# Patient Record
Sex: Male | Born: 1945 | Race: White | Hispanic: No | Marital: Married | Smoking: Never smoker
Health system: Southern US, Community
[De-identification: ages and names within clinical notes are randomized; demographics above are authoritative.]

## PROBLEM LIST (undated history)

## (undated) DIAGNOSIS — K219 Gastro-esophageal reflux disease without esophagitis: Secondary | ICD-10-CM

## (undated) DIAGNOSIS — G8929 Other chronic pain: Secondary | ICD-10-CM

## (undated) DIAGNOSIS — C4491 Basal cell carcinoma of skin, unspecified: Secondary | ICD-10-CM

## (undated) DIAGNOSIS — K5790 Diverticulosis of intestine, part unspecified, without perforation or abscess without bleeding: Secondary | ICD-10-CM

## (undated) DIAGNOSIS — M25562 Pain in left knee: Secondary | ICD-10-CM

## (undated) DIAGNOSIS — Z96659 Presence of unspecified artificial knee joint: Secondary | ICD-10-CM

## (undated) DIAGNOSIS — R0789 Other chest pain: Secondary | ICD-10-CM

## (undated) DIAGNOSIS — I10 Essential (primary) hypertension: Secondary | ICD-10-CM

## (undated) DIAGNOSIS — E785 Hyperlipidemia, unspecified: Secondary | ICD-10-CM

## (undated) DIAGNOSIS — N401 Enlarged prostate with lower urinary tract symptoms: Secondary | ICD-10-CM

## (undated) DIAGNOSIS — M1990 Unspecified osteoarthritis, unspecified site: Secondary | ICD-10-CM

## (undated) DIAGNOSIS — K648 Other hemorrhoids: Secondary | ICD-10-CM

## (undated) DIAGNOSIS — G4733 Obstructive sleep apnea (adult) (pediatric): Secondary | ICD-10-CM

## (undated) DIAGNOSIS — M25561 Pain in right knee: Secondary | ICD-10-CM

## (undated) DIAGNOSIS — E291 Testicular hypofunction: Secondary | ICD-10-CM

## (undated) DIAGNOSIS — G894 Chronic pain syndrome: Secondary | ICD-10-CM

## (undated) DIAGNOSIS — I480 Paroxysmal atrial fibrillation: Secondary | ICD-10-CM

## (undated) DIAGNOSIS — M1712 Unilateral primary osteoarthritis, left knee: Secondary | ICD-10-CM

## (undated) DIAGNOSIS — M545 Low back pain: Secondary | ICD-10-CM

## (undated) DIAGNOSIS — Z96652 Presence of left artificial knee joint: Secondary | ICD-10-CM

## (undated) DIAGNOSIS — M47816 Spondylosis without myelopathy or radiculopathy, lumbar region: Secondary | ICD-10-CM

## (undated) DIAGNOSIS — R519 Headache, unspecified: Secondary | ICD-10-CM

## (undated) DIAGNOSIS — R51 Headache: Secondary | ICD-10-CM

## (undated) DIAGNOSIS — N138 Other obstructive and reflux uropathy: Secondary | ICD-10-CM

## (undated) DIAGNOSIS — G43909 Migraine, unspecified, not intractable, without status migrainosus: Secondary | ICD-10-CM

## (undated) DIAGNOSIS — R05 Cough: Secondary | ICD-10-CM

## (undated) HISTORY — DX: Testicular hypofunction: E29.1

## (undated) HISTORY — DX: Pain in right knee: M25.561

## (undated) HISTORY — DX: Presence of unspecified artificial knee joint: Z96.659

## (undated) HISTORY — DX: Benign prostatic hyperplasia with lower urinary tract symptoms: N13.8

## (undated) HISTORY — PX: OTHER SURGICAL HISTORY: SHX169

## (undated) HISTORY — DX: Other chronic pain: G89.29

## (undated) HISTORY — DX: Other obstructive and reflux uropathy: N40.1

## (undated) HISTORY — DX: Other chest pain: R07.89

## (undated) HISTORY — DX: Unilateral primary osteoarthritis, left knee: M17.12

## (undated) HISTORY — PX: JOINT REPLACEMENT: SHX530

## (undated) HISTORY — DX: Hyperlipidemia, unspecified: E78.5

## (undated) HISTORY — PX: CARPAL TUNNEL RELEASE: SHX101

## (undated) HISTORY — DX: Spondylosis without myelopathy or radiculopathy, lumbar region: M47.816

## (undated) HISTORY — DX: Paroxysmal atrial fibrillation: I48.0

## (undated) HISTORY — PX: SHOULDER SURGERY: SHX246

## (undated) HISTORY — DX: Gastro-esophageal reflux disease without esophagitis: K21.9

## (undated) HISTORY — PX: KNEE SURGERY: SHX244

## (undated) HISTORY — DX: Pain in left knee: M25.562

## (undated) HISTORY — DX: Other hemorrhoids: K64.8

## (undated) HISTORY — PX: KNEE ARTHROPLASTY: SHX992

## (undated) HISTORY — DX: Cough: R05

## (undated) HISTORY — DX: Low back pain: M54.5

## (undated) HISTORY — DX: Diverticulosis of intestine, part unspecified, without perforation or abscess without bleeding: K57.90

## (undated) HISTORY — DX: Basal cell carcinoma of skin, unspecified: C44.91

## (undated) HISTORY — DX: Chronic pain syndrome: G89.4

## (undated) HISTORY — DX: Unspecified osteoarthritis, unspecified site: M19.90

## (undated) HISTORY — PX: HIP ARTHROPLASTY: SHX981

## (undated) HISTORY — PX: TOTAL KNEE ARTHROPLASTY: SHX125

## (undated) HISTORY — DX: Presence of left artificial knee joint: Z96.652

## (undated) HISTORY — DX: Obstructive sleep apnea (adult) (pediatric): G47.33

## (undated) HISTORY — DX: Migraine, unspecified, not intractable, without status migrainosus: G43.909

---

## 1998-06-17 ENCOUNTER — Other Ambulatory Visit: Admission: RE | Admit: 1998-06-17 | Discharge: 1998-06-17 | Payer: Self-pay | Admitting: Dermatology

## 2002-07-22 ENCOUNTER — Ambulatory Visit (HOSPITAL_BASED_OUTPATIENT_CLINIC_OR_DEPARTMENT_OTHER): Admission: RE | Admit: 2002-07-22 | Discharge: 2002-07-22 | Payer: Self-pay | Admitting: Orthopaedic Surgery

## 2003-10-05 ENCOUNTER — Ambulatory Visit (HOSPITAL_COMMUNITY): Admission: RE | Admit: 2003-10-05 | Discharge: 2003-10-05 | Payer: Self-pay | Admitting: Orthopaedic Surgery

## 2003-10-05 ENCOUNTER — Encounter: Payer: Self-pay | Admitting: Orthopaedic Surgery

## 2004-06-01 ENCOUNTER — Encounter: Admission: RE | Admit: 2004-06-01 | Discharge: 2004-06-01 | Payer: Self-pay | Admitting: Orthopaedic Surgery

## 2004-10-18 ENCOUNTER — Inpatient Hospital Stay (HOSPITAL_COMMUNITY): Admission: RE | Admit: 2004-10-18 | Discharge: 2004-10-21 | Payer: Self-pay | Admitting: Orthopaedic Surgery

## 2005-04-20 ENCOUNTER — Ambulatory Visit: Payer: Self-pay | Admitting: Internal Medicine

## 2005-05-04 ENCOUNTER — Ambulatory Visit: Payer: Self-pay | Admitting: Internal Medicine

## 2006-10-18 ENCOUNTER — Ambulatory Visit: Admission: RE | Admit: 2006-10-18 | Discharge: 2006-10-18 | Payer: Self-pay | Admitting: Orthopaedic Surgery

## 2006-11-02 ENCOUNTER — Inpatient Hospital Stay (HOSPITAL_COMMUNITY): Admission: RE | Admit: 2006-11-02 | Discharge: 2006-11-05 | Payer: Self-pay | Admitting: Orthopaedic Surgery

## 2007-04-01 ENCOUNTER — Encounter: Admission: RE | Admit: 2007-04-01 | Discharge: 2007-04-01 | Payer: Self-pay | Admitting: Orthopaedic Surgery

## 2010-04-25 ENCOUNTER — Encounter (INDEPENDENT_AMBULATORY_CARE_PROVIDER_SITE_OTHER): Payer: Self-pay | Admitting: *Deleted

## 2010-06-14 ENCOUNTER — Encounter (INDEPENDENT_AMBULATORY_CARE_PROVIDER_SITE_OTHER): Payer: Self-pay | Admitting: *Deleted

## 2010-06-17 ENCOUNTER — Ambulatory Visit: Payer: Self-pay | Admitting: Internal Medicine

## 2010-06-21 ENCOUNTER — Ambulatory Visit: Payer: Self-pay | Admitting: Internal Medicine

## 2011-01-24 NOTE — Letter (Signed)
Summary: Colonoscopy Letter  [facility]  [address] [place], [place] [address]   Phone: [phone]  Fax: [phone]      Apr 25, 2010 MRN: [id number]   Alan Knight [address] [place], [place]  [address]   Dear Mr. Vining,   According to your medical record, it is time for you to schedule a Colonoscopy. The American Cancer Society recommends this procedure as a method to detect early colon cancer. Patients with a family history of colon cancer, or a personal history of colon polyps or inflammatory bowel disease are at increased risk.  This letter has beeen generated based on the recommendations made at the time of your procedure. If you feel that in your particular situation this may no longer apply, please contact our office.  Please call our office at [phone] to schedule this appointment or to update your records at your earliest convenience.  Thank you for cooperating with [place] to provide you with the very best care possible.   Sincerely,    Yancey Flemings, M.D.  [facility] [phone]

## 2011-01-24 NOTE — Letter (Signed)
Summary: [facility] Instructions  [facility]  [address] [place], [place] [address]   Phone: [phone]  Fax: [phone]       Alan Knight    Jul 16, 1946    MRN: [id number]        Procedure Day Dorna Bloom:  Alan Knight  06/21/10     Arrival Time: 8:30am     Procedure Time: 9:30am     Location of Procedure:                    _X _  [facility] (4th Floor)                        PREPARATION FOR COLONOSCOPY WITH MOVIPREP   Starting 5 days prior to your procedure  THURSDAY 06/16/10  do not eat nuts, seeds, popcorn, corn, beans, peas,  salads, or any raw vegetables.  Do not take any fiber supplements (e.g. Metamucil, Citrucel, and Benefiber).  THE DAY BEFORE YOUR PROCEDURE         DATE:  MONDAY  06/20/10  1.  Drink clear liquids the entire day-NO SOLID FOOD  2.  Do not drink anything colored red or purple.  Avoid juices with pulp.  No orange juice.  3.  Drink at least 64 oz. (8 glasses) of fluid/clear liquids during the day to prevent dehydration and help the prep work efficiently.  CLEAR LIQUIDS INCLUDE: Water Jello Ice Popsicles Tea (sugar ok, no milk/cream) Powdered fruit flavored drinks Coffee (sugar ok, no milk/cream) Gatorade Juice: apple, white grape, white cranberry  Lemonade Clear bullion, consomm, broth Carbonated beverages (any kind) Strained chicken noodle soup Hard Candy                             4.  In the morning, mix first dose of MoviPrep solution:    Empty 1 Pouch A and 1 Pouch B into the disposable container    Add lukewarm drinking water to the top line of the container. Mix to dissolve    Refrigerate (mixed solution should be used within 24 hrs)  5.  Begin drinking the prep at 5:00 p.m. The MoviPrep container is divided by 4 marks.   Every 15 minutes drink the solution down to the next mark (approximately 8 oz) until the full liter is complete.   6.  Follow completed prep with 16 oz of clear liquid of your choice  (Nothing red or purple).  Continue to drink clear liquids until bedtime.  7.  Before going to bed, mix second dose of MoviPrep solution:    Empty 1 Pouch A and 1 Pouch B into the disposable container    Add lukewarm drinking water to the top line of the container. Mix to dissolve    Refrigerate  THE DAY OF YOUR PROCEDURE      DATE: TUESDAY  06/21/10  Beginning at  4:30 a.m. (5 hours before procedure):         1. Every 15 minutes, drink the solution down to the next mark (approx 8 oz) until the full liter is complete.  2. Follow completed prep with 16 oz. of clear liquid of your choice.    3. You may drink clear liquids until  7:30am  (2 HOURS BEFORE PROCEDURE).   MEDICATION INSTRUCTIONS  Unless otherwise instructed, you should take regular prescription medications with a small sip of water   as early  as possible the morning of your procedure.           OTHER INSTRUCTIONS  You will need a responsible adult at least 65 years of age to accompany you and drive you home.   This person must remain in the waiting room during your procedure.  Wear loose fitting clothing that is easily removed.  Leave jewelry and other valuables at home.  However, you may wish to bring a book to read or  an iPod/MP3 player to listen to music as you wait for your procedure to start.  Remove all body piercing jewelry and leave at home.  Total time from sign-in until discharge is approximately 2-3 hours.  You should go home directly after your procedure and rest.  You can resume normal activities the  day after your procedure.  The day of your procedure you should not:   Drive   Make legal decisions   Operate machinery   Drink alcohol   Return to work  You will receive specific instructions about eating, activities and medications before you leave.    The above instructions have been reviewed and explained to me by   Wyona Almas RN  June 17, 2010 11:44 AM    I fully  understand and can verbalize these instructions _____________________________ Date _________

## 2011-01-24 NOTE — Miscellaneous (Signed)
Summary: [facility] Previsit/prep  Clinical Lists Changes  Medications: Added new medication of MOVIPREP 100 GM  SOLR (PEG-KCL-NACL-NASULF-NA ASC-C) As per prep instructions. - Signed Rx of MOVIPREP 100 GM  SOLR (PEG-KCL-NACL-NASULF-NA ASC-C) As per prep instructions.;  #1 x 0;  Signed;  Entered by: Wyona Almas RN;  Authorized by: Hilarie Fredrickson MD;  Method used: Electronically to [facility] [phone] [place], [place], [place]  [address], Ph: [phone], Fax: [phone] Observations: Added new observation of NKA: T (06/17/2010 10:53)    Prescriptions: MOVIPREP 100 GM  SOLR (PEG-KCL-NACL-NASULF-NA ASC-C) As per prep instructions.  #1 x 0   Entered by:   Wyona Almas RN   Authorized by:   Hilarie Fredrickson MD   Signed by:   Wyona Almas RN on 06/17/2010   Method used:   Electronically to        [facility] [address] (retail)       [phone]       [place], [place]  [address]       Ph: [phone]       Fax: [phone]   RxID:   [id number]

## 2011-01-24 NOTE — Procedures (Signed)
Summary: Colonoscopy  Patient: Alan Knight Note: All result statuses are Final unless otherwise noted.  Tests: (1) Colonoscopy (COL)   COL Colonoscopy           DONE     [facility]     [address]     [place], [place]  [address]           COLONOSCOPY PROCEDURE REPORT           PATIENT:  Alan Knight, Alan Knight  MR#:  [id number]     BIRTHDATE:  1946/08/01, 63 yrs. old  GENDER:  male     ENDOSCOPIST:  Wilhemina Bonito. Eda Keys, MD     REF. BY:  Screening Recall     PROCEDURE DATE:  06/21/2010     PROCEDURE:  Higher-risk screening colonoscopy G0105           ASA CLASS:  Class II     INDICATIONS:  Elevated Risk Screening, family history of colon     cancer (uncle)     MEDICATIONS:   Fentanyl 75 mcg IV, Versed 9 mg IV           DESCRIPTION OF PROCEDURE:   After the risks benefits and     alternatives of the procedure were thoroughly explained, informed     consent was obtained.  Digital rectal exam was performed and     revealed no abnormalities.   The LB CF-[id number] endoscope     was introduced through the anus and advanced to the cecum, which     was identified by both the appendix and ileocecal valve, without     limitations.Time to cecum = 2:35 min.  The quality of the prep was     excellent, using MoviPrep.  The instrument was then slowly     withdrawn (time = 17:36 min) as the colon was fully examined.     <<PROCEDUREIMAGES>>           FINDINGS:  Moderate diverticulosis was found in the sigmoid colon     with a few ascending diverticula present.  This was otherwise a     normal examination of the colon.  No polyps or cancers were seen.     Retroflexed views in the rectum revealed small internal     hemorrhoids.    The scope was then withdrawn from the patient and     the procedure completed.           COMPLICATIONS:  None     ENDOSCOPIC IMPRESSION:     1) Moderate diverticulosis in the sigmoid colon (and a few     ascending)     2) Otherwise normal examination     3) No  polyps or cancers     4) Small Internal hemorrhoids           RECOMMENDATIONS:     1) Continue current colorectal screening recommendations  with a     repeat colonoscopy in 10 years.           ______________________________     Wilhemina Bonito. Eda Keys, MD           CC:  Adrian Prince, MD; The Patient           n.     eSIGNED:   Wilhemina Bonito. Eda Keys at 06/21/2010 10:22 AM           Ermelinda Das, [id number]  Note: An exclamation mark (!) indicates a result that was  not dispersed into the flowsheet. Document Creation Date: 06/21/2010 10:22 AM _______________________________________________________________________  (1) Order result status: Final Collection or observation date-time: 06/21/2010 10:15 Requested date-time:  Receipt date-time:  Reported date-time:  Referring Physician:   Ordering Physician: Fransico Setters [phone]) Specimen Source:  Source: Launa Grill Order Number: [id number] Lab site:   Appended Document: Colonoscopy     Procedures Next Due Date:    Colonoscopy: 05/2020

## 2011-05-12 NOTE — Discharge Summary (Signed)
NAME:  Alan, Knight NO.:  [id number]   MEDICAL RECORD NO.:  [id number]          PATIENT TYPE:  INP   LOCATION:  5022                         FACILITY:  [facility]   PHYSICIAN:  Claude Manges. Whitfield, M.D.DATE OF BIRTH:  1946-04-02   DATE OF ADMISSION:  10/18/2004  DATE OF DISCHARGE:  10/21/2004                                 DISCHARGE SUMMARY   ADMISSION DIAGNOSES:  1.  End-stage osteoarthritis, right hip.  2.  Dyslipidemia.  3.  Osteoarthritis, bilateral knees, right worse than left.  4.  History of bilateral rotator cuff repairs.   DISCHARGE DIAGNOSES:  1.  End-stage osteoarthritis, right hip, status post right total hip      arthroplasty.  2.  Acute blood loss anemia secondary to surgery.  3.  Dyslipidemia.  4.  Osteoarthritis of bilateral knees.  5.  History of bilateral rotator cuff repairs.   SURGICAL PROCEDURE:  On October 18, 2004, Alan Knight underwent a right total  hip arthroplasty by Dr. Claude Manges. Whitfield and Dr. Lenard Galloway. Mortenson,  assisted by Jamelle Rushing, P.A.  He had  Pinnacle 100-series acetabular  cup, size 54 mm, with a Pinnacle Marathon acetabular liner, lipped, 54-mm  outer diameter, 32-mm inner diameter, an apex hole eliminator, then an AML  large-stature 160-mm length, 45-mm off-set, 15 size femoral stem with an  Articules femoral head, 32-mm +5 neck, 12/14 cone.   COMPLICATIONS:  None.   CONSULTS:  1.  Pharmacy consult for Coumadin therapy, October 18, 2004.  2.  Physical Therapy and Case Management consult, October 19, 2004.   HISTORY OF PRESENT ILLNESS:  This 65 year old white male patient presented  to Dr. Cleophas Dunker with right hip pain for the 8-10 months.  It is a constant  dull-to-sharp pain that increases with walking or going up and down stairs.  It does keep him up at night.  He has had no known injury.  He has failed  conservative treatment for the hip and x-rays show end-stage changes in that  joint; because of this, he  is presenting for a right hip replacement.   HOSPITAL COURSE:  Alan Knight tolerated his surgical procedure well without  immediate postoperative complications.  He was transferred to 5000.  Postop  day 1, he was afebrile and vitals stable.  Pain was well-controlled with  minimal medications.  He was started on therapy per protocol.  He did well  over the next several days and made good progress with therapy.  The  hemoglobin stabilized at 10.9 with hematocrit of 31.6.  He remained  afebrile, pain controlled with minimal medications.  His INR is not yet  therapeutic on Coumadin with an INR only of 1.2 on October 21, 2004, but he  is doing well enough that it is felt he is ready for discharge home and he  will be discharged home later today.   DISCHARGE INSTRUCTIONS:   DIET:  He can resume his pre-hospitalization diet.   MEDICATIONS:  He may resume his home medications, except no aspirin or  Naprosyn while on the Coumadin.  Home medications  included:  1.  Vytorin 10/10 mg p.o. q.a.m.  2.  Vitamin E 1000 mg p.o. q.a.m.  3.  Vitamin C 1000 mg p.o. q.a.m.  4.  Centrum Silver 1 tablet p.o. q.a.m.  5.  Fish oil 1000 mg p.o. q.a.m.  6.  Calcium plus D 600 mg p.o. q.a.m.  7.  Vitamin B 600 mg p.o. q.a.m.  8.  Chondroitin and glucosamine 1500/1200 mg p.o. q.a.m.   Additional medications at this time include:  1.  Coumadin taken as directed by pharmacy at 6 p.m. each day x1 month.      Pharmacy will dictate the dose.  2.  Percocet 5/325 mg 1-2 tablets p.o. q.4 h. p.r.n. for pain, 50 with no      refill.   ACTIVITY:  He can be out of bed partial weightbearing 50% or less on the  right leg with use of the walker.  He is to have PT and OT per [facility].  Please see the blue total hip discharge sheet for further  activity instructions.   WOUND CARE:  He needs to keep his right hip incision clean and dry and may  shower after no drainage from the wound for 2 days.  Please see  the blue  total hip discharge sheet for further wound care instructions.   FOLLOWUP:  He needs to follow up with Dr. Cleophas Dunker in our office in about  10-12 days and needs to call [phone] for that appointment.   LABORATORY DATA:  Chest x-ray done on October 12, 2004 showed mild pulmonary  hyperinflation, no acute disease.   On October 19, 2004, hemoglobin was 12.1, hematocrit 34.4, on October 20, 2004, hemoglobin 11.4, hematocrit 32.8 and on October 21, 2004, white count  6.4, hemoglobin 10.9, hematocrit 31.6 and platelets 169,000.  On October 19, 2004, sodium 134, potassium 4 and glucose 113.  On October 19, 2004, PT  14.1, INR 1.1 and on October 21, 2004, PT 14.8, INR 1.2.  Urinalysis done on  October 12, 2004 showed 50 mg/dl of ketones; all other indices within normal  limits.  All other laboratory studies were within normal limits.      KED/MEDQ  D:  10/21/2004  T:  10/21/2004  Job:  [id number]   cc:   Jeannett Senior A. Evlyn Kanner, M.D.  [address]  [place]  [place] [address]  Fax: [phone]

## 2011-05-12 NOTE — H&P (Signed)
NAME:  Alan Knight, Alan Knight NO.:  [id number]   MEDICAL RECORD NO.:  [id number]          PATIENT TYPE:  INP   LOCATION:  NA                           FACILITY:  [facility]   PHYSICIAN:  Alan Knight, M.D.DATE OF BIRTH:  [birth year]   DATE OF ADMISSION:  10/18/2004  DATE OF DISCHARGE:                                HISTORY & PHYSICAL   CHIEF COMPLAINT:  Right hip pain.   HISTORY OF PRESENT ILLNESS:  Alan Knight is a 65 year old white male with  right hip pain for at least the past eight to 10 months.  The pain in the  right hip is described as a constant dull-sharp pain.  The pain is made  worse by walking and going up or down stairs.  Initially the patient notes  that he is having trouble donning his socks.  He is kept awake by the pain  in his right hip at night.  Mechanical symptoms are negative for giving  away.  He feels as if the leg is weak.  He uses no assistive devices to  ambulate.  He had one cortisone in the hip that helped for approximately a  month.  X-rays of the hip show near-bone on bone of the superior femoral  head in the lateral acetabulum.  The femoral head is deformed with large  spurs.  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  The patient  denies any injury to the hip.   PLAN:  The patient to be admitted to [facility] on October 18, 2004, and undergo a right total hip arthroplasty with Dr. Cleophas Knight.   ALLERGIES:  No known drug allergies.   MEDICATIONS:  1.  Vytorin 10/10 mg one daily.  2.  Naproxen 500 mg one daily.  3.  Vitamin E 1000 mg daily.  4.  Vitamin C 1000 mg daily.  5.  Centrum Silver one daily.  6.  Fish oil 1000 mg daily.  7.  Calcium plus D 600 mg daily.  8.  Vitamin B 600 mg one daily.  9.  Aspirin 325 mg daily (stopped on October 12, 2004).  10. Glucosamine/chondroitin 1500/1200 mg daily.   PAST MEDICAL HISTORY:  1.  Dyslipidemia.  2.  History of multiple bilateral knee surgeries.  3.  History of  bilateral shoulder surgeries with rotator cuff repair.   SOCIAL HISTORY:  The patient denies any tobacco use.  He occasionally uses  alcohol.  He is married.  His primary care physician is Alan Knight,  M.D., of [place].  He lives in a two-story home with some steps to the  usual entrance.  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   FAMILY HISTORY:  The patient's mother is alive at age 33 and is healthy.  The patient's father deceased at age 34, involved in a motor vehicle  accident.  He had a history of hypertension and stroke.  He has two  brothers, age 37 and 25, and two sisters age 65 and 19.  All are reported to  be healthy.   REVIEW OF SYSTEMS:  The patient denies any chest pain, shortness of breath,  PND.  He wears glasses for reading.  Otherwise review of systems is  negative.  He does have a living will and has a power of attorney.   PHYSICAL EXAMINATION:  GENERAL:  The patient is a well-developed, well-  nourished male who walks with a limp on the right.  The patient's mood and  affect are appropriate.  He talks easily with the examiner.  VITAL SIGNS:  Height 5 feet 10 inches, weight 195 pounds.  Temperature 97.4  degrees Fahrenheit, pulse is 58, blood pressure is 118/82, respiratory rate  16.  CARDIAC:  Regular rate and rhythm, no murmurs, rubs, or gallops noted.  CHEST:  Lungs clear to auscultation bilaterally without wheezing, rhonchi,  or rales.  ABDOMEN:  Soft, nontender, bowel sounds x4 quadrants, no hepatomegaly, no  splenomegaly.  NECK:  Trachea is midline, no lymphadenopathy.  Carotids are 2+ without  bruits, and no tenderness along the cervical spine with palpation.  He has  full range of motion of the cervical spine without pain.  BACK:  Nontender to palpation over the thoracic and lumbar column.  BREASTS, GENITALIA, URINARY, RECTAL:  Exams all deferred at this time.  HEENT:  Head is normocephalic, atraumatic without trauma or maxillary sinus   tenderness to palpation.  Sclerae are nonicteric bilaterally.  Conjunctivae  are pink.  PERRLA.  EOMs are intact.  No visible external ear deformities  noted.  TMs are pearly gray bilaterally.  Nose:  Nasal septum midline, nasal  mucosa is pink, moist.  Buccal mucosa is pink and moist.  Pharynx is without  erythema or exudate.  Tongue and uvula midline.  NEUROLOGIC:  Cranial nerves II-XII grossly intact.  The patient is alert and  oriented x3.  Deep tendon reflexes unable to elicit in both the upper and  lower extremities bilaterally.  Extremity strength in the upper extremities  reveals 5/5 strength against resistance.  Lower extremities are 5/5  throughout.  MUSCULOSKELETAL:  Upper extremities are equal and symmetric bilaterally.  He  has a good range of motion of the shoulders, wrists, elbows, hands.  Radial  pulses are 2+ bilaterally.  Lower extremities:  Left hip internal rotation  25 degrees, external 35 degrees.  Range of motion is nonpainful.  The right  hip with very limited range of motion, 10 degrees externally and internal  rotation at best.  Anything beyond 10 degrees with internal or external  rotation is painful.  He is able to bring both hips up to around 90 degrees  without any pain.  Right knee:  0-100 degrees of flexion.  He has a moderate  amount of crepitus.  He has a 7 degree valgus deformity of the knee.  Palpation of the joint line reveals lateral compartment tenderness.  Valgus-  varus stressing negative.  Left knee:  0-111 degrees with very mild  crepitus.  He has a 6 degree valgus deformity of the left knee.  Valgus-  varus stressing is negative.  Anterior drawer bilaterally is negative.  Bilaterally there is no effusion or edema noted in either knee.  Lower  extremities are not edematous.  Posterior tibial pulses are 2+ bilaterally.   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   IMPRESSION:  1.  Osteoarthritis of the  right hip.  2.  Dyslipidemia.  3.  Osteoarthritis of both knees, right greater than left.  4.  History of bilateral shoulder rotator cuff repairs.   PLAN:  The patient to be admitted to [facility] on October 25 by  Dr. Cleophas Knight to undergo a right total hip arthroplasty.  The patient will  undergo all preoperative labs and testing prior to surgery.       GC/MEDQ  D:  10/13/2004  T:  10/13/2004  Job:  [id number]

## 2011-05-12 NOTE — Op Note (Signed)
NAME:  Alan Knight, Alan Knight NO.:  [id number]   MEDICAL RECORD NO.:  [id number]          PATIENT TYPE:  INP   LOCATION:  2899                         FACILITY:  [facility]   PHYSICIAN:  Claude Manges. Whitfield, M.D.DATE OF BIRTH:  09-01-1946   DATE OF PROCEDURE:  10/18/2004  DATE OF DISCHARGE:                                 OPERATIVE REPORT   PREOPERATIVE DIAGNOSIS:  End stage osteoarthritis, right hip.   POSTOPERATIVE DIAGNOSIS:  End stage osteoarthritis, right hip.   OPERATION PERFORMED:  Right total hip replacement.   SURGEON:  Claude Manges. Cleophas Dunker, M.D.   ASSISTANT:  1.  Lenard Galloway. Chaney Malling, M.D.  2.  Jamelle Rushing, P.A.   ANESTHESIA:  General orotracheal anesthesia.   COMPLICATIONS:  None.   COMPONENTS:  Depuy AML large stature 15 mm femoral component with a 32 mm  hip ball and a +5 mm neck length.  A 54 mm outer diameter 100 series  acetabular component with the Marathon polyethylene liner 10 degree  posterior lip and Apex hole eliminator.   INDICATIONS FOR PROCEDURE:  With the patient comfortable on the operating  table and under general orotracheal anesthesia, nursing staff inserted a  Foley catheter.  The patient was then placed in the lateral decubitus  position with the right side up and secured to the operating table with the  Innomed hip system.  The right hip was prepped with Betadine scrub and then  DuraPrep from the iliac crest to below the knee.  Sterile draping was  performed.   A routine Southern incision was utilized and via sharp dissection, carried  down through the subcutaneous tissue.  Self-retaining retractors were  inserted.  The iliotibial band was identified and incised along the length  of the incision.  Retractors were placed more deeply.  With the hip  internally rotated, the short external rotators were identified.  The  tendinous structures were tagged with 0 Tycron suture and then incised from  the posterior aspect of the greater  trochanter.  Hip capsule was then  applied and incised on the femoral neck and head.  There was minimal  synovitis and minimal joint fluid.  The hip was then dislocated posteriorly.  The AML guide was used to obtain the appropriate calcar osteotomy.  The head  was then delivered from the wound after incising the ligamentum.   The femur was then prepared.  Retractors then placed about the femur.  A  starter hole was then made in the piriformis fossa.  Reaming was performed  to 14.5 to accept a 15 prosthesis.  The broaches were then inserted  sequentially from a 10.5 to a 12, a 13.5, and then a 15 mm standard  component.  The calcar reamer was obtained to fine tune the calcar  osteotomy.   Retractor was placed about the acetabulum.  The labrum was sharply excised.  Reaming was performed to 53 mm to accept a 54 mm outer diameter prosthesis.  We had an excellent rim fit with the trial.  We had nice bleeding bone.  The  100 series 54 mm  outer diameter acetabular component was then impacted  nicely into the acetabulum in what appeared to be neutral position.  The  trial polyethylene liner was then inserted.  We then trialed the components  reinserting the femoral broach and several head and neck sizes and felt that  the +5 neck length with a 32 mm hip ball was perfectly stable and with  adduction, internal and external rotation, and toggling there was no motion.   Trial components were removed.  We copiously irrigated the joint with  saline.  The acetabulum appeared to be nicely seated.  An Apex hole  eliminator was then inserted followed by the Marathon polyethylene liner  with a 10 degree posterior lip.  The AML 15 mm large stature femoral  component was then impacted flush in the calcar.  We again trialed the +5 mm  neck length and felt that the leg lengths were perfectly symmetrical.  Accordingly, the final hip ball was then inserted 32 mm outer diameter +5 mm  neck length after cleaning  the Morse taper neck.  The entire construct was  then reduced to a full range of motion.  The hip was perfectly stable.  There was no subluxation.  Again, leg lengths appeared to be symmetrical.   The wound was again copiously with saline solution.  The hip capsule was  closed anatomically with 0 Ethibond.  Short external rotators were closed  with similar material.  The iliotibial band was closed with a running 0  Vicryl, the subcu closed in two layers with 0 and 2-0 Vicryl, skin closed  with skin clips.  Sterile bulky dressing was applied.  The patient was then  awakened and returned to the postanesthesia recovery room in satisfactory  condition.       PWW/MEDQ  D:  10/18/2004  T:  10/18/2004  Job:  [id number]

## 2011-05-12 NOTE — Op Note (Signed)
NAME:  Alan Knight, Alan Knight NO.:  [id number]   MEDICAL RECORD NO.:  [id number]                    PATIENT TYPE:   LOCATION:                                       FACILITY:   PHYSICIAN:  Claude Manges. Cleophas Dunker, M.D.            DATE OF BIRTH:   DATE OF PROCEDURE:  07/22/2002  DATE OF DISCHARGE:                                 OPERATIVE REPORT   PREOPERATIVE DIAGNOSES:  1. Tear of the anteroinferior labrum left shoulder.  2. Impingement.   POSTOPERATIVE DIAGNOSES:  1. Tear of biceps tendon.  2. Partial rotator cuff tear.  3. Impingement.   PROCEDURE:  1. Diagnostic arthroscopy, left shoulder.  2. Arthroscopic debridement, biceps tendon and partial rotator cuff tear.  3. Arthroscopic subacromial decompression.   SURGEON:  Claude Manges. Cleophas Dunker, M.D.   ASSISTANT:  Jamelle Rushing, P.A.   ANESTHESIA:  General orotracheal.   COMPLICATIONS:  None.   HISTORY:  This is a 65 year old gentleman who has been experiencing pain on  a recurrent basis of his left shoulder for many months.  He has had a series  of several cortisone injections which tend to last for a month or so and  then he has recurrent pain, specifically when he plays golf, works around  the house or when he lifts weights.  He has evidence of an impingement  syndrome.  There is prominence of the [facility] joint but without pain and he has  had some pain along the anterior aspect of his shoulder referred to the  biceps.  An MRI scan was performed revealing an anteroinferior labral tear.  The biceps tendon was located within the intertubercular groove and it was  somewhat attenuated but otherwise appeared to be intact.  He is now having  arthroscopic evaluation.   DESCRIPTION OF PROCEDURE:  The patient was comfortable on the operating room  table and under general orotracheal anesthesia.  The patient was placed in a  semi sitting position.  The left shoulder was then prepped with DuraPrep  from the base  of the neck to below the elbow.  Sterile draping was  performed.  A marking pencil was used to outline the acromion and AC joint  and the coracoid at a point a fingerbreadth inferior and medial to the  posterior angle of the acromion, a small stab wound was made.  At this  point, 0.25% Marcaine with epinephrine was injected.  The arthroscope was  easily placed into the shoulder.   Diagnostic arthroscopy revealed considerable fraying of soft tissue within  the joint and it was difficult to evaluate so a second portal was  established anteriorly with a smooth plastic obturator, first directing it  with a spinal needle.  The shaver was introduced and as I shaved the frayed  material, it was obvious that it was arising from the biceps tendon.  As I  debrided it, it  was obvious that there were very few fibers of the biceps  tendon remaining. The biceps tendon was then released.  There was probably 1  cm of stump and this was debrided back to its origin.   The glenoid ligament appeared to be intact, it was slightly frayed and this  was shaved but, it was intact up to the biceps stump.  There was a partial  tear of the rotator cuff involving predominantly supraspinatus at its  attachment of the humeral head and this was debrided as well.  A full  thickness tear was not identified.  I thought the joint looked perfectly  okay.  I did not perform a Hitchcock procedure to the end of the biceps as  he has had a rupture on the opposite side without any functional loss.   The arthroscope was then placed in the subacromial space posteriorly.  The  cannula was placed through the subacromial space anteriorly and a third  portal was established laterally.  An arthroscopic subacromial decompression  was performed removing considerable bursal tissue with the ArthroCare wand  and with the shaver.  There was obvious impingement with the spur beneath  the anterior acromion.  A 6 mm bur was introduced and the  anterior acromion  was then resected so that it was perfectly parallel with the bursal surface  and there was no further impingement.  There was some bleeding anteriorly  which I cauterized.   The three stab wounds were then infiltrated with 0.25% Marcaine with  epinephrine.  Each of them were closed with 4-0 Ethilon and a sterile bulky  dressing was applied, followed by a sling.  The patient tolerated the  procedure without complications.   PLAN:  1. Percocet for pain.  2. Office in one week.                                                  Claude Manges. Cleophas Dunker, M.D.    PWW/MEDQ  D:  07/22/2002  T:  07/24/2002  Job:  [id number]

## 2011-05-12 NOTE — Op Note (Signed)
NAME:  VALENTE, FOSBERG NO.:  [id number]   MEDICAL RECORD NO.:  [id number]          PATIENT TYPE:  INP   LOCATION:  5021                         FACILITY:  [facility]   PHYSICIAN:  Claude Manges. Whitfield, M.D.DATE OF BIRTH:  14-Oct-1946   DATE OF PROCEDURE:  11/02/2006  DATE OF DISCHARGE:                                 OPERATIVE REPORT   PREOPERATIVE DIAGNOSIS:  Osteoarthritis, right knee.   POSTOPERATIVE DIAGNOSIS:  Osteoarthritis, right knee.   PROCEDURE:  Right total knee with computer navigation.   SURGEON:  Claude Manges. Cleophas Dunker, M.D.   ASSISTANT:  Richardean Canal, P.A.   ANESTHESIA:  General with supplemental femoral nerve block.   COMPLICATIONS:  None.   COMPONENTS:  DePuy LCS large femoral component.  A #4 rotating keeled tibial  component with a 10 mm bridging bearing and a metal-backed rotating three-  pegged patella.  All components were secured with polymethyl methacrylate.   PROCEDURE:  With the patient comfortable on the operating table and under  general orotracheal anesthesia, the nursing staff inserted a Foley catheter.  The right lower extremity was then prepped with Betadine scrub and then  DuraPrep from the thigh tourniquet to the midfoot.  Sterile draping was  performed.  With the extremity still elevated, it was Esmarch-exsanguinated  with a proximal tourniquet at 350 mmHg.   A midline longitudinal incision was made centered about the patella  extending from the superior pouch to the tibial tubercle.  Via sharp  dissection the incision was taken down through the subcutaneous tissue.  The  first layer of capsule was incised in the midline.  A medial parapatellar  incision was then the  Bovie.  There was a small clear, yellow joint  effusion.   After completion of the deep incision, the patella was everted 180 degrees  and the knee flexed to 90 degrees.  There very large osteophytes off the  medial and lateral femoral condyle and particularly off  the patella.  These  were removed.  We did template a large femoral component preoperatively.  This was confirmed with the template and via the computer.   At this point the computer arrays were applied, two Shantz pins were placed  in the proximal tibia and two in the distal femur, and the computer arrays  were then attached to them.  Computer points were then morphed to obtain the  anatomic alignment of the right lower extremity and then to formally size  the tibial and femoral components.  Using the computer-based points, the  initial cut was then made on the tibia at about a 7 degree posterior angle.  Subsequent cuts were then made on the femur based on the computer points.  ACL and PCL were sacrificed, as were medial and lateral menisci.  We did  after making the formal femoral tibial cuts check to be sure that we had  symmetrical flexion-extension gaps, and we did with a 10 mm bridging  bearing.  There was no opening with a varus or valgus stress.  Each of our  cuts and subsequent alignment was within 3  degrees of normal anatomical  alignment.  Final femoral cut was then made with the oblique osteotomies in  the femur.  Laminar spreaders were inserted into the joint to remove any  osteophytes in the medial or lateral femoral condyle and any remnants of ACL  and PCL.  There were few spurs identified along the posterior aspect of the  tibial tray, which were also resected.  A retractor was then placed about  the tibia.  We measured a #4 rotating platform.  The center hole was then  made with a guide, followed by the keeled cut guide.  The tibial tray was  left in place with a 10 mm bridging bearing and then the trial femoral  component was applied.  The computer relates that we had full extension, no  opening with a varus or valgus stress, in about 2 degrees of valgus.   The patella was then prepared by removing 12 mm of thickness, leaving 13 mm  of patella.  The three holes were then  made with the jig and the trial  patella was applied through a full range of motion.  There was no  subluxation.   The trial components were removed.  We copiously irrigated the joint with  saline.  The final components were then secured with polymethyl  methacrylate.  The knee placed in full extension.  The patella was applied  with a patellar clamp.  Any extraneous methacrylate was removed with the  Cisco.  After complete maturation at about 14 minutes. the joint was  explored.  We did not see any remaining extraneous methacrylate.  The joint  was then again irrigated with saline solution.  The tourniquet was deflated  with immediate capillary refill to the bleeding surfaces.  Bone wax was  applied to bleeding bone.  A Hemovac was inserted.  The deep capsule was  closed with interrupted #1 Ethibond, superficial capsule closed with a  running 0 Vicryl, skin closed with skin clips.  Sterile bulky dressing was  applied, followed by an Ace bandage.   The patient tolerated the procedure without complications.      Claude Manges. Cleophas Dunker, M.D.  Electronically Signed     PWW/MEDQ  D:  11/02/2006  T:  11/03/2006  Job:  [id number]

## 2011-05-12 NOTE — Discharge Summary (Signed)
NAME:  Alan Knight, Alan Knight NO.:  [id number]   MEDICAL RECORD NO.:  [id number]          PATIENT TYPE:  INP   LOCATION:  5021                         FACILITY:  [facility]   PHYSICIAN:  Claude Manges. Whitfield, M.D.DATE OF BIRTH:  [birth year]   DATE OF ADMISSION:  11/02/2006  DATE OF DISCHARGE:  11/05/2006                                 DISCHARGE SUMMARY   ADMISSION DIAGNOSIS:  Osteoarthritis of the right knee.   DISCHARGE DIAGNOSES:  1. Osteoarthritis of the right knee.  2. Hypercholesterolemia.   PROCEDURE:  Right total knee arthroplasty.   HISTORY:  This is a 65 year old white male with a longstanding history of  right knee pain.  He has had 8 operative interventions on the right knee.  He now has a constant, extremely severe right knee pain, which is worsening.  This also wakes him at night.  He has radiographic end-stage osteoarthritis  of the right knee.  Indicated now for a right total knee arthroplasty.   HOSPITAL COURSE:  [age]-year-old white male admitted October 02, 2006  after appropriate laboratory studies were obtained, as well as 1 gram of  Ancef IV on-call in the operating room, was taken to the operating room  where he underwent a right total knee arthroplasty by Dr. Cleophas Dunker,  assistant Rexene Edison.  A number 4 rotating kiln tibial component with a 10 mm  bridging bearing, as well as a Depew LCS large femoral component was used.  He tolerated the procedure well.  A Foley was placed intraoperatively.  He  was given heparin 3000 units preoperatively.  He was then started on heparin  5000 units subcu q.12 hours until his Coumadin became therapeutic.  Placed  on a Dilaudid PCA pump for pain management.  Consultation with PT, OT and  care management were made, 50% weight bearing.  CPM was started from 0 to 45  degrees.  He was out of bed and to a chair the following day.  He was weaned  off his PCA to oral pain medicines.  He apparently had some problems  with  his Percocet, this was discontinued and he was placed on Vicodin and then  this was discontinued.  He was placed on Dilaudid and then this was  discontinued, he was placed back on his Percocet.  This all occurred on the  11th of November.  The remainder of his hospital course was uneventful.  Once he was ambulatory and stable, it was felt that he was ready for  discharge.  He was then discharged in stable condition on the 12th of  November.   LABORATORY DATA:  Admitted with a hemoglobin of 15.8, hematocrit of 46.7%,  white count 7100, platelets 222,000.  Discharge hemoglobin 11.5, hematocrit  33.7%, white count 8100, platelets 185,000.  Preop chemistries:  Sodium 137,  potassium 4.8, chloride 103, CO2 29, glucose 97, BUN 25, creatinine 0.8,  calcium 9.3, total protein 6.3, albumin 3.6, AST 24, ALT 20, ALP 79, total  bilirubin 1.0.  Discharge sodium 137, potassium 3.6, chloride 102, CO2 29,  glucose 112, BUN 9, creatinine 0.6.  Blood type was A/B positive.  Antibody  screen positive for anti-E.  EKG from October 18, 2006, revealed marked  sinus bradycardia; otherwise, no other interpretation.  No radiographic  studies were on the chart at the time of this dictation.   DISCHARGE INSTRUCTIONS:  He is to resume a house diet with no restrictions.  No driving or lifting for 6 weeks.  Crutches and weight bearing as taught at  50% body weight.  Keep his wound clean and dry and cover daily with  dressing.  Prescriptions that were given was Percocet 5/325 one to two tabs  every 4 hours as needed for pain.  Coumadin 5 mg one tab Monday and Tuesday  night at 6 p.m., then wait for advance direct Wednesday dose and  further doses.  He will continue with the discharge medications, as per  reconciliation.  Advance per home health care.  CPM 0 to 50 degrees,  increasing daily by 5 to 10 degrees.  He will follow back up with Dr.  Cleophas Dunker 2 weeks postoperatively.  He was discharged in improved  condition.      Oris Drone Petrarca, P.A.-C.      Claude Manges. Cleophas Dunker, M.D.  Electronically Signed    BDP/MEDQ  D:  11/05/2006  T:  11/06/2006  Job:  [id number]

## 2013-01-16 ENCOUNTER — Ambulatory Visit (INDEPENDENT_AMBULATORY_CARE_PROVIDER_SITE_OTHER): Payer: Medicare Other | Admitting: Internal Medicine

## 2013-01-16 ENCOUNTER — Encounter: Payer: Self-pay | Admitting: Internal Medicine

## 2013-01-16 VITALS — BP 124/80 | HR 60 | Ht 70.0 in | Wt 174.0 lb

## 2013-01-16 DIAGNOSIS — R141 Gas pain: Secondary | ICD-10-CM

## 2013-01-16 DIAGNOSIS — R143 Flatulence: Secondary | ICD-10-CM

## 2013-01-16 DIAGNOSIS — R198 Other specified symptoms and signs involving the digestive system and abdomen: Secondary | ICD-10-CM

## 2013-01-16 NOTE — Progress Notes (Signed)
HISTORY OF PRESENT ILLNESS:  Alan Knight is a 67 y.o. male with a past medical history as outlined below. He presents today regarding remote transient change in bowel habits and chronic problems with increased intestinal gas. Patient underwent complete colonoscopy in May of 2006 and again in June of 2011. The examinations were normal except for the presence of diverticulosis. No neoplasia. He has a family history of colon cancer in an uncle only. Followup in 10 years from his last exam recommended. Patient tells me that he underwent some dental work about 9 or 10 months ago. He did receive some prophylactic antibiotics. Thereafter some transient diarrhea and increased gas which resolved. Seemingly helped with probiotic align. Since that time his bowel habits have been regular without diarrhea. He has continued to have increased intestinal gas as manifested by flatus. GI review of systems is otherwise negative. Specifically, abdominal pain, nausea, change in weight, or bleeding. He is continuing to use probiotics regulate. He has had change in diet over the past year he "more healthy". Seems to have increased fiber and beans. Review of outside blood work over the past year is unremarkable including normal hemoglobin. He wants to be reassured that his problem with gas is nothing to cause concern.  REVIEW OF SYSTEMS:  All non-GI ROS negative except for back pain  Past Medical History  Diagnosis Date  . Hypogonadism male   . OA (osteoarthritis)   . GERD (gastroesophageal reflux disease)   . Basal cell carcinoma   . Hyperlipidemia   . BPH (benign prostatic hypertrophy) with urinary obstruction   . Diverticulosis   . Internal hemorrhoids   . Arthritis     Past Surgical History  Procedure Date  . Shoulder surgery     bilateral  . Knee surgery   . Carpal tunnel release     left  . Total knee arthroplasty     left    Social History Remer Couse  reports that he has never smoked. He does not  have any smokeless tobacco history on file. He reports that he drinks alcohol. He reports that he does not use illicit drugs.  family history includes CVA in his brother and Heart attack in his father.  No Known Allergies     PHYSICAL EXAMINATION: Vital signs: BP 124/80  Pulse 60  Ht 5\' 10"  (1.778 m)  Wt 174 lb (78.926 kg)  BMI 24.97 kg/m2 General: Well-developed, well-nourished, no acute distress HEENT: Sclerae are anicteric, conjunctiva pink. Oral mucosa intact Lungs: Clear Heart: Regular Abdomen: soft, nontender, nondistended, no obvious ascites, no peritoneal signs, normal bowel sounds. No organomegaly. Extremities: No edema Psychiatric: alert and oriented x3. Cooperative    ASSESSMENT:  #1. Increase flatus. No alarm features. Prior negative colonoscopies. Likely related to dietary change #2. Remote transient diarrhea. Antibiotic-related. Resolved. #3. Prior colonoscopies in 2000 06/03/2006 with diverticulosis only  PLAN:  #1. Discussion on gas and flatulence. #2. Literature provided on intestinal gas for his review #3. Anti-gas and flatulence dietary sheet also provided #4. Routine followup screening colonoscopy 2021 #5. Okay to use probiotics on demand if helpful #6. Reassured #7. Interval followup as needed. Return to Dr. Evlyn Kanner

## 2013-01-16 NOTE — Patient Instructions (Addendum)
We have given you some information on gas to review  Please follow up as needed

## 2014-01-14 ENCOUNTER — Other Ambulatory Visit: Payer: Self-pay | Admitting: Orthopaedic Surgery

## 2014-01-14 DIAGNOSIS — M545 Low back pain, unspecified: Secondary | ICD-10-CM

## 2014-01-22 ENCOUNTER — Ambulatory Visit
Admission: RE | Admit: 2014-01-22 | Discharge: 2014-01-22 | Disposition: A | Discharge status: Home / Self Care | Payer: No Typology Code available for payment source | Source: Ambulatory Visit | Attending: Orthopaedic Surgery | Admitting: Orthopaedic Surgery

## 2014-01-22 DIAGNOSIS — M545 Low back pain, unspecified: Secondary | ICD-10-CM

## 2015-03-18 ENCOUNTER — Other Ambulatory Visit: Payer: Self-pay | Admitting: Neurology

## 2015-03-18 DIAGNOSIS — R51 Headache: Secondary | ICD-10-CM

## 2015-03-18 DIAGNOSIS — R519 Headache, unspecified: Secondary | ICD-10-CM

## 2015-03-18 DIAGNOSIS — R42 Dizziness and giddiness: Secondary | ICD-10-CM

## 2015-03-30 ENCOUNTER — Ambulatory Visit
Admission: RE | Admit: 2015-03-30 | Discharge: 2015-03-30 | Disposition: A | Discharge status: Home / Self Care | Payer: PPO | Source: Ambulatory Visit | Attending: Neurology | Admitting: Neurology

## 2015-03-30 DIAGNOSIS — R51 Headache: Secondary | ICD-10-CM

## 2015-03-30 DIAGNOSIS — R42 Dizziness and giddiness: Secondary | ICD-10-CM

## 2015-03-30 DIAGNOSIS — R519 Headache, unspecified: Secondary | ICD-10-CM

## 2015-10-27 NOTE — Pre-Procedure Instructions (Signed)
Alan Knight  10/27/2015      [facility] [id number] [place] [facility], [place] - [address] AT [place] [address] Phone: [phone] Fax: [phone]    Your procedure is scheduled on 11/09/15  Report to [facility] short stay admitting at 815 A.M.  Call this number if you have problems the morning of surgery:  [phone]   Remember:  Do not eat food or drink liquids after midnight.  Take these medicines the morning of surgery with A SIP OF WATER none  STOP all herbel meds, nsaids (aleve,naproxen,advil,ibuprofen) 5 days prior to surgery starting 11/04/15 including aspirin,probiotic,multi vit, vit C, vit D, condroitin, glucosamine-condroitin,coenzyme q10, vit B-12, super vit B-50, fish oil   Do not wear jewelry, make-up or nail polish.  Do not wear lotions, powders, or perfumes.  You may wear deodorant.  Do not shave 48 hours prior to surgery.  Men may shave face and neck.  Do not bring valuables to the hospital.  [facility] is not responsible for any belongings or valuables.  Contacts, dentures or bridgework may not be worn into surgery.  Leave your suitcase in the car.  After surgery it may be brought to your room.  For patients admitted to the hospital, discharge time will be determined by your treatment team.  Patients discharged the day of surgery will not be allowed to drive home.   Name and phone number of your driver:    Special instructions:   Special Instructions: [facility] - Preparing for Surgery  Before surgery, you can play an important role.  Because skin is not sterile, your skin needs to be as free of germs as possible.  You can reduce the number of germs on you skin by washing with CHG (chlorahexidine gluconate) soap before surgery.  CHG is an antiseptic cleaner which kills germs and bonds with the skin to continue killing germs even after washing.  Please DO NOT use if you have an allergy to CHG or  antibacterial soaps.  If your skin becomes reddened/irritated stop using the CHG and inform your nurse when you arrive at Short Stay.  Do not shave (including legs and underarms) for at least 48 hours prior to the first CHG shower.  You may shave your face.  Please follow these instructions carefully:   1.  Shower with CHG Soap the night before surgery and the morning of Surgery.  2.  If you choose to wash your hair, wash your hair first as usual with your normal shampoo.  3.  After you shampoo, rinse your hair and body thoroughly to remove the Shampoo.  4.  Use CHG as you would any other liquid soap.  You can apply chg directly  to the skin and wash gently with scrungie or a clean washcloth.  5.  Apply the CHG Soap to your body ONLY FROM THE NECK DOWN.  Do not use on open wounds or open sores.  Avoid contact with your eyes ears, mouth and genitals (private parts).  Wash genitals (private parts)       with your normal soap.  6.  Wash thoroughly, paying special attention to the area where your surgery will be performed.  7.  Thoroughly rinse your body with warm water from the neck down.  8.  DO NOT shower/wash with your normal soap after using and rinsing off the CHG Soap.  9.  Pat yourself dry with a clean towel.  10.  Wear clean pajamas.            11.  Place clean sheets on your bed the night of your first shower and do not sleep with pets.  Day of Surgery  Do not apply any lotions/deodorants the morning of surgery.  Please wear clean clothes to the hospital/surgery center.  Please read over the following fact sheets that you were given. Pain Booklet, Coughing and Deep Breathing, Blood Transfusion Information, Total Joint Packet, MRSA Information and Surgical Site Infection Prevention

## 2015-10-28 ENCOUNTER — Other Ambulatory Visit: Payer: Self-pay

## 2015-10-28 ENCOUNTER — Encounter (HOSPITAL_COMMUNITY): Payer: Self-pay

## 2015-10-28 ENCOUNTER — Ambulatory Visit (HOSPITAL_COMMUNITY)
Admission: RE | Admit: 2015-10-28 | Discharge: 2015-10-28 | Disposition: A | Discharge status: Home / Self Care | Payer: PPO | Source: Ambulatory Visit | Attending: Orthopedic Surgery | Admitting: Orthopedic Surgery

## 2015-10-28 ENCOUNTER — Encounter (HOSPITAL_COMMUNITY)
Admission: RE | Admit: 2015-10-28 | Discharge: 2015-10-28 | Disposition: A | Discharge status: Home / Self Care | Payer: PPO | Source: Ambulatory Visit | Attending: Orthopaedic Surgery | Admitting: Orthopaedic Surgery

## 2015-10-28 DIAGNOSIS — Z01810 Encounter for preprocedural cardiovascular examination: Secondary | ICD-10-CM | POA: Diagnosis not present

## 2015-10-28 DIAGNOSIS — Z01812 Encounter for preprocedural laboratory examination: Secondary | ICD-10-CM | POA: Diagnosis not present

## 2015-10-28 DIAGNOSIS — Z01818 Encounter for other preprocedural examination: Secondary | ICD-10-CM

## 2015-10-28 HISTORY — DX: Essential (primary) hypertension: I10

## 2015-10-28 LAB — CBC WITH DIFFERENTIAL/PLATELET
Basophils Absolute: 0 10*3/uL (ref 0.0–0.1)
Basophils Relative: 1 %
EOS PCT: 6 %
Eosinophils Absolute: 0.3 10*3/uL (ref 0.0–0.7)
HEMATOCRIT: 44.7 % (ref 39.0–52.0)
Hemoglobin: 15 g/dL (ref 13.0–17.0)
LYMPHS ABS: 1.5 10*3/uL (ref 0.7–4.0)
LYMPHS PCT: 27 %
MCH: 32.1 pg (ref 26.0–34.0)
MCHC: 33.6 g/dL (ref 30.0–36.0)
MCV: 95.5 fL (ref 78.0–100.0)
MONO ABS: 0.4 10*3/uL (ref 0.1–1.0)
MONOS PCT: 8 %
NEUTROS ABS: 3.3 10*3/uL (ref 1.7–7.7)
Neutrophils Relative %: 59 %
PLATELETS: 180 10*3/uL (ref 150–400)
RBC: 4.68 MIL/uL (ref 4.22–5.81)
RDW: 13.9 % (ref 11.5–15.5)
WBC: 5.5 10*3/uL (ref 4.0–10.5)

## 2015-10-28 LAB — COMPREHENSIVE METABOLIC PANEL
ALK PHOS: 60 U/L (ref 38–126)
ALT: 16 U/L — AB (ref 17–63)
AST: 24 U/L (ref 15–41)
Albumin: 3.8 g/dL (ref 3.5–5.0)
Anion gap: 8 (ref 5–15)
BILIRUBIN TOTAL: 1.6 mg/dL — AB (ref 0.3–1.2)
BUN: 36 mg/dL — ABNORMAL HIGH (ref 6–20)
CALCIUM: 9.6 mg/dL (ref 8.9–10.3)
CO2: 29 mmol/L (ref 22–32)
CREATININE: 0.94 mg/dL (ref 0.61–1.24)
Chloride: 106 mmol/L (ref 101–111)
Glucose, Bld: 98 mg/dL (ref 65–99)
Potassium: 4.3 mmol/L (ref 3.5–5.1)
Sodium: 143 mmol/L (ref 135–145)
TOTAL PROTEIN: 6.5 g/dL (ref 6.5–8.1)

## 2015-10-28 LAB — URINALYSIS, ROUTINE W REFLEX MICROSCOPIC
Bilirubin Urine: NEGATIVE
GLUCOSE, UA: NEGATIVE mg/dL
HGB URINE DIPSTICK: NEGATIVE
KETONES UR: NEGATIVE mg/dL
LEUKOCYTES UA: NEGATIVE
Nitrite: NEGATIVE
PROTEIN: NEGATIVE mg/dL
Specific Gravity, Urine: 1.025 (ref 1.005–1.030)
UROBILINOGEN UA: 0.2 mg/dL (ref 0.0–1.0)
pH: 5.5 (ref 5.0–8.0)

## 2015-10-28 LAB — SURGICAL PCR SCREEN
MRSA, PCR: NEGATIVE
Staphylococcus aureus: NEGATIVE

## 2015-10-28 LAB — APTT: aPTT: 25 seconds (ref 24–37)

## 2015-10-28 LAB — PROTIME-INR
INR: 1.01 (ref 0.00–1.49)
PROTHROMBIN TIME: 13.5 s (ref 11.6–15.2)

## 2015-10-29 LAB — URINE CULTURE: CULTURE: NO GROWTH

## 2015-11-04 NOTE — H&P (Signed)
CHIEF COMPLAINT:  Painful left knee.   HISTORY OF PRESENT ILLNESS:  Alan Knight is a very pleasant, 69 year old, white, married male who is retired and is seen today for evaluation of his left knee.  In the past, he has had a right total knee replacement as well as a right total hip replacement and has tolerated that very well.  He has had noting of osteoarthritis of his left knee for sometime now.  He has had corticosteroid injections for the left knee in the past, which have been somewhat beneficial.  He does try to continue to work out basically with the stair stepper and the exercise bike.  He is retired, and certainly he does play a little bit of golf at times.  His left knee, however, has started to become symptomatic to the point where he is having difficulty with his activities of daily living.  He has had multiple cortisone injections over the past several years.  He did, however, have some type of Synvisc reaction on March 30, 1997.  He had 2 injections without difficulty but with the third had marked swelling. They had to aspirate fluid, and he has not considered having viscosupplementation anymore.  He continues now to have pain and discomfort.  He did have a cortisone injection back in September 2016 which had only been temporarily beneficial.  It is now causing problems with his activities of daily living.  He has night time pain as well as difficulty with sleeping and ambulating.  His pain is worsened with activity.  At rest, it certainly has been improved.  He is using naproxen 100 mg in the morning and using Advil throughout the day.  He comes in today for a re-evaluation.   PAST MEDICAL HISTORY:  In general, his health is good.   PAST SURGICAL HISTORY:  1.  Between the right and left knees, he has had 16 surgical procedures.  The last was a right total knee arthroplasty.  2.  Four shoulder surgeries. 3.  Right total hip replacement. 4.  Herniorrhaphy.   CURRENT MEDICATIONS:  1.  Simvastatin  40 mg daily. 2.  Naproxen 500 mg 2 a day. 3.  Lisinopril 5 mg daily. 4.  Bayer aspirin, low dose, 81 mg daily. 5.  Vitamin with calcium citrate, vitamin D, magnesium, and zinc 1 daily. 6.  Centrum Silver 1 daily. 7.  CoQ10 300 mg daily. 8.  Omega-3 1000 mg daily. 9.  Triple flex, which is glucosamine, chondroitin, and MSN daily. 10.  Super vitamin B-complex 50 mcg daily. 11.  Vitamin C 1000 mg daily. 12.  Vitamin D3 5000 international units daily.   ALLERGIES:  NO KNOWN DRUG ALLERGIES.   SOCIAL HISTORY:  He is a 69 year old, white, married male who is retired.  He does not smoke and has not smoked.  He does drink gin and tonic nightly.   FAMILY HISTORY:  Positive for the mother who died at age 66 from "old age".  The father died at age 37 from a motor vehicle accident.  He was an alcoholic and had a seizure.  He has 1 brother that is living at 31.  He had 1 brother who was deceased at 86 from a myocardial infarction.  He apparently was a smoker.  He has 2 sisters age 57 and 50.   REVIEW OF SYSTEMS:  Fourteen point review of systems is unremarkable except for a bout, 1-1/2 years ago, of vertigo, none since.   PHYSICAL EXAMINATION:  Reveals a 69 year old,  white male who is well developed, well nourished, alert, pleasant, cooperative, and in moderate distress secondary to left knee pain.  He is 5 feet 8-1/2 inches.  His weight is 169 pounds.  BMI is 29.3. Vital signs:  Temperature 96.1.  Pulse 68.  Respirations 12.  Blood pressure 120/80. Head:  Normocephalic. Eyes:  Pupils are equal, round, and reactive to light and accommodation with extraocular movements intact. Ears, nose, and throat:  Benign. Neck:  Supple with no bruits. Chest:  Good expansion. Lungs:  Essentially clear. Cardiac:  He had a regular rhythm and rate.  Normal S1 and S2.  No discreet murmurs, rubs, or gallops appreciated. Lower extremities:  Pulses were 1+ bilateral and symmetric. Genital:  Not indicated for the  procedure. Rectal:  Not indicated for the procedure. Breasts:  Not indicated for the procedure. Abdomen:  Scaphoid is soft, nontender.  No mass palpable.  Normal bowel sounds present. CNS:  Oriented x3.  Cranial nerves II-XII are grossly intact. Musculoskeletal:  He has range of motion from about 5 degrees to about 95 degrees.  He has mild effusion and crepitance with range of motion.  He has pseudolaxity with varus stressing, but he has a good end point.  Calf is supple and nontender.  He is neurovascularly intact distally.   RADIOGRAPHS:  Reveals left lateral compartment near bone-on-bone osteoarthritis.  He does have periarticular spurring both medially and laterally.  He does have significant patellofemoral osteoarthritis also.   CLINICAL IMPRESSION:  End stage osteoarthritis of the left knee.   RECOMMENDATIONS:   1.  At this time, I have reviewed documentation from Dr. Forde Dandy at [facility], and he feels that he is clinically stable and is felt to be capable of undergoing this surgery from both a medical and a cardiac standpoint. 2.  Therefore, our plan is to proceed with a left total knee arthroplasty in the very near future.  The procedure, risks, and benefits were re-explained to him in detail, and a model was used to show the process.  All questions were answered in detail.   Alan Knight [place], [place] [phone]  11/04/2015 1:59 PM

## 2015-11-08 MED ORDER — CEFAZOLIN SODIUM-DEXTROSE 2-3 GM-% IV SOLR
2.0000 g | INTRAVENOUS | Status: AC
  Administered 2015-11-09: 2 g via INTRAVENOUS
  Filled 2015-11-08: qty 50

## 2015-11-09 ENCOUNTER — Encounter (HOSPITAL_COMMUNITY)
Admission: RE | Disposition: A | Discharge status: Home Health | Payer: Self-pay | Source: Ambulatory Visit | Attending: Orthopaedic Surgery

## 2015-11-09 ENCOUNTER — Inpatient Hospital Stay (HOSPITAL_COMMUNITY): Payer: PPO | Admitting: Certified Registered Nurse Anesthetist

## 2015-11-09 ENCOUNTER — Inpatient Hospital Stay (HOSPITAL_COMMUNITY)
Admission: RE | Admit: 2015-11-09 | Discharge: 2015-11-11 | DRG: 470 | Disposition: A | Discharge status: Home Health | Payer: PPO | Source: Ambulatory Visit | Attending: Orthopaedic Surgery | Admitting: Orthopaedic Surgery

## 2015-11-09 ENCOUNTER — Encounter (HOSPITAL_COMMUNITY): Payer: Self-pay | Admitting: Certified Registered Nurse Anesthetist

## 2015-11-09 DIAGNOSIS — Z96641 Presence of right artificial hip joint: Secondary | ICD-10-CM | POA: Diagnosis present

## 2015-11-09 DIAGNOSIS — Z96659 Presence of unspecified artificial knee joint: Secondary | ICD-10-CM

## 2015-11-09 DIAGNOSIS — M1712 Unilateral primary osteoarthritis, left knee: Secondary | ICD-10-CM

## 2015-11-09 DIAGNOSIS — M25562 Pain in left knee: Secondary | ICD-10-CM | POA: Diagnosis present

## 2015-11-09 DIAGNOSIS — I10 Essential (primary) hypertension: Secondary | ICD-10-CM | POA: Diagnosis present

## 2015-11-09 DIAGNOSIS — Z85828 Personal history of other malignant neoplasm of skin: Secondary | ICD-10-CM

## 2015-11-09 DIAGNOSIS — Z96651 Presence of right artificial knee joint: Secondary | ICD-10-CM | POA: Diagnosis present

## 2015-11-09 DIAGNOSIS — N401 Enlarged prostate with lower urinary tract symptoms: Secondary | ICD-10-CM | POA: Diagnosis present

## 2015-11-09 DIAGNOSIS — E785 Hyperlipidemia, unspecified: Secondary | ICD-10-CM | POA: Diagnosis present

## 2015-11-09 HISTORY — PX: TOTAL KNEE ARTHROPLASTY: SHX125

## 2015-11-09 HISTORY — DX: Presence of unspecified artificial knee joint: Z96.659

## 2015-11-09 HISTORY — DX: Unilateral primary osteoarthritis, left knee: M17.12

## 2015-11-09 LAB — TYPE AND SCREEN
ABO/RH(D): AB POS
Antibody Screen: NEGATIVE

## 2015-11-09 SURGERY — ARTHROPLASTY, KNEE, TOTAL
Anesthesia: Monitor Anesthesia Care | Site: Knee | Laterality: Left

## 2015-11-09 MED ORDER — MIDAZOLAM HCL 5 MG/5ML IJ SOLN
INTRAMUSCULAR | Status: DC | PRN
  Administered 2015-11-09 (×2): 1 mg via INTRAVENOUS

## 2015-11-09 MED ORDER — SODIUM CHLORIDE 0.9 % IV SOLN: INTRAVENOUS | Status: DC

## 2015-11-09 MED ORDER — ACETAMINOPHEN 10 MG/ML IV SOLN
1000.0000 mg | Freq: Four times a day (QID) | INTRAVENOUS | Status: AC
  Administered 2015-11-09 – 2015-11-10 (×4): 1000 mg via INTRAVENOUS
  Filled 2015-11-09 (×4): qty 100

## 2015-11-09 MED ORDER — METHOCARBAMOL 1000 MG/10ML IJ SOLN
500.0000 mg | Freq: Four times a day (QID) | INTRAVENOUS | Status: DC | PRN
  Filled 2015-11-09 (×2): qty 5

## 2015-11-09 MED ORDER — HYDROMORPHONE HCL 1 MG/ML IJ SOLN
0.5000 mg | INTRAMUSCULAR | Status: DC | PRN
  Administered 2015-11-09 – 2015-11-10 (×3): 1 mg via INTRAVENOUS
  Filled 2015-11-09 (×3): qty 1

## 2015-11-09 MED ORDER — RIVAROXABAN 10 MG PO TABS
10.0000 mg | ORAL_TABLET | Freq: Every day | ORAL | Status: DC
  Administered 2015-11-10 – 2015-11-11 (×2): 10 mg via ORAL
  Filled 2015-11-09 (×2): qty 1

## 2015-11-09 MED ORDER — ROPIVACAINE HCL 5 MG/ML IJ SOLN
INTRAMUSCULAR | Status: DC | PRN
  Administered 2015-11-09: 25 mL via PERINEURAL

## 2015-11-09 MED ORDER — FLUTICASONE PROPIONATE 50 MCG/ACT NA SUSP
2.0000 | Freq: Every day | NASAL | Status: DC | PRN
  Filled 2015-11-09: qty 16

## 2015-11-09 MED ORDER — PHENYLEPHRINE 40 MCG/ML (10ML) SYRINGE FOR IV PUSH (FOR BLOOD PRESSURE SUPPORT)
PREFILLED_SYRINGE | INTRAVENOUS | Status: AC
  Filled 2015-11-09: qty 10

## 2015-11-09 MED ORDER — BISACODYL 10 MG RE SUPP: 10.0000 mg | Freq: Every day | RECTAL | Status: DC | PRN

## 2015-11-09 MED ORDER — HYDROMORPHONE HCL 1 MG/ML IJ SOLN: 0.2500 mg | INTRAMUSCULAR | Status: DC | PRN

## 2015-11-09 MED ORDER — BUPIVACAINE IN DEXTROSE 0.75-8.25 % IT SOLN
INTRATHECAL | Status: DC | PRN
  Administered 2015-11-09: 2 mL via INTRATHECAL

## 2015-11-09 MED ORDER — BUPIVACAINE-EPINEPHRINE 0.25% -1:200000 IJ SOLN
INTRAMUSCULAR | Status: DC | PRN
  Administered 2015-11-09: 30 mL

## 2015-11-09 MED ORDER — PHENOL 1.4 % MT LIQD: 1.0000 | OROMUCOSAL | Status: DC | PRN

## 2015-11-09 MED ORDER — METHOCARBAMOL 500 MG PO TABS
500.0000 mg | ORAL_TABLET | Freq: Four times a day (QID) | ORAL | Status: DC | PRN
  Administered 2015-11-09 – 2015-11-11 (×4): 500 mg via ORAL
  Filled 2015-11-09 (×4): qty 1

## 2015-11-09 MED ORDER — DIPHENHYDRAMINE HCL 12.5 MG/5ML PO ELIX: 12.5000 mg | ORAL_SOLUTION | ORAL | Status: DC | PRN

## 2015-11-09 MED ORDER — SIMVASTATIN 40 MG PO TABS
40.0000 mg | ORAL_TABLET | Freq: Every day | ORAL | Status: DC
  Administered 2015-11-09 – 2015-11-10 (×2): 40 mg via ORAL
  Filled 2015-11-09 (×2): qty 1

## 2015-11-09 MED ORDER — EPHEDRINE SULFATE 50 MG/ML IJ SOLN
INTRAMUSCULAR | Status: DC | PRN
  Administered 2015-11-09 (×4): 10 mg via INTRAVENOUS

## 2015-11-09 MED ORDER — SODIUM CHLORIDE 0.9 % IV SOLN
2000.0000 mg | INTRAVENOUS | Status: DC
  Filled 2015-11-09: qty 20

## 2015-11-09 MED ORDER — LACTATED RINGERS IV SOLN
INTRAVENOUS | Status: DC
  Administered 2015-11-09 (×2): via INTRAVENOUS

## 2015-11-09 MED ORDER — SODIUM CHLORIDE 0.9 % IV SOLN
INTRAVENOUS | Status: DC
  Administered 2015-11-10: 05:00:00 via INTRAVENOUS

## 2015-11-09 MED ORDER — MIDAZOLAM HCL 2 MG/2ML IJ SOLN
INTRAMUSCULAR | Status: AC
  Filled 2015-11-09: qty 2

## 2015-11-09 MED ORDER — KETOROLAC TROMETHAMINE 15 MG/ML IJ SOLN
7.5000 mg | Freq: Four times a day (QID) | INTRAMUSCULAR | Status: AC
  Administered 2015-11-09 – 2015-11-10 (×4): 7.5 mg via INTRAVENOUS
  Filled 2015-11-09 (×4): qty 1

## 2015-11-09 MED ORDER — OXYCODONE HCL 5 MG PO TABS
ORAL_TABLET | ORAL | Status: AC
  Filled 2015-11-09: qty 1

## 2015-11-09 MED ORDER — 0.9 % SODIUM CHLORIDE (POUR BTL) OPTIME
TOPICAL | Status: DC | PRN
  Administered 2015-11-09: 1000 mL

## 2015-11-09 MED ORDER — FENTANYL CITRATE (PF) 100 MCG/2ML IJ SOLN
INTRAMUSCULAR | Status: DC | PRN
  Administered 2015-11-09: 50 ug via INTRAVENOUS

## 2015-11-09 MED ORDER — CEFAZOLIN SODIUM-DEXTROSE 2-3 GM-% IV SOLR
2.0000 g | Freq: Four times a day (QID) | INTRAVENOUS | Status: AC
  Administered 2015-11-09 – 2015-11-10 (×2): 2 g via INTRAVENOUS
  Filled 2015-11-09 (×4): qty 50

## 2015-11-09 MED ORDER — MAGNESIUM CITRATE PO SOLN: 1.0000 | Freq: Once | ORAL | Status: DC | PRN

## 2015-11-09 MED ORDER — ACETAMINOPHEN 10 MG/ML IV SOLN
1000.0000 mg | INTRAVENOUS | Status: DC
  Filled 2015-11-09: qty 100

## 2015-11-09 MED ORDER — CHLORHEXIDINE GLUCONATE 4 % EX LIQD: 60.0000 mL | Freq: Once | CUTANEOUS | Status: DC

## 2015-11-09 MED ORDER — ONDANSETRON HCL 4 MG/2ML IJ SOLN: 4.0000 mg | Freq: Four times a day (QID) | INTRAMUSCULAR | Status: DC | PRN

## 2015-11-09 MED ORDER — HYDROCODONE-ACETAMINOPHEN 7.5-325 MG PO TABS: 1.0000 | ORAL_TABLET | Freq: Once | ORAL | Status: DC | PRN

## 2015-11-09 MED ORDER — DOCUSATE SODIUM 100 MG PO CAPS
100.0000 mg | ORAL_CAPSULE | Freq: Two times a day (BID) | ORAL | Status: DC
  Administered 2015-11-09 – 2015-11-11 (×4): 100 mg via ORAL
  Filled 2015-11-09 (×4): qty 1

## 2015-11-09 MED ORDER — ALUM & MAG HYDROXIDE-SIMETH 200-200-20 MG/5ML PO SUSP: 30.0000 mL | ORAL | Status: DC | PRN

## 2015-11-09 MED ORDER — METOCLOPRAMIDE HCL 5 MG/ML IJ SOLN: 5.0000 mg | Freq: Three times a day (TID) | INTRAMUSCULAR | Status: DC | PRN

## 2015-11-09 MED ORDER — BUPIVACAINE-EPINEPHRINE (PF) 0.25% -1:200000 IJ SOLN
INTRAMUSCULAR | Status: AC
  Filled 2015-11-09: qty 30

## 2015-11-09 MED ORDER — OXYCODONE HCL 5 MG PO TABS
5.0000 mg | ORAL_TABLET | ORAL | Status: DC | PRN
  Administered 2015-11-09: 5 mg via ORAL
  Administered 2015-11-09 – 2015-11-10 (×3): 10 mg via ORAL
  Filled 2015-11-09 (×3): qty 2

## 2015-11-09 MED ORDER — LISINOPRIL 5 MG PO TABS
5.0000 mg | ORAL_TABLET | Freq: Every day | ORAL | Status: DC
  Administered 2015-11-09 – 2015-11-11 (×3): 5 mg via ORAL
  Filled 2015-11-09 (×3): qty 1

## 2015-11-09 MED ORDER — POLYETHYLENE GLYCOL 3350 17 G PO PACK: 17.0000 g | PACK | Freq: Every day | ORAL | Status: DC | PRN

## 2015-11-09 MED ORDER — PROPOFOL 500 MG/50ML IV EMUL
INTRAVENOUS | Status: DC | PRN
  Administered 2015-11-09: 75 ug/kg/min via INTRAVENOUS
  Administered 2015-11-09: 60 ug/kg/min via INTRAVENOUS

## 2015-11-09 MED ORDER — FENTANYL CITRATE (PF) 250 MCG/5ML IJ SOLN
INTRAMUSCULAR | Status: AC
  Filled 2015-11-09: qty 5

## 2015-11-09 MED ORDER — METOCLOPRAMIDE HCL 5 MG PO TABS: 5.0000 mg | ORAL_TABLET | Freq: Three times a day (TID) | ORAL | Status: DC | PRN

## 2015-11-09 MED ORDER — FENTANYL CITRATE (PF) 100 MCG/2ML IJ SOLN
INTRAMUSCULAR | Status: AC
  Administered 2015-11-09: 50 ug via INTRAVENOUS
  Filled 2015-11-09: qty 2

## 2015-11-09 MED ORDER — PROMETHAZINE HCL 25 MG/ML IJ SOLN: 6.2500 mg | INTRAMUSCULAR | Status: DC | PRN

## 2015-11-09 MED ORDER — ACETAMINOPHEN 10 MG/ML IV SOLN
1000.0000 mg | Freq: Once | INTRAVENOUS | Status: AC
  Administered 2015-11-09: 1000 mg via INTRAVENOUS

## 2015-11-09 MED ORDER — TRANEXAMIC ACID 1000 MG/10ML IV SOLN
2000.0000 mg | INTRAVENOUS | Status: DC | PRN
  Administered 2015-11-09: 2000 mg via TOPICAL

## 2015-11-09 MED ORDER — MENTHOL 3 MG MT LOZG: 1.0000 | LOZENGE | OROMUCOSAL | Status: DC | PRN

## 2015-11-09 MED ORDER — ONDANSETRON HCL 4 MG PO TABS: 4.0000 mg | ORAL_TABLET | Freq: Four times a day (QID) | ORAL | Status: DC | PRN

## 2015-11-09 SURGICAL SUPPLY — 63 items
BANDAGE ESMARK 6X9 LF (GAUZE/BANDAGES/DRESSINGS) ×1 IMPLANT
BLADE SAGITTAL 25.0X1.19X90 (BLADE) ×2 IMPLANT
BNDG CMPR 9X6 STRL LF SNTH (GAUZE/BANDAGES/DRESSINGS) ×1
BNDG ESMARK 6X9 LF (GAUZE/BANDAGES/DRESSINGS) ×2
BOWL SMART MIX CTS (DISPOSABLE) ×2 IMPLANT
CAP KNEE TOTAL 3 SIGMA ×1 IMPLANT
CEMENT HV SMART SET (Cement) ×3 IMPLANT
COVER SURGICAL LIGHT HANDLE (MISCELLANEOUS) ×2 IMPLANT
CUFF TOURNIQUET SINGLE 34IN LL (TOURNIQUET CUFF) IMPLANT
CUFF TOURNIQUET SINGLE 44IN (TOURNIQUET CUFF) IMPLANT
DRAPE EXTREMITY T 121X128X90 (DRAPE) ×2 IMPLANT
DRAPE PROXIMA HALF (DRAPES) ×2 IMPLANT
DRSG ADAPTIC 3X8 NADH LF (GAUZE/BANDAGES/DRESSINGS) ×2 IMPLANT
DRSG PAD ABDOMINAL 8X10 ST (GAUZE/BANDAGES/DRESSINGS) ×4 IMPLANT
DURAPREP 26ML APPLICATOR (WOUND CARE) ×4 IMPLANT
ELECT CAUTERY BLADE 6.4 (BLADE) ×2 IMPLANT
ELECT REM PT RETURN 9FT ADLT (ELECTROSURGICAL) ×2
ELECTRODE REM PT RTRN 9FT ADLT (ELECTROSURGICAL) ×1 IMPLANT
EVACUATOR 1/8 PVC DRAIN (DRAIN) ×1 IMPLANT
FACESHIELD WRAPAROUND (MASK) ×4 IMPLANT
FACESHIELD WRAPAROUND OR TEAM (MASK) ×2 IMPLANT
GAUZE SPONGE 4X4 12PLY STRL (GAUZE/BANDAGES/DRESSINGS) ×2 IMPLANT
GLOVE BIO SURGEON STRL SZ 6.5 (GLOVE) ×1 IMPLANT
GLOVE BIOGEL PI IND STRL 7.0 (GLOVE) IMPLANT
GLOVE BIOGEL PI IND STRL 8 (GLOVE) ×1 IMPLANT
GLOVE BIOGEL PI IND STRL 8.5 (GLOVE) ×1 IMPLANT
GLOVE BIOGEL PI INDICATOR 7.0 (GLOVE) ×1
GLOVE BIOGEL PI INDICATOR 8 (GLOVE) ×1
GLOVE BIOGEL PI INDICATOR 8.5 (GLOVE) ×1
GLOVE ECLIPSE 8.0 STRL XLNG CF (GLOVE) ×5 IMPLANT
GLOVE SURG ORTHO 8.5 STRL (GLOVE) ×4 IMPLANT
GOWN STRL REUS W/ TWL LRG LVL3 (GOWN DISPOSABLE) ×2 IMPLANT
GOWN STRL REUS W/TWL 2XL LVL3 (GOWN DISPOSABLE) ×2 IMPLANT
GOWN STRL REUS W/TWL LRG LVL3 (GOWN DISPOSABLE) ×4
HANDPIECE INTERPULSE COAX TIP (DISPOSABLE) ×2
KIT BASIN OR (CUSTOM PROCEDURE TRAY) ×2 IMPLANT
KIT ROOM TURNOVER OR (KITS) ×2 IMPLANT
MANIFOLD NEPTUNE II (INSTRUMENTS) ×2 IMPLANT
NEEDLE 22X1 1/2 (OR ONLY) (NEEDLE) ×2 IMPLANT
NS IRRIG 1000ML POUR BTL (IV SOLUTION) ×2 IMPLANT
PACK TOTAL JOINT (CUSTOM PROCEDURE TRAY) ×2 IMPLANT
PACK UNIVERSAL I (CUSTOM PROCEDURE TRAY) ×2 IMPLANT
PAD ABD 8X10 STRL (GAUZE/BANDAGES/DRESSINGS) ×2 IMPLANT
PAD ARMBOARD 7.5X6 YLW CONV (MISCELLANEOUS) ×4 IMPLANT
PAD CAST 4YDX4 CTTN HI CHSV (CAST SUPPLIES) ×1 IMPLANT
PADDING CAST COTTON 4X4 STRL (CAST SUPPLIES) ×2
PADDING CAST COTTON 6X4 STRL (CAST SUPPLIES) ×2 IMPLANT
SET HNDPC FAN SPRY TIP SCT (DISPOSABLE) ×1 IMPLANT
SLEEVE SURGEON STRL (DRAPES) ×1 IMPLANT
STAPLER VISISTAT 35W (STAPLE) ×2 IMPLANT
SUCTION FRAZIER TIP 10 FR DISP (SUCTIONS) ×2 IMPLANT
SURGIFLO W/THROMBIN 8M KIT (HEMOSTASIS) IMPLANT
SUT BONE WAX W31G (SUTURE) ×2 IMPLANT
SUT ETHIBOND NAB CT1 #1 30IN (SUTURE) ×4 IMPLANT
SUT MNCRL AB 3-0 PS2 18 (SUTURE) ×2 IMPLANT
SUT VIC AB 0 CT1 27 (SUTURE) ×2
SUT VIC AB 0 CT1 27XBRD ANBCTR (SUTURE) ×1 IMPLANT
SYR CONTROL 10ML LL (SYRINGE) IMPLANT
TOWEL OR 17X24 6PK STRL BLUE (TOWEL DISPOSABLE) ×2 IMPLANT
TOWEL OR 17X26 10 PK STRL BLUE (TOWEL DISPOSABLE) ×2 IMPLANT
TRAY FOLEY CATH 16FRSI W/METER (SET/KITS/TRAYS/PACK) IMPLANT
WATER STERILE IRR 1000ML POUR (IV SOLUTION) ×2 IMPLANT
WRAP KNEE MAXI GEL POST OP (GAUZE/BANDAGES/DRESSINGS) ×2 IMPLANT

## 2015-11-09 NOTE — Op Note (Signed)
Knight, Alan NO.:  [id number]  MEDICAL RECORD NO.:  [id number]  LOCATION:  5N24C                        FACILITY:  [facility]  PHYSICIAN:  Vonna Kotyk. Nekita Pita, M.D.DATE OF BIRTH:  Mar 30, 1946  DATE OF PROCEDURE:  11/09/2015 DATE OF DISCHARGE:                              OPERATIVE REPORT   PREOPERATIVE DIAGNOSIS:  Primary, end-stage osteoarthritis, left knee.  POSTOPERATIVE DIAGNOSIS:  Primary, end-stage osteoarthritis, left knee.  PROCEDURE:  Left total knee replacement.  SURGEON:  Vonna Kotyk. Durward Fortes, M.D.  ASSISTANT:  Aaron Edelman D. Petrarca, P.A.-C., who was present throughout the operative procedure to ensure its timely completion.  ANESTHESIA:  Spinal with IV sedation.  COMPLICATIONS:  None.  COMPONENTS:  DePuy LCS standard plus femoral component, a #5 rotating keeled tibial tray, 12.5 mm polyethylene bridging bearing, a metal back rotating 3-peg patella.  All secured with polymethyl methacrylate.  DESCRIPTION OF PROCEDURE:  [name] was met with his wife in the holding area.  I Identified the left knee as appropriate operative site and marked it accordingly.  He was then transported to room #7.  Spinal anesthesia was performed by Anesthesia.  He was then placed comfortably supine on the operating table and under IV sedation, the left lower extremity was then placed in a thigh tourniquet.  The leg elevated.  It was prepped with chlorhexidine scrub and DuraPrep x2 from the tourniquet to the tips of the toes.  Sterile draping was performed.  Time-out was called.  The extremity was then elevated and then [facility] with the tourniquet at 350 mmHg.  A midline longitudinal incision was made, centered about the patella, extending from superior pouch to the tibial tubercle.  Via sharp dissection, incision was carried down to subcutaneous tissue.  First layer of capsule was incised in the midline and medial parapatellar incision was made with the Bovie.   The joint was entered.  There was a small clear yellow joint effusion.  The patella was everted to 180 degrees laterally, the knee flexed to 90 degrees.  There was complete loss of articular cartilage on the lateral femoral condyle with large osteophytes both medially and laterally.  There was abundant synovitis consistent with old knee arthroscopy, it was relatively smooth. Synovectomy was performed.  The osteophytes were removed from the femur. I measured a standard plus femoral component.  With the knee flexed 90 degrees, first bony cut was made transversely in the proximal tibia with the external tibial guide using an 8-degree angle of declination.  With each bony cut on the tibia and the femur, I used the external guide to assure normal alignment.  He did have a fixed valgus position preoperatively with a small fixed flexure contracture. I did do a lateral release along the tibia, which then allowed me to align the leg in normal position.  Subsequent cuts were then made on the femur using the standard plus femoral guide.  I used a 3-degree distal femoral valgus cut.  Lamina spreaders were then inserted in the medial and lateral compartments.  I removed medial and lateral menisci as well as ACL and PCL.  Osteophytes were removed from the posterior femoral condyle with a 3/4-inch curved  osteotome.  ACL and PCL were removed as well.  Finishing jig was then applied to the femur for the tapering purposes obtained in the center hole.  Retractor was then carefully placed about the tibia, was advanced anteriorly, measured a #5 tibial tray.  This was pinned in place.  I checked the alignment.  Center hole was made followed by the keeled cut. With the metallic tibial jig in place, a 12.5 mm bridging bearing was inserted as our flexion and extension gaps were perfectly symmetrical at 12.5 mm.  The trial femoral component was then applied and then reduced through a full range of motion.   There was no opening with varus or valgus stress, full extension, and excellent alignment.  Patella was prepared by removing 10 mm of bone, leaving 14 mm of patella thickness.  Trial patella was then applied after drilling the 3 holes with the patella jig.  With the entire trial components in place, the knee was reduced and again through a full range of motion, we felt we had excellent stability.  No opening with varus or valgus stress and excellent alignment.  Trial components were removed.  The joint was copiously irrigated with saline solution.  The final components were then impacted with polymethyl methacrylate.  Initially, I applied the #5 tibial tray followed by the 12.5 mm polyethylene bridging bearing, a standard plus femoral component, and then the patella.  These were maintained to place the knee in extension under compression.  Extraneous methacrylate was removed from the periphery of the components.  Approximately 16 minutes of methacrylate had matured, during which time, we infiltrated the deep capsule with 0.25% Marcaine with epinephrine.  Tourniquet was deflated to 84 minutes.  Because of the fixed valgus position, it took a little bit longer to realign the knee.  There were some small bleeders, used Bovie coagulator.  We did apply topical tranexamic acid in place for approximately 5 minutes until we had a very nice dry field.  Medium-size Hemovac was placed through the lateral compartment.  Deep capsule was then closed with a running 0 Ethibond, superficial capsule with a running 0 Vicryl, subcu with 3-0 Monocryl, skin closed with skin clips.  Sterile bulky dressing was applied followed by the patient's support stocking.  The patient was then placed on the operating room stretcher, returned to the postanesthesia recovery room without problems.     Vonna Kotyk. Durward Fortes, M.D.     PWW/MEDQ  D:  11/09/2015  T:  11/09/2015  Job:  [id number]

## 2015-11-09 NOTE — Anesthesia Procedure Notes (Addendum)
Anesthesia Regional Block:  Femoral nerve block  Pre-Anesthetic Checklist: ,, timeout performed, Correct Patient, Correct Site, Correct Laterality, Correct Procedure, Correct Position, site marked, Risks and benefits discussed,  Surgical consent,  Pre-op evaluation,  At surgeon's request and post-op pain management  Laterality: Left  Prep: chloraprep       Needles:  Injection technique: Single-shot  Needle Type: Echogenic Stimulator Needle     Needle Length: 9cm 9 cm Needle Gauge: 21 and 21 G    Additional Needles:  Procedures: ultrasound guided (picture in chart) and nerve stimulator Femoral nerve block  Nerve Stimulator or Paresthesia:  Response: quadraceps contraction, 0.45 mA,   Additional Responses:   Narrative:  Start time: 11/09/2015 9:30 AM End time: 11/09/2015 9:40 AM Injection made incrementally with aspirations every 5 mL.  Performed by: Personally  Anesthesiologist: Suzette Battiest  Additional Notes: Risks and benefits discussed. Pt tolerated well with no immediate complications   Procedure Name: MAC Date/Time: 11/09/2015 10:20 AM Performed by: Salli Quarry WEAVER Pre-anesthesia Checklist: Patient identified, Emergency Drugs available, Suction available and Patient being monitored Patient Re-evaluated:Patient Re-evaluated prior to inductionOxygen Delivery Method: Nasal cannula    Spinal Patient location during procedure: OR Staffing Anesthesiologist: Suzette Battiest Performed by: anesthesiologist  Preanesthetic Checklist Completed: patient identified, site marked, surgical consent, pre-op evaluation, timeout performed, IV checked, risks and benefits discussed and monitors and equipment checked Spinal Block Patient position: sitting Prep: site prepped and draped and DuraPrep Patient monitoring: heart rate, continuous pulse ox and blood pressure Approach: right paramedian Location: L4-5 Injection technique: single-shot Needle Needle  type: Quincke  Needle gauge: 22 G Needle length: 9 cm Additional Notes Expiration date of kit checked and confirmed. Patient tolerated procedure well, without complications.

## 2015-11-09 NOTE — Op Note (Signed)
PATIENT ID:      Alan Knight  MRN:     [id number] DOB/AGE:    69-Mar-1947 / 69 y.o.       OPERATIVE REPORT    DATE OF PROCEDURE:  11/09/2015       PREOPERATIVE DIAGNOSIS:PRIMARY, END STAGE   LEFT KNEE OSTEOARTHRITIS                                                       Estimated body mass index is 23.7 kg/(m^2) as calculated from the following:   Height as of this encounter: 5\' 10"  (1.778 m).   Weight as of this encounter: 74.929 kg (165 lb 3 oz).     POSTOPERATIVE DIAGNOSIS:   LEFT KNEE OSTEOARTHRITIS -SAME                                                                    Estimated body mass index is 23.7 kg/(m^2) as calculated from the following:   Height as of this encounter: 5\' 10"  (1.778 m).   Weight as of this encounter: 74.929 kg (165 lb 3 oz).     PROCEDURE:  Procedure(s):LEFT TOTAL KNEE ARTHROPLASTY      SURGEON:  [name], MD    ASSISTANT:   [name], PA-C   (Present and scrubbed throughout the case, critical for assistance with exposure, retraction, instrumentation, and closure.)          ANESTHESIA: spinal and IV sedation     DRAINS: (LEFT KNEE) Hemovact drain(s) in the CLAMPED with  Suction Clamped :      TOURNIQUET TIME:  Total Tourniquet Time Documented: Thigh (Left) - 84 minutes Total: Thigh (Left) - 84 minutes     COMPLICATIONS:  None   CONDITION:  stable  PROCEDURE IN OR:[id number]   [name] 11/09/2015, 12:27 PM

## 2015-11-09 NOTE — Anesthesia Preprocedure Evaluation (Addendum)
Anesthesia Evaluation  Patient identified by MRN, date of birth, ID band Patient awake    Reviewed: Allergy & Precautions, NPO status , Patient's Chart, lab work & pertinent test results  Airway Mallampati: II  TM Distance: >3 FB Neck ROM: Full    Dental  (+) Dental Advisory Given   Pulmonary neg pulmonary ROS,    breath sounds clear to auscultation       Cardiovascular hypertension, Pt. on medications  Rhythm:Regular Rate:Normal     Neuro/Psych negative neurological ROS     GI/Hepatic negative GI ROS, Neg liver ROS,   Endo/Other  negative endocrine ROS  Renal/GU negative Renal ROS     Musculoskeletal  (+) Arthritis , Osteoarthritis,    Abdominal   Peds  Hematology negative hematology ROS (+)   Anesthesia Other Findings   Reproductive/Obstetrics                            Lab Results  Component Value Date   WBC 5.5 10/28/2015   HGB 15.0 10/28/2015   HCT 44.7 10/28/2015   MCV 95.5 10/28/2015   PLT 180 10/28/2015   Lab Results  Component Value Date   CREATININE 0.94 10/28/2015   BUN 36* 10/28/2015   NA 143 10/28/2015   K 4.3 10/28/2015   CL 106 10/28/2015   CO2 29 10/28/2015   Lab Results  Component Value Date   INR 1.01 10/28/2015    Anesthesia Physical Anesthesia Plan  ASA: II  Anesthesia Plan: Regional, Spinal and MAC   Post-op Pain Management:    Induction: Intravenous  Airway Management Planned: Simple Face Mask and Natural Airway  Additional Equipment:   Intra-op Plan:   Post-operative Plan:   Informed Consent: I have reviewed the patients History and Physical, chart, labs and discussed the procedure including the risks, benefits and alternatives for the proposed anesthesia with the patient or authorized representative who has indicated his/her understanding and acceptance.     Plan Discussed with: CRNA  Anesthesia Plan Comments:          Anesthesia Quick Evaluation

## 2015-11-09 NOTE — Transfer of Care (Signed)
Immediate Anesthesia Transfer of Care Note  Patient: Alan Knight  Procedure(s) Performed: Procedure(s): TOTAL KNEE ARTHROPLASTY (Left)  Patient Location: PACU  Anesthesia Type:MAC, Regional and Spinal  Level of Consciousness: awake, alert , oriented and patient cooperative  Airway & Oxygen Therapy: Patient Spontanous Breathing  Post-op Assessment: Report given to RN and Post -op Vital signs reviewed and stable  Post vital signs: Reviewed and stable  Last Vitals:  Filed Vitals:   11/09/15 0955  BP: 161/76  Pulse: 58  Temp:   Resp: 19    Complications: No apparent anesthesia complications

## 2015-11-09 NOTE — H&P (Signed)
  The recent History & Physical has been reviewed. I have personally examined the patient today. There is no interval change to the documented History & Physical. The patient would like to proceed with the procedure.  [name] 11/09/2015,  9:57 AM

## 2015-11-09 NOTE — Progress Notes (Signed)
Orthopedic Tech Progress Note Patient Details:  Alan Knight May 14, 1946 [id number]  CPM Left Knee CPM Left Knee: On Left Knee Flexion (Degrees): 90 Left Knee Extension (Degrees): 0 Additional Comments: Foot roll   Alan Knight 11/09/2015, 1:45 PM

## 2015-11-10 ENCOUNTER — Encounter (HOSPITAL_COMMUNITY): Payer: Self-pay | Admitting: Orthopaedic Surgery

## 2015-11-10 LAB — BASIC METABOLIC PANEL
ANION GAP: 6 (ref 5–15)
BUN: 17 mg/dL (ref 6–20)
CALCIUM: 8.6 mg/dL — AB (ref 8.9–10.3)
CHLORIDE: 104 mmol/L (ref 101–111)
CO2: 28 mmol/L (ref 22–32)
Creatinine, Ser: 0.78 mg/dL (ref 0.61–1.24)
GFR calc non Af Amer: >60 mL/min (ref >60)
GLUCOSE: 108 mg/dL — AB (ref 65–99)
Potassium: 4 mmol/L (ref 3.5–5.1)
Sodium: 138 mmol/L (ref 135–145)

## 2015-11-10 LAB — CBC
HEMATOCRIT: 37.2 % — AB (ref 39.0–52.0)
HEMOGLOBIN: 12.6 g/dL — AB (ref 13.0–17.0)
MCH: 32.1 pg (ref 26.0–34.0)
MCHC: 33.9 g/dL (ref 30.0–36.0)
MCV: 94.7 fL (ref 78.0–100.0)
Platelets: 158 10*3/uL (ref 150–400)
RBC: 3.93 MIL/uL — ABNORMAL LOW (ref 4.22–5.81)
RDW: 13.5 % (ref 11.5–15.5)
WBC: 6 10*3/uL (ref 4.0–10.5)

## 2015-11-10 MED ORDER — HYDROMORPHONE HCL 2 MG PO TABS
2.0000 mg | ORAL_TABLET | ORAL | Status: DC | PRN
  Administered 2015-11-10 – 2015-11-11 (×6): 4 mg via ORAL
  Filled 2015-11-10 (×6): qty 2

## 2015-11-10 NOTE — Evaluation (Signed)
Physical Therapy Evaluation Patient Details Name: Alan Knight MRN: [id number] DOB: March 04, 1946 Today's Date: 11/10/2015   History of Present Illness  69 y.o. male admitted to [facility] on 11/09/15 for elective L TKA.  Pt with significant PMHx of HTN, Bil shoulder surgery, R hip arhtroplasty, left carpal tunnel, multiple knee surgeries.   Clinical Impression  Pt is POD #1 and is moving very well.  He ambulated with min guard to supervision with RW into the hallway.  Education and exercises initiated.   PT to follow acutely for deficits listed below.       Follow Up Recommendations Home health PT;Supervision for mobility/OOB    Equipment Recommendations  None recommended by PT    Recommendations for Other Services   NA    Precautions / Restrictions Precautions Precautions: Knee Precaution Booklet Issued: Yes (comment) Precaution Comments: exercise handout given knee precaution reviewed, PWB status reviewed Restrictions Weight Bearing Restrictions: Yes LLE Weight Bearing: Partial weight bearing LLE Partial Weight Bearing Percentage or Pounds: 50%      Mobility  Bed Mobility Overal bed mobility: Needs Assistance Bed Mobility: Supine to Sit     Supine to sit: Min assist     General bed mobility comments: min assist to help progress left leg over EOB.   Transfers Overall transfer level: Needs assistance Equipment used: Rolling walker (2 wheeled) Transfers: Sit to/from Stand Sit to Stand: Min guard         General transfer comment: Min guard assist for safety, stood quickly before cues to push up from bed, will need to reinfoce safe hand placement.   Ambulation/Gait Ambulation/Gait assistance: Min guard Ambulation Distance (Feet): 150 Feet Assistive device: Rolling walker (2 wheeled) Gait Pattern/deviations: Step-through pattern;Antalgic;Trunk flexed     General Gait Details: verbal cues for upright posture, heel to toe gait pattern.  Min guard assist for safety at  first, progressed to supervision by end of gait.  Mild lightheadedness reported.          Balance Overall balance assessment: Needs assistance Sitting-balance support: Feet supported;No upper extremity supported Sitting balance-Leahy Scale: Good     Standing balance support: Bilateral upper extremity supported;No upper extremity supported Standing balance-Leahy Scale: Fair                               Pertinent Vitals/Pain Pain Assessment: 0-10 Pain Score: 6  Pain Location: left knee with motion Pain Descriptors / Indicators: Aching;Burning Pain Intervention(s): Limited activity within patient's tolerance;Monitored during session;Repositioned    Home Living Family/patient expects to be discharged to:: Private residence Living Arrangements: Spouse/significant other Available Help at Discharge: Family;Available 24 hours/day Type of Home: House Home Access: Stairs to enter Entrance Stairs-Rails: None Entrance Stairs-Number of Steps: 2 Home Layout: Two level;Able to live on main level with bedroom/bathroom Home Equipment: Gilford Rile - 2 wheels;Bedside commode;Crutches;Cane - single point      Prior Function Level of Independence: Independent         Comments: drives     Hand Dominance   Dominant Hand: Right    Extremity/Trunk Assessment   Upper Extremity Assessment: Defer to OT evaluation           Lower Extremity Assessment: LLE deficits/detail   LLE Deficits / Details: left leg with normal post op pain and weakness.  Ankle at least 3/5, knee 2-/5, hip 2+/5  Cervical / Trunk Assessment: Normal  Communication   Communication: No difficulties  Cognition Arousal/Alertness: Awake/alert  Behavior During Therapy: WFL for tasks assessed/performed Overall Cognitive Status: Within Functional Limits for tasks assessed                      General Comments      Exercises        Assessment/Plan    PT Assessment Patient needs continued  PT services  PT Diagnosis Difficulty walking;Abnormality of gait;Acute pain;Generalized weakness   PT Problem List Decreased strength;Decreased range of motion;Decreased activity tolerance;Decreased balance;Decreased mobility;Decreased knowledge of use of DME;Decreased knowledge of precautions;Pain  PT Treatment Interventions DME instruction;Gait training;Stair training;Functional mobility training;Therapeutic activities;Therapeutic exercise;Balance training;Neuromuscular re-education;Patient/family education;Manual techniques;Modalities   PT Goals (Current goals can be found in the Care Plan section) Acute Rehab PT Goals Patient Stated Goal: to do well and go home with his wife PT Goal Formulation: With patient Time For Goal Achievement: 11/17/15 Potential to Achieve Goals: Good    Frequency 7X/week           End of Session Equipment Utilized During Treatment: Gait belt Activity Tolerance: Other (comment) (mild nausea at end of session) Patient left: in chair;with call bell/phone within reach Nurse Communication: Mobility status         Time:  -  10:48-11:28     Charges:  1 EV 1 gait, 1 TE            [name], [place], DPT [phone]   11/10/2015, 11:15 AM

## 2015-11-10 NOTE — Progress Notes (Signed)
Patient ID: Alan Knight, male   DOB: Sep 04, 1946, 69 y.o.   MRN: [id number] PATIENT ID: Alan Knight        MRN:  [id number]          DOB/AGE: 10-25-1946 / 69 y.o.    Alan Fears, MD   Alan Borg, PA-C [address] [place], [place]  [address]                             [phone]   PROGRESS NOTE  Subjective:  negative for Chest Pain  negative for Shortness of Breath  negative for Nausea/Vomiting   negative for Calf Pain    Tolerating Diet: yes         Patient reports pain as moderate.     Hopefully better control with dilaudid today  Objective: Vital signs in last 24 hours:   Patient Vitals for the past 24 hrs:  BP Temp Temp src Pulse Resp SpO2 Height Weight  11/10/15 0520 136/73 mmHg 98.3 F (36.8 C) Oral (!) 58 18 99 % - -  11/10/15 0217 130/61 mmHg 97.7 F (36.5 C) Oral (!) 57 18 99 % - -  11/09/15 2111 (!) 158/80 mmHg 97.8 F (36.6 C) Oral 64 18 100 % - -  11/09/15 1624 (!) 154/89 mmHg 97.7 F (36.5 C) Oral 62 16 100 % - -  11/09/15 1600 - 97.2 F (36.2 C) - - - - - -  11/09/15 1555 (!) 154/80 mmHg - - (!) 57 13 98 % - -  11/09/15 1525 (!) 149/81 mmHg - - 63 14 99 % - -  11/09/15 1510 (!) 158/77 mmHg - - 61 15 99 % - -  11/09/15 1455 131/88 mmHg - - 67 18 99 % - -  11/09/15 1440 137/85 mmHg - - 61 12 99 % - -  11/09/15 1425 (!) 149/78 mmHg 97.4 F (36.3 C) - (!) 57 17 98 % - -  11/09/15 1410 (!) 143/78 mmHg - - 60 14 97 % - -  11/09/15 1355 137/77 mmHg - - 63 14 98 % - -  11/09/15 1340 (!) 149/72 mmHg - - 62 16 97 % - -  11/09/15 1325 129/63 mmHg - - 62 16 97 % - -  11/09/15 1310 139/68 mmHg - - 64 15 97 % - -  11/09/15 1256 136/61 mmHg - - 65 15 98 % - -  11/09/15 1255 - 97.2 F (36.2 C) - - - - - -  11/09/15 0955 (!) 161/76 mmHg - - (!) 58 19 100 % - -  11/09/15 0950 132/70 mmHg - - (!) 55 12 100 % - -  11/09/15 0945 132/70 mmHg - - (!) 52 15 100 % - -  11/09/15 0940 - - - (!) 51 (!) 7 100 % - -  11/09/15 0935 (!) 143/72 mmHg - - (!) 52 (!) 8  100 % - -  11/09/15 0934 - - - (!) 58 (!) 9 100 % - -  11/09/15 0856 132/70 mmHg 97.1 F (36.2 C) Oral (!) 51 20 98 % 5\' 10"  (1.778 m) 74.929 kg (165 lb 3 oz)      Intake/Output from previous day:   11/15 0701 - 11/16 0700 In: 3020 [P.O.:620; I.V.:2000] Out: 1635 [Urine:860; Drains:725]   Intake/Output this shift:       Intake/Output      11/15 0701 - 11/16 0700 11/16 0701 - 11/17  0700   P.O. 620    I.V. (mL/kg) 2000 (26.7)    IV Piggyback 400    Total Intake(mL/kg) 3020 (40.3)    Urine (mL/kg/hr) 860    Drains 725    Blood 50    Total Output 1635     Net +1385             LABORATORY DATA:  Recent Labs  11/10/15 0529  WBC 6.0  HGB 12.6*  HCT 37.2*  PLT 158    Recent Labs  11/10/15 0529  NA 138  K 4.0  CL 104  CO2 28  BUN 17  CREATININE 0.78  GLUCOSE 108*  CALCIUM 8.6*   Lab Results  Component Value Date   INR 1.01 10/28/2015    Recent Radiographic Studies :  Dg Chest 2 View  10/28/2015  CLINICAL DATA:  Pre admission testing.  Knee replacement. EXAM: CHEST  2 VIEW COMPARISON:  None. FINDINGS: The heart size and mediastinal contours are within normal limits. Both lungs are clear. The visualized skeletal structures are unremarkable. IMPRESSION: No active cardiopulmonary disease. Electronically Signed   By: Kerby Moors M.D.   On: 10/28/2015 08:59     Examination:  General appearance: alert, cooperative and no distress  Wound Exam: clean, dry, intact   Drainage:  Moderate amount Serosanguinous exudate in hemovac-175cc in last shift  Motor Exam: , FHL, Anterior Tibial and Posterior Tibial Intact  Sensory Exam: Superficial Peroneal, Deep Peroneal and Tibial normal  Vascular Exam: -good cap refill to toes, toes warm  Assessment:    1 Day Post-Op  Procedure(s) (LRB): TOTAL KNEE ARTHROPLASTY (Left)  ADDITIONAL DIAGNOSIS:  Principal Problem:   Primary osteoarthritis of left knee Active Problems:   S/P total knee replacement using cement  no  new problems   Plan: Physical Therapy as ordered Partial Weight Bearing @ 50% (PWB)  DVT Prophylaxis:  Xarelto, Foot Pumps and TED hose  DISCHARGE PLAN: Home  DISCHARGE NEEDS: HHPT OOB with PT,voiding without difficulty, saline lock IV, hope for D/C in am       [facility], PETER W  11/10/2015 8:05 AM

## 2015-11-10 NOTE — Progress Notes (Signed)
Physical Therapy Treatment Patient Details Name: Alan Knight MRN: [id number] DOB: 29-Nov-1946 Today's Date: 11/10/2015    History of Present Illness 69 y.o. male admitted to [facility] on 11/09/15 for elective L TKA.  Pt with significant PMHx of HTN, Bil shoulder surgery, R hip arhtroplasty, left carpal tunnel, multiple knee surgeries.     PT Comments    Pt c/o of increase in pain/stiffness in beginning of afternoon treatment, pt limited in walking distance due to pain; nurse informed. Pt is impulsive to get up; pt stated that he could "reach over to turn on the lights if he needed to", pt instructed to NOT get up alone but to call the nurse for help. Pt placed in CPM at end of treatment; ice pack applied to help decrease swelling and pain.   Follow Up Recommendations  Home health PT;Supervision for mobility/OOB     Equipment Recommendations  None recommended by PT       Precautions / Restrictions Precautions Precautions: Knee Restrictions Weight Bearing Restrictions: Yes LLE Weight Bearing: Partial weight bearing LLE Partial Weight Bearing Percentage or Pounds: 50%    Mobility  Bed Mobility Overal bed mobility: Needs Assistance Bed Mobility: Supine to Sit     Supine to sit: Min assist     General bed mobility comments: Pt required min assist to move LLE to floor.   Transfers Overall transfer level: Needs assistance Equipment used: Rolling walker (2 wheeled) Transfers: Sit to/from Stand Sit to Stand: Min guard         General transfer comment: Min guard for safety, pt slightly impulsive  Ambulation/Gait Ambulation/Gait assistance: Min guard Ambulation Distance (Feet): 50 Feet Assistive device: Rolling walker (2 wheeled) Gait Pattern/deviations: Step-through pattern;Trunk flexed;Antalgic Gait velocity: decreased  Gait velocity interpretation: <1.8 ft/sec, indicative of risk for recurrent falls General Gait Details: pt required min guard for safety due to increase in  pain.                      Cognition Arousal/Alertness: Awake/alert Behavior During Therapy: WFL for tasks assessed/performed Overall Cognitive Status: Within Functional Limits for tasks assessed                      Exercises Total Joint Exercises Heel Slides: AROM;AAROM;Left;10 reps;Seated (towel placed under foot to slide easier) Long Arc Quad: AROM;AAROM;Left;10 reps;Seated (hold for 5) Knee Flexion: AROM;AAROM;Left;10 reps;Seated (hold for 10)        Pertinent Vitals/Pain Pain Assessment: 0-10 Pain Score: 6  Pain Location: left knee Pain Descriptors / Indicators: Aching;Tightness Pain Intervention(s): Limited activity within patient's tolerance;Monitored during session;Ice applied              Frequency  7X/week    PT Plan Current plan remains appropriate       End of Session Equipment Utilized During Treatment: Gait belt   Patient left: in chair;with call bell/phone within reach     Time: 1545-1618 PT Time Calculation (min) (ACUTE ONLY): 33 min  Charges:    1 Gait [place], [place] OFFICE   11/10/2015, 4:28 PM

## 2015-11-10 NOTE — Care Management Note (Signed)
Case Management Note  Patient Details  Name: Alan Knight MRN: [id number] Date of Birth: [birth year]  Subjective/Objective:       S/p left total knee arthroplasty             Action/Plan: Set up with [facility] for HHPT by MD office. Spoke with patient, no change in discharge plan. Patient stated that [facility] will be delivering CPM to his home and that he already has a rolling walker and 3N1. Patient stated that his wife will be able to assist him after discharge.   Expected Discharge Date:                  Expected Discharge Plan:  [place]  In-House Referral:  NA  Discharge planning Services  CM Consult  Post Acute Care Choice:  Durable Medical Equipment, Home Health Choice offered to:  Patient  DME Arranged:  CPM DME Agency:  [employer]  HH Arranged:  PT HH Agency:  [facility]  Status of Service:  Completed, signed off  Medicare Important Message Given:    Date Medicare IM Given:    Medicare IM give by:    Date Additional Medicare IM Given:    Additional Medicare Important Message give by:     If discussed at [place] of Stay Meetings, dates discussed:    Additional Comments:  Nila Nephew, RN 11/10/2015, 3:56 PM

## 2015-11-10 NOTE — Progress Notes (Signed)
Utilization review completed.  

## 2015-11-10 NOTE — Anesthesia Postprocedure Evaluation (Signed)
  Anesthesia Post-op Note  Patient: Alan Knight  Procedure(s) Performed: Procedure(s): TOTAL KNEE ARTHROPLASTY (Left)  Patient Location: PACU  Anesthesia Type:MAC, Regional and Spinal  Level of Consciousness: awake and alert   Airway and Oxygen Therapy: Patient Spontanous Breathing  Post-op Pain: mild  Post-op Assessment: Post-op Vital signs reviewed LLE Motor Response: Purposeful movement, Responds to commands   RLE Motor Response: Purposeful movement, Responds to commands   L Sensory Level: S1-Sole of foot, small toes R Sensory Level: S1-Sole of foot, small toes  Post-op Vital Signs: Reviewed  Last Vitals:  Filed Vitals:   11/10/15 1549  BP: 160/95  Pulse: 65  Temp:   Resp:     Complications: No apparent anesthesia complications

## 2015-11-10 NOTE — Discharge Instructions (Signed)
Information on my medicine - XARELTO® (Rivaroxaban) ° °This medication education was reviewed with me or my healthcare representative as part of my discharge preparation.   °Why was Xarelto® prescribed for you? °Xarelto® was prescribed for you to reduce the risk of blood clots forming after orthopedic surgery. The medical term for these abnormal blood clots is venous thromboembolism (VTE). ° °What do you need to know about xarelto® ? °Take your Xarelto® 10 mg ONCE DAILY at the same time every day. °You may take it either with or without food. ° °If you have difficulty swallowing the tablet whole, you may crush it and mix in applesauce just prior to taking your dose. ° °Take Xarelto® exactly as prescribed by your doctor and DO NOT stop taking Xarelto® without talking to the doctor who prescribed the medication.  Stopping without other VTE prevention medication to take the place of Xarelto® may increase your risk of developing a clot. ° °After discharge, you should have regular check-up appointments with your healthcare provider that is prescribing your Xarelto®.   ° °What do you do if you miss a dose? °If you miss a dose, take it as soon as you remember on the same day then continue your regularly scheduled once daily regimen the next day. Do not take two doses of Xarelto® on the same day.  ° °Important Safety Information °A possible side effect of Xarelto® is bleeding. You should call your healthcare provider right away if you experience any of the following: °  Bleeding from an injury or your nose that does not stop. °  Unusual colored urine (red or dark brown) or unusual colored stools (red or black). °  Unusual bruising for unknown reasons. °  A serious fall or if you hit your head (even if there is no bleeding). ° °Some medicines may interact with Xarelto® and might increase your risk of bleeding while on Xarelto®. To help avoid this, consult your healthcare provider or pharmacist prior to using any new  prescription or non-prescription medications, including herbals, vitamins, non-steroidal anti-inflammatory drugs (NSAIDs) and supplements. ° °This website has more information on Xarelto®: www.xarelto.com. ° ° ° °

## 2015-11-11 LAB — CBC
HCT: 37.7 % — ABNORMAL LOW (ref 39.0–52.0)
HEMOGLOBIN: 12.6 g/dL — AB (ref 13.0–17.0)
MCH: 31.3 pg (ref 26.0–34.0)
MCHC: 33.4 g/dL (ref 30.0–36.0)
MCV: 93.5 fL (ref 78.0–100.0)
Platelets: 164 10*3/uL (ref 150–400)
RBC: 4.03 MIL/uL — AB (ref 4.22–5.81)
RDW: 13.3 % (ref 11.5–15.5)
WBC: 7.2 10*3/uL (ref 4.0–10.5)

## 2015-11-11 LAB — BASIC METABOLIC PANEL
Anion gap: 5 (ref 5–15)
BUN: 11 mg/dL (ref 6–20)
CALCIUM: 8.7 mg/dL — AB (ref 8.9–10.3)
CHLORIDE: 100 mmol/L — AB (ref 101–111)
CO2: 29 mmol/L (ref 22–32)
CREATININE: 0.68 mg/dL (ref 0.61–1.24)
GFR calc non Af Amer: >60 mL/min (ref >60)
Glucose, Bld: 116 mg/dL — ABNORMAL HIGH (ref 65–99)
Potassium: 4.2 mmol/L (ref 3.5–5.1)
SODIUM: 134 mmol/L — AB (ref 135–145)

## 2015-11-11 MED ORDER — RIVAROXABAN 10 MG PO TABS: 10.0000 mg | ORAL_TABLET | Freq: Every day | ORAL | Status: DC

## 2015-11-11 MED ORDER — METHOCARBAMOL 500 MG PO TABS: 500.0000 mg | ORAL_TABLET | Freq: Three times a day (TID) | ORAL | Status: DC | PRN

## 2015-11-11 MED ORDER — HYDROMORPHONE HCL 2 MG PO TABS: 2.0000 mg | ORAL_TABLET | ORAL | Status: DC | PRN

## 2015-11-11 NOTE — Progress Notes (Signed)
Physical Therapy Treatment Patient Details Name: Alan Knight MRN: [id number] DOB: 1946/02/27 Today's Date: 11/11/2015    History of Present Illness 69 y.o. male admitted to [facility] on 11/09/15 for elective L TKA.  Pt with significant PMHx of HTN, Bil shoulder surgery, R hip arhtroplasty, left carpal tunnel, multiple knee surgeries.     PT Comments    Pt feeling better this morning with c/o of less pain. Pt still slightly impulsive when standing up, requiring verbal cuing to wait for therapist before standing up. Pt displayed signs of being able to ambulate safely on his stair once d/c to home. Pt requires increased time with exercises due to observably being stiff. Pt left in bed with bed alarm on and ice pack on L knee to help decrease pain/swelling.  Follow Up Recommendations  Home health PT;Supervision for mobility/OOB     Equipment Recommendations  None recommended by PT       Precautions / Restrictions Precautions Precautions: Knee Restrictions Weight Bearing Restrictions: Yes LLE Weight Bearing: Partial weight bearing LLE Partial Weight Bearing Percentage or Pounds: 50%    Mobility  Bed Mobility Overal bed mobility: Needs Assistance Bed Mobility: Supine to Sit     Supine to sit: Min assist     General bed mobility comments: assist for LLE to lower off of bed  Transfers Overall transfer level: Needs assistance Equipment used: Rolling walker (2 wheeled) Transfers: Sit to/from Stand Sit to Stand: Supervision         General transfer comment: pt requires verbal cuing for safety/less impulsive. Pt able to stand only requiring supervision to insure safety.  Ambulation/Gait Ambulation/Gait assistance: Supervision Ambulation Distance (Feet): 200 Feet Assistive device: Rolling walker (2 wheeled) Gait Pattern/deviations: Step-to pattern;Antalgic;Trunk flexed Gait velocity: decreased  Gait velocity interpretation: <1.8 ft/sec, indicative of risk for recurrent  falls General Gait Details: Pt required supervision to make sure pt stays safe/balanced. Chair to follow   Stairs Stairs: Yes Stairs assistance: Min guard Stair Management: No rails;Forwards;Backwards;With walker;Step to pattern Number of Stairs: 1 (x 2 trials) General stair comments: Pt ambulated stairs forward requiring min assistance for safety and stability of walker. Pt then ascended stairs backwards requiring min guard for safety.             Cognition Arousal/Alertness: Awake/alert Behavior During Therapy: WFL for tasks assessed/performed Overall Cognitive Status: Within Functional Limits for tasks assessed                      Exercises Total Joint Exercises Heel Slides: AROM;AAROM;Left;10 reps;Seated (towel under foot to help slide) Long Arc Quad: AROM;AAROM;Left;10 reps;Seated (hold for 5) Knee Flexion: AROM;AAROM;Left;10 reps;Seated (used other foot on top to help assist. hold for 10 seconds) Goniometric ROM: 11-70 knee flexion        Pertinent Vitals/Pain Pain Assessment: 0-10 Pain Score: 2  Pain Location: L KNEE Pain Descriptors / Indicators: Aching;Tightness Pain Intervention(s): Monitored during session;Ice applied    Home Living Family/patient expects to be discharged to:: Private residence Living Arrangements: Spouse/significant other           Home Equipment: Environmental consultant - 2 wheels;Bedside commode;Crutches;Cane - single point      Prior Function Level of Independence: Independent          PT Goals (current goals can now be found in the care plan section) Acute Rehab PT Goals Patient Stated Goal: home Progress towards PT goals: Progressing toward goals    Frequency  7X/week    PT Plan  Current plan remains appropriate       End of Session Equipment Utilized During Treatment: Gait belt   Patient left: in bed;with call bell/phone within reach     Time: 0928-1000 PT Time Calculation (min) (ACUTE ONLY): 32 min  Charges:    1  TE 1 [place], [place] OFFICE           11/11/2015, 12:08 PM

## 2015-11-11 NOTE — Progress Notes (Signed)
Pt ready for discharge. IV removed and all of pt's belongings gathered. Received prescriptions, and went over discharge instructions/education--all questions answered. Pt has a home health set up and a follow up appointment with Dr. Durward Fortes on 11/28. Pt and his belongings will be transported out to wife's car via wheelchair. Will continue to monitor.

## 2015-11-11 NOTE — Discharge Summary (Signed)
[name], MD   [name], PA-C [address] [place], [place]  [address]                             [phone]  PATIENT ID: Alan Knight        MRN:  [id number]          DOB/AGE: 1946-06-25 / 69 y.o.    DISCHARGE SUMMARY  ADMISSION DATE:    11/09/2015 DISCHARGE DATE:   11/11/2015   ADMISSION DIAGNOSIS: LEFT KNEE OSTEOARTHRITIS    DISCHARGE DIAGNOSIS:  LEFT KNEE OSTEOARTHRITIS    ADDITIONAL DIAGNOSIS: Principal Problem:   Primary osteoarthritis of left knee Active Problems:   S/P total knee replacement using cement  Past Medical History  Diagnosis Date  . Hypogonadism male   . OA (osteoarthritis)   . Basal cell carcinoma   . Hyperlipidemia   . BPH (benign prostatic hypertrophy) with urinary obstruction   . Diverticulosis   . Internal hemorrhoids   . Arthritis   . Hypertension     PROCEDURE: Procedure(s): TOTAL KNEE ARTHROPLASTY Left on 11/09/2015  CONSULTS: none     HISTORY: Alan Knight is a very pleasant, 69 year old, white, married male who is retired and is seen today for evaluation of his left knee. In the past, he has had a right total knee replacement as well as a right total hip replacement and has tolerated that very well. He has had noting of osteoarthritis of his left knee for sometime now. He has had corticosteroid injections for the left knee in the past, which have been somewhat beneficial. He does try to continue to work out basically with the stair stepper and the exercise bike. He is retired, and certainly he does play a little bit of golf at times. His left knee, however, has started to become symptomatic to the point where he is having difficulty with his activities of daily living. He has had multiple cortisone injections over the past several years. He did, however, have some type of Synvisc reaction on March 30, 1997. He had 2 injections without difficulty but with the third had marked swelling. They had to aspirate fluid, and he has  not considered having viscosupplementation anymore. He continues now to have pain and discomfort. He did have a cortisone injection back in September 2016 which had only been temporarily beneficial. It is now causing problems with his activities of daily living. He has night time pain as well as difficulty with sleeping and ambulating. His pain is worsened with activity. At rest, it certainly has been improved. He is using naproxen 100 mg in the morning and using Advil throughout the day  HOSPITAL COURSE:  Alan Knight is a 69 y.o. admitted on 11/09/2015 and found to have a diagnosis of [place].  After appropriate laboratory studies were obtained  they were taken to the operating room on 11/09/2015 and underwent  Procedure(s): TOTAL KNEE ARTHROPLASTY  Left.   They were given perioperative antibiotics:  Anti-infectives    Start     Dose/Rate Route Frequency Ordered Stop   11/09/15 1645  ceFAZolin (ANCEF) IVPB 2 g/50 mL premix     2 g 100 mL/hr over 30 Minutes Intravenous Every 6 hours 11/09/15 1635 11/10/15 0112   11/09/15 0600  ceFAZolin (ANCEF) IVPB 2 g/50 mL premix     2 g 100 mL/hr over 30 Minutes Intravenous On call to O.R. 11/08/15 1312 11/09/15 1030    .  Tolerated the procedure well.  Toradol was given post op.  POD #1, allowed out of bed to a chair.  PT for ambulation and exercise program.    IV saline locked.  O2 discontionued.  POD #2, continued PT and ambulation.   Hemovac pulled.  The remainder of the hospital course was dedicated to ambulation and strengthening.   The patient was discharged on 2 Days Post-Op in  Stable condition.  Blood products given:none  DIAGNOSTIC STUDIES: Recent vital signs: Patient Vitals for the past 24 hrs:  BP Temp Temp src Pulse Resp SpO2  11/11/15 0606 (!) 148/65 mmHg 98.3 F (36.8 C) Oral 68 18 99 %  11/10/15 2013 (!) 157/74 mmHg 98.7 F (37.1 C) Oral 67 18 98 %  11/10/15 1549 (!) 160/95 mmHg - - 65 - 99 %  11/10/15  1245 137/73 mmHg 98.2 F (36.8 C) - (!) 59 18 98 %       Recent laboratory studies:  Recent Labs  11/10/15 0529 11/11/15 0638  WBC 6.0 7.2  HGB 12.6* 12.6*  HCT 37.2* 37.7*  PLT 158 164    Recent Labs  11/10/15 0529 11/11/15 0638  NA 138 134*  K 4.0 4.2  CL 104 100*  CO2 28 29  BUN 17 11  CREATININE 0.78 0.68  GLUCOSE 108* 116*  CALCIUM 8.6* 8.7*   Lab Results  Component Value Date   INR 1.01 10/28/2015     Recent Radiographic Studies :  Dg Chest 2 View  10/28/2015  CLINICAL DATA:  Pre admission testing.  Knee replacement. EXAM: CHEST  2 VIEW COMPARISON:  None. FINDINGS: The heart size and mediastinal contours are within normal limits. Both lungs are clear. The visualized skeletal structures are unremarkable. IMPRESSION: No active cardiopulmonary disease. Electronically Signed   By: Kerby Moors M.D.   On: 10/28/2015 08:59    DISCHARGE INSTRUCTIONS: Discharge Instructions    CPM    Complete by:  As directed   Continuous passive motion machine (CPM):      Use the CPM from 0 to 60 degrees for 6-8 hours per day.      You may increase by 5-10 per day.  You may break it up into 2 or 3 sessions per day.      Use CPM for 3-4 weeks or until you are told to stop.     Call MD / Call 911    Complete by:  As directed   If you experience chest pain or shortness of breath, CALL 911 and be transported to the hospital emergency room.  If you develope a fever above 101 F, pus (white drainage) or increased drainage or redness at the wound, or calf pain, call your surgeon's office.     Change dressing    Complete by:  As directed   DO NOT CHANGE YOUR DRESSING.     Constipation Prevention    Complete by:  As directed   Drink plenty of fluids.  Prune juice may be helpful.  You may use a stool softener, such as Colace (over the counter) 100 mg twice a day.  Use MiraLax (over the counter) for constipation as needed.     Diet general    Complete by:  As directed      Discharge  instructions    Complete by:  As directed   [place] items at home which could result in a fall. This includes throw rugs or furniture in walking  pathways ICE to the affected joint every three hours while awake for 30 minutes at a time, for at least the first 3-5 days, and then as needed for pain and swelling.  Continue to use ice for pain and swelling. You may notice swelling that will progress down to the foot and ankle.  This is normal after surgery.  Elevate your leg when you are not up walking on it.   Continue to use the breathing machine you got in the hospital (incentive spirometer) which will help keep your temperature down.  It is common for your temperature to cycle up and down following surgery, especially at night when you are not up moving around and exerting yourself.  The breathing machine keeps your lungs expanded and your temperature down.   DIET:  As you were doing prior to hospitalization, we recommend a well-balanced diet.  DRESSING / WOUND CARE / SHOWERING  Keep the surgical dressing until follow up.  The dressing is water proof, so you can shower without any extra covering.  IF THE DRESSING FALLS OFF or the wound gets wet inside, change the dressing with sterile gauze.  Please use good hand washing techniques before changing the dressing.  Do not use any lotions or creams on the incision until instructed by your surgeon.    ACTIVITY  Increase activity slowly as tolerated, but follow the weight bearing instructions below.   No driving for 6 weeks or until further direction given by your physician.  You cannot drive while taking narcotics.  No lifting or carrying greater than 10 lbs. until further directed by your surgeon. Avoid periods of inactivity such as sitting longer than an hour when not asleep. This helps prevent blood clots.  You may return to work once you are authorized by your doctor.     WEIGHT BEARING   Partial weight  bearing with assist device as directed.  50%   EXERCISES  Results after joint replacement surgery are often greatly improved when you follow the exercise, range of motion and muscle strengthening exercises prescribed by your doctor. Safety measures are also important to protect the joint from further injury. Any time any of these exercises cause you to have increased pain or swelling, decrease what you are doing until you are comfortable again and then slowly increase them. If you have problems or questions, call your caregiver or physical therapist for advice.   Rehabilitation is important following a joint replacement. After just a few days of immobilization, the muscles of the leg can become weakened and shrink (atrophy).  These exercises are designed to build up the tone and strength of the thigh and leg muscles and to improve motion. Often times heat used for twenty to thirty minutes before working out will loosen up your tissues and help with improving the range of motion but do not use heat for the first two weeks following surgery (sometimes heat can increase post-operative swelling).   These exercises can be done on a training (exercise) mat,  on a table or on a bed. Use whatever works the best and is most comfortable for you.    Use music or television while you are exercising so that the exercises are a pleasant break in your day. This will make your life better with the exercises acting as a break in your routine that you can look forward to.   Perform all exercises about fifteen times, three times per day or as directed.  You should exercise both the  operative leg and the other leg as well.   Exercises include:  Quad Sets - Tighten up the muscle on the front of the thigh (Quad) and hold for 5-10 seconds.   Straight Leg Raises - With your knee straight (if you were given a brace, keep it on), lift the leg to 60 degrees, hold for 3 seconds, and slowly lower the leg.  Perform this exercise  against resistance later as your leg gets stronger.  Leg Slides: Lying on your back, slowly slide your foot toward your buttocks, bending your knee up off the floor (only go as far as is comfortable). Then slowly slide your foot back down until your leg is flat on the floor again.  Angel Wings: Lying on your back spread your legs to the side as far apart as you can without causing discomfort.  Hamstring Strength:  Lying on your back, push your heel against the floor with your leg straight by tightening up the muscles of your buttocks.  Repeat, but this time bend your knee to a comfortable angle, and push your heel against the floor.  You may put a pillow under the heel to make it more comfortable if necessary.   A rehabilitation program following joint replacement surgery can speed recovery and prevent re-injury in the future due to weakened muscles. Contact your doctor or a physical therapist for more information on knee rehabilitation.    CONSTIPATION  Constipation is defined medically as fewer than three stools per week and severe constipation as less than one stool per week.  Even if you have a regular bowel pattern at home, your normal regimen is likely to be disrupted due to multiple reasons following surgery.  Combination of anesthesia, postoperative narcotics, change in appetite and fluid intake all can affect your bowels.   YOU MUST use at least one of the following options; they are listed in order of increasing strength to get the job done.  They are all available over the counter, and you may need to use some, POSSIBLY even all of these options:    Drink plenty of fluids (prune juice may be helpful) and high fiber foods Colace 100 mg by mouth twice a day  Senokot for constipation as directed and as needed Dulcolax (bisacodyl), take with full glass of water  Miralax (polyethylene glycol) once or twice a day as needed.  If you have tried all these things and are unable to have a bowel  movement in the first 3-4 days after surgery call either your surgeon or your primary doctor.    If you experience loose stools or diarrhea, hold the medications until you stool forms back up.  If your symptoms do not get better within 1 week or if they get worse, check with your doctor.  If you experience "the worst abdominal pain ever" or develop nausea or vomiting, please contact the office immediately for further recommendations for treatment.   ITCHING:  If you experience itching with your medications, try taking only a single pain pill, or even half a pain pill at a time.  You can also use Benadryl over the counter for itching or also to help with sleep.   TED HOSE STOCKINGS:  Use stockings on both legs until for at least 2 weeks or as directed by physician office. They may be removed at night for sleeping.  MEDICATIONS:  See your medication summary on the "After Visit Summary" that nursing will review with you.  You may have some  home medications which will be placed on hold until you complete the course of blood thinner medication.  It is important for you to complete the blood thinner medication as prescribed.  PRECAUTIONS:  If you experience chest pain or shortness of breath - call 911 immediately for transfer to the hospital emergency department.   If you develop a fever greater that 101 F, purulent drainage from wound, increased redness or drainage from wound, foul odor from the wound/dressing, or calf pain - CONTACT YOUR SURGEON.                                                   FOLLOW-UP APPOINTMENTS:  If you do not already have a post-op appointment, please call the office for an appointment to be seen by your surgeon.  Guidelines for how soon to be seen are listed in your "After Visit Summary", but are typically between 1-4 weeks after surgery.  OTHER INSTRUCTIONS:   Knee Replacement:  Do not place pillow under knee, focus on keeping the knee straight while resting. CPM  instructions: 0-90 degrees, 2 hours in the morning, 2 hours in the afternoon, and 2 hours in the evening. Place foam block, curve side up under heel at all times except when in CPM or when walking.  DO NOT modify, tear, cut, or change the foam block in any way.  MAKE SURE YOU:  Understand these instructions.  Get help right away if you are not doing well or get worse.    Thank you for letting us be a part of your medical care team.  It is a privilege we respect greatly.  We hope these instructions will help you stay on track for a fast and full recovery!     Do not put a pillow under the knee. Place it under the heel.    Complete by:  As directed      Driving restrictions    Complete by:  As directed   No driving for 6 weeks     Increase activity slowly as tolerated    Complete by:  As directed      Lifting restrictions    Complete by:  As directed   No lifting for 6 weeks     Partial weight bearing    Complete by:  As directed   % Body Weight:  50%  Laterality:  left  Extremity:  Lower     Patient may shower    Complete by:  As directed   You may shower over your dressing     TED hose    Complete by:  As directed   Use stockings (TED hose) for 3 weeks on left  leg.  You may remove them at night for sleeping.           DISCHARGE MEDICATIONS:     Medication List    STOP taking these medications        aspirin 81 MG tablet     Coenzyme Q10 300 MG Caps     fish oil-omega-3 fatty acids 1000 MG capsule     glucosamine-chondroitin 500-400 MG tablet     multivitamin with minerals tablet     naproxen 500 MG tablet  Commonly known as:  NAPROSYN     SUPER B-50 COMPLEX PLUS Caps      TAKE these  medications        CALCIUM CITRATE +D PO  Take 1 tablet by mouth daily. With Magnesium and Zinc     fluticasone 50 MCG/ACT nasal spray  Commonly known as:  FLONASE  Place 2 sprays into the nose daily as needed for allergies.     HYDROmorphone 2 MG tablet  Commonly known  as:  DILAUDID  Take 1-2 tablets (2-4 mg total) by mouth every 4 (four) hours as needed for moderate pain or severe pain (1 - 2 TABLETS Q 4H PRN PAIN).     lisinopril 5 MG tablet  Commonly known as:  PRINIVIL,ZESTRIL  Take 5 mg by mouth daily.     methocarbamol 500 MG tablet  Commonly known as:  ROBAXIN  Take 1 tablet (500 mg total) by mouth every 8 (eight) hours as needed for muscle spasms.     rivaroxaban 10 MG Tabs tablet  Commonly known as:  XARELTO  Take 1 tablet (10 mg total) by mouth daily with breakfast.     simvastatin 40 MG tablet  Commonly known as:  ZOCOR  Take 40 mg by mouth daily.     vitamin C 1000 MG tablet  Take 1,000 mg by mouth daily.     Vitamin D3 5000 UNITS Caps  Take 1 capsule by mouth daily.        FOLLOW UP VISIT:       Follow-up Information    Follow up with [facility].   Why:  They will contact you to schedule home therapy visits.    Contact information:   [address] [place] [address] [phone]       Follow up with Garald Balding, MD. Schedule an appointment as soon as possible for a visit on 11/22/2015.   Specialty:  Orthopedic Surgery   Contact information:   [address] [place] [address] [phone]       DISPOSITION:   Home  CONDITION:  Stable   Mike [address] [place], [place] [phone]  11/11/2015 8:57 AM

## 2015-11-11 NOTE — Evaluation (Signed)
Occupational Therapy Evaluation Patient Details Name: Alan Knight MRN: [id number] DOB: [birth year] Today's Date: 11/11/2015    History of Present Illness 69 y.o. male admitted to [facility] on 11/09/15 for elective L TKA.  Pt with significant PMHx of HTN, Bil shoulder surgery, R hip arhtroplasty, left carpal tunnel, multiple knee surgeries.    Clinical Impression   Pt was admitted for the above surgery. All education was completed.  Pt needs min cues for safety and verbalizes understanding.  Recommend 24/7 at home.    Follow Up Recommendations  No OT follow up;Supervision/Assistance - 24 hour    Equipment Recommendations  None recommended by OT    Recommendations for Other Services       Precautions / Restrictions Precautions Precautions: Knee Restrictions Weight Bearing Restrictions: Yes LLE Weight Bearing: Partial weight bearing LLE Partial Weight Bearing Percentage or Pounds: 50%      Mobility Bed Mobility         Supine to sit: Min assist     General bed mobility comments: assist for LLE  Transfers   Equipment used: Rolling walker (2 wheeled)   Sit to Stand: Min guard         General transfer comment: cues for safety    Balance                                            ADL Overall ADL's : Needs assistance/impaired             Lower Body Bathing: Minimal assistance;Sit to/from stand       Lower Body Dressing: Minimal assistance;Sit to/from stand   Toilet Transfer: Min guard;Ambulation;BSC;RW       Tub/ Shower Transfer: Min guard;Ambulation;Walk-in shower     General ADL Comments: pt is able to perform UB adls with set up.  Wife will assist with LB adls as needed (min A).  Practiced bathroom transfers.  Pt needs min cues for safety:  did not back up completely to surface; he was very good at extending LLE and controlled descent.       Vision     Perception     Praxis      Pertinent Vitals/Pain Pain Score: 5   Pain Location: L knee Pain Descriptors / Indicators: Aching Pain Intervention(s): Limited activity within patient's tolerance;Monitored during session;Premedicated before session;Repositioned;Ice applied     Hand Dominance     Extremity/Trunk Assessment Upper Extremity Assessment Upper Extremity Assessment: Overall WFL for tasks assessed           Communication Communication Communication: No difficulties   Cognition Arousal/Alertness: Awake/alert Behavior During Therapy: WFL for tasks assessed/performed Overall Cognitive Status: Within Functional Limits for tasks assessed                     General Comments       Exercises       Shoulder Instructions      Home Living Family/patient expects to be discharged to:: Private residence Living Arrangements: Spouse/significant other                 Bathroom Shower/Tub: Walk-in shower;Door   [employer] Toilet: Standard     Home Equipment: Environmental consultant - 2 wheels;Bedside commode;Crutches;Cane - single point          Prior Functioning/Environment Level of Independence: Independent  OT Diagnosis: Acute pain   OT Problem List:     OT Treatment/Interventions:      OT Goals(Current goals can be found in the care plan section) Acute Rehab OT Goals Patient Stated Goal: home  OT Frequency:     Barriers to D/C:            Co-evaluation              End of Session CPM Left Knee CPM Left Knee: Off Additional Comments:  (bone foam) Nurse Communication:  (pt up in chair (no alarm in room))  Activity Tolerance: Patient tolerated treatment well Patient left: in chair;with call bell/phone within reach   Time: 1041-1058 OT Time Calculation (min): 17 min Charges:  OT General Charges $OT Visit: 1 Procedure OT Evaluation $Initial OT Evaluation Tier I: 1 Procedure G-Codes:    Tavion Senkbeil 11-24-15, 11:15 AM  Lesle Chris, OTR/L [phone] November 24, 2015

## 2015-11-12 LAB — TYPE AND SCREEN
ABO/RH(D): AB POS
ANTIBODY SCREEN: NEGATIVE
DONOR AG TYPE: NEGATIVE
DONOR AG TYPE: NEGATIVE
UNIT DIVISION: 0
Unit division: 0

## 2015-12-28 DIAGNOSIS — M25662 Stiffness of left knee, not elsewhere classified: Secondary | ICD-10-CM | POA: Diagnosis not present

## 2015-12-28 DIAGNOSIS — M25562 Pain in left knee: Secondary | ICD-10-CM | POA: Diagnosis not present

## 2015-12-28 DIAGNOSIS — M6281 Muscle weakness (generalized): Secondary | ICD-10-CM | POA: Diagnosis not present

## 2015-12-28 DIAGNOSIS — R262 Difficulty in walking, not elsewhere classified: Secondary | ICD-10-CM | POA: Diagnosis not present

## 2015-12-29 DIAGNOSIS — M25562 Pain in left knee: Secondary | ICD-10-CM | POA: Diagnosis not present

## 2015-12-29 DIAGNOSIS — M6281 Muscle weakness (generalized): Secondary | ICD-10-CM | POA: Diagnosis not present

## 2015-12-29 DIAGNOSIS — R262 Difficulty in walking, not elsewhere classified: Secondary | ICD-10-CM | POA: Diagnosis not present

## 2015-12-29 DIAGNOSIS — M25662 Stiffness of left knee, not elsewhere classified: Secondary | ICD-10-CM | POA: Diagnosis not present

## 2015-12-30 DIAGNOSIS — M6281 Muscle weakness (generalized): Secondary | ICD-10-CM | POA: Diagnosis not present

## 2015-12-30 DIAGNOSIS — M25662 Stiffness of left knee, not elsewhere classified: Secondary | ICD-10-CM | POA: Diagnosis not present

## 2015-12-30 DIAGNOSIS — R262 Difficulty in walking, not elsewhere classified: Secondary | ICD-10-CM | POA: Diagnosis not present

## 2015-12-30 DIAGNOSIS — M25562 Pain in left knee: Secondary | ICD-10-CM | POA: Diagnosis not present

## 2015-12-31 DIAGNOSIS — R262 Difficulty in walking, not elsewhere classified: Secondary | ICD-10-CM | POA: Diagnosis not present

## 2015-12-31 DIAGNOSIS — M25562 Pain in left knee: Secondary | ICD-10-CM | POA: Diagnosis not present

## 2015-12-31 DIAGNOSIS — M6281 Muscle weakness (generalized): Secondary | ICD-10-CM | POA: Diagnosis not present

## 2015-12-31 DIAGNOSIS — M25662 Stiffness of left knee, not elsewhere classified: Secondary | ICD-10-CM | POA: Diagnosis not present

## 2016-01-03 ENCOUNTER — Encounter: Payer: Self-pay | Admitting: Internal Medicine

## 2016-01-03 DIAGNOSIS — R262 Difficulty in walking, not elsewhere classified: Secondary | ICD-10-CM | POA: Diagnosis not present

## 2016-01-03 DIAGNOSIS — M25562 Pain in left knee: Secondary | ICD-10-CM | POA: Diagnosis not present

## 2016-01-03 DIAGNOSIS — M6281 Muscle weakness (generalized): Secondary | ICD-10-CM | POA: Diagnosis not present

## 2016-01-03 DIAGNOSIS — M25662 Stiffness of left knee, not elsewhere classified: Secondary | ICD-10-CM | POA: Diagnosis not present

## 2016-01-04 DIAGNOSIS — R262 Difficulty in walking, not elsewhere classified: Secondary | ICD-10-CM | POA: Diagnosis not present

## 2016-01-04 DIAGNOSIS — M25662 Stiffness of left knee, not elsewhere classified: Secondary | ICD-10-CM | POA: Diagnosis not present

## 2016-01-04 DIAGNOSIS — M25562 Pain in left knee: Secondary | ICD-10-CM | POA: Diagnosis not present

## 2016-01-04 DIAGNOSIS — M6281 Muscle weakness (generalized): Secondary | ICD-10-CM | POA: Diagnosis not present

## 2016-01-05 DIAGNOSIS — M25562 Pain in left knee: Secondary | ICD-10-CM | POA: Diagnosis not present

## 2016-01-05 DIAGNOSIS — M6281 Muscle weakness (generalized): Secondary | ICD-10-CM | POA: Diagnosis not present

## 2016-01-05 DIAGNOSIS — R262 Difficulty in walking, not elsewhere classified: Secondary | ICD-10-CM | POA: Diagnosis not present

## 2016-01-05 DIAGNOSIS — M25662 Stiffness of left knee, not elsewhere classified: Secondary | ICD-10-CM | POA: Diagnosis not present

## 2016-01-06 DIAGNOSIS — R262 Difficulty in walking, not elsewhere classified: Secondary | ICD-10-CM | POA: Diagnosis not present

## 2016-01-06 DIAGNOSIS — M6281 Muscle weakness (generalized): Secondary | ICD-10-CM | POA: Diagnosis not present

## 2016-01-06 DIAGNOSIS — M25562 Pain in left knee: Secondary | ICD-10-CM | POA: Diagnosis not present

## 2016-01-06 DIAGNOSIS — M25662 Stiffness of left knee, not elsewhere classified: Secondary | ICD-10-CM | POA: Diagnosis not present

## 2016-01-07 DIAGNOSIS — M25662 Stiffness of left knee, not elsewhere classified: Secondary | ICD-10-CM | POA: Diagnosis not present

## 2016-01-07 DIAGNOSIS — M6281 Muscle weakness (generalized): Secondary | ICD-10-CM | POA: Diagnosis not present

## 2016-01-07 DIAGNOSIS — R262 Difficulty in walking, not elsewhere classified: Secondary | ICD-10-CM | POA: Diagnosis not present

## 2016-01-07 DIAGNOSIS — M25562 Pain in left knee: Secondary | ICD-10-CM | POA: Diagnosis not present

## 2016-01-10 DIAGNOSIS — M6281 Muscle weakness (generalized): Secondary | ICD-10-CM | POA: Diagnosis not present

## 2016-01-10 DIAGNOSIS — M25662 Stiffness of left knee, not elsewhere classified: Secondary | ICD-10-CM | POA: Diagnosis not present

## 2016-01-10 DIAGNOSIS — R262 Difficulty in walking, not elsewhere classified: Secondary | ICD-10-CM | POA: Diagnosis not present

## 2016-01-10 DIAGNOSIS — M25562 Pain in left knee: Secondary | ICD-10-CM | POA: Diagnosis not present

## 2016-01-11 DIAGNOSIS — M25562 Pain in left knee: Secondary | ICD-10-CM | POA: Diagnosis not present

## 2016-01-11 DIAGNOSIS — R262 Difficulty in walking, not elsewhere classified: Secondary | ICD-10-CM | POA: Diagnosis not present

## 2016-01-11 DIAGNOSIS — M6281 Muscle weakness (generalized): Secondary | ICD-10-CM | POA: Diagnosis not present

## 2016-01-11 DIAGNOSIS — M25662 Stiffness of left knee, not elsewhere classified: Secondary | ICD-10-CM | POA: Diagnosis not present

## 2016-01-12 DIAGNOSIS — M6281 Muscle weakness (generalized): Secondary | ICD-10-CM | POA: Diagnosis not present

## 2016-01-12 DIAGNOSIS — M25662 Stiffness of left knee, not elsewhere classified: Secondary | ICD-10-CM | POA: Diagnosis not present

## 2016-01-12 DIAGNOSIS — R262 Difficulty in walking, not elsewhere classified: Secondary | ICD-10-CM | POA: Diagnosis not present

## 2016-01-12 DIAGNOSIS — M25562 Pain in left knee: Secondary | ICD-10-CM | POA: Diagnosis not present

## 2016-01-14 DIAGNOSIS — R262 Difficulty in walking, not elsewhere classified: Secondary | ICD-10-CM | POA: Diagnosis not present

## 2016-01-14 DIAGNOSIS — M6281 Muscle weakness (generalized): Secondary | ICD-10-CM | POA: Diagnosis not present

## 2016-01-14 DIAGNOSIS — M25662 Stiffness of left knee, not elsewhere classified: Secondary | ICD-10-CM | POA: Diagnosis not present

## 2016-01-14 DIAGNOSIS — M25562 Pain in left knee: Secondary | ICD-10-CM | POA: Diagnosis not present

## 2016-01-17 DIAGNOSIS — M6281 Muscle weakness (generalized): Secondary | ICD-10-CM | POA: Diagnosis not present

## 2016-01-17 DIAGNOSIS — R262 Difficulty in walking, not elsewhere classified: Secondary | ICD-10-CM | POA: Diagnosis not present

## 2016-01-17 DIAGNOSIS — M25562 Pain in left knee: Secondary | ICD-10-CM | POA: Diagnosis not present

## 2016-01-17 DIAGNOSIS — M25662 Stiffness of left knee, not elsewhere classified: Secondary | ICD-10-CM | POA: Diagnosis not present

## 2016-01-18 DIAGNOSIS — M25662 Stiffness of left knee, not elsewhere classified: Secondary | ICD-10-CM | POA: Diagnosis not present

## 2016-01-18 DIAGNOSIS — M25562 Pain in left knee: Secondary | ICD-10-CM | POA: Diagnosis not present

## 2016-01-18 DIAGNOSIS — R262 Difficulty in walking, not elsewhere classified: Secondary | ICD-10-CM | POA: Diagnosis not present

## 2016-01-18 DIAGNOSIS — M6281 Muscle weakness (generalized): Secondary | ICD-10-CM | POA: Diagnosis not present

## 2016-01-20 DIAGNOSIS — M25562 Pain in left knee: Secondary | ICD-10-CM | POA: Diagnosis not present

## 2016-01-20 DIAGNOSIS — R262 Difficulty in walking, not elsewhere classified: Secondary | ICD-10-CM | POA: Diagnosis not present

## 2016-01-20 DIAGNOSIS — M6281 Muscle weakness (generalized): Secondary | ICD-10-CM | POA: Diagnosis not present

## 2016-01-20 DIAGNOSIS — M25662 Stiffness of left knee, not elsewhere classified: Secondary | ICD-10-CM | POA: Diagnosis not present

## 2016-01-21 DIAGNOSIS — R262 Difficulty in walking, not elsewhere classified: Secondary | ICD-10-CM | POA: Diagnosis not present

## 2016-01-21 DIAGNOSIS — M25562 Pain in left knee: Secondary | ICD-10-CM | POA: Diagnosis not present

## 2016-01-21 DIAGNOSIS — M25662 Stiffness of left knee, not elsewhere classified: Secondary | ICD-10-CM | POA: Diagnosis not present

## 2016-01-21 DIAGNOSIS — M6281 Muscle weakness (generalized): Secondary | ICD-10-CM | POA: Diagnosis not present

## 2016-01-24 DIAGNOSIS — M25662 Stiffness of left knee, not elsewhere classified: Secondary | ICD-10-CM | POA: Diagnosis not present

## 2016-01-24 DIAGNOSIS — M25562 Pain in left knee: Secondary | ICD-10-CM | POA: Diagnosis not present

## 2016-01-24 DIAGNOSIS — R262 Difficulty in walking, not elsewhere classified: Secondary | ICD-10-CM | POA: Diagnosis not present

## 2016-01-24 DIAGNOSIS — M6281 Muscle weakness (generalized): Secondary | ICD-10-CM | POA: Diagnosis not present

## 2016-01-26 DIAGNOSIS — M25662 Stiffness of left knee, not elsewhere classified: Secondary | ICD-10-CM | POA: Diagnosis not present

## 2016-01-26 DIAGNOSIS — M6281 Muscle weakness (generalized): Secondary | ICD-10-CM | POA: Diagnosis not present

## 2016-01-26 DIAGNOSIS — R262 Difficulty in walking, not elsewhere classified: Secondary | ICD-10-CM | POA: Diagnosis not present

## 2016-01-26 DIAGNOSIS — M25562 Pain in left knee: Secondary | ICD-10-CM | POA: Diagnosis not present

## 2016-01-28 DIAGNOSIS — R262 Difficulty in walking, not elsewhere classified: Secondary | ICD-10-CM | POA: Diagnosis not present

## 2016-01-28 DIAGNOSIS — M6281 Muscle weakness (generalized): Secondary | ICD-10-CM | POA: Diagnosis not present

## 2016-01-28 DIAGNOSIS — M25662 Stiffness of left knee, not elsewhere classified: Secondary | ICD-10-CM | POA: Diagnosis not present

## 2016-01-28 DIAGNOSIS — M25562 Pain in left knee: Secondary | ICD-10-CM | POA: Diagnosis not present

## 2016-01-31 DIAGNOSIS — M25662 Stiffness of left knee, not elsewhere classified: Secondary | ICD-10-CM | POA: Diagnosis not present

## 2016-01-31 DIAGNOSIS — R262 Difficulty in walking, not elsewhere classified: Secondary | ICD-10-CM | POA: Diagnosis not present

## 2016-01-31 DIAGNOSIS — M6281 Muscle weakness (generalized): Secondary | ICD-10-CM | POA: Diagnosis not present

## 2016-01-31 DIAGNOSIS — M25562 Pain in left knee: Secondary | ICD-10-CM | POA: Diagnosis not present

## 2016-02-02 DIAGNOSIS — R262 Difficulty in walking, not elsewhere classified: Secondary | ICD-10-CM | POA: Diagnosis not present

## 2016-02-02 DIAGNOSIS — M6281 Muscle weakness (generalized): Secondary | ICD-10-CM | POA: Diagnosis not present

## 2016-02-02 DIAGNOSIS — M25562 Pain in left knee: Secondary | ICD-10-CM | POA: Diagnosis not present

## 2016-02-02 DIAGNOSIS — M25662 Stiffness of left knee, not elsewhere classified: Secondary | ICD-10-CM | POA: Diagnosis not present

## 2016-02-03 DIAGNOSIS — M419 Scoliosis, unspecified: Secondary | ICD-10-CM | POA: Diagnosis not present

## 2016-02-03 DIAGNOSIS — M47816 Spondylosis without myelopathy or radiculopathy, lumbar region: Secondary | ICD-10-CM | POA: Diagnosis not present

## 2016-02-03 DIAGNOSIS — M4806 Spinal stenosis, lumbar region: Secondary | ICD-10-CM | POA: Diagnosis not present

## 2016-02-04 DIAGNOSIS — M6281 Muscle weakness (generalized): Secondary | ICD-10-CM | POA: Diagnosis not present

## 2016-02-04 DIAGNOSIS — M25662 Stiffness of left knee, not elsewhere classified: Secondary | ICD-10-CM | POA: Diagnosis not present

## 2016-02-04 DIAGNOSIS — R262 Difficulty in walking, not elsewhere classified: Secondary | ICD-10-CM | POA: Diagnosis not present

## 2016-02-04 DIAGNOSIS — M25562 Pain in left knee: Secondary | ICD-10-CM | POA: Diagnosis not present

## 2016-02-07 DIAGNOSIS — M1712 Unilateral primary osteoarthritis, left knee: Secondary | ICD-10-CM | POA: Diagnosis not present

## 2016-02-11 DIAGNOSIS — R109 Unspecified abdominal pain: Secondary | ICD-10-CM | POA: Diagnosis not present

## 2016-02-11 DIAGNOSIS — Z6825 Body mass index (BMI) 25.0-25.9, adult: Secondary | ICD-10-CM | POA: Diagnosis not present

## 2016-02-16 DIAGNOSIS — M545 Low back pain: Secondary | ICD-10-CM | POA: Diagnosis not present

## 2016-02-16 DIAGNOSIS — M47816 Spondylosis without myelopathy or radiculopathy, lumbar region: Secondary | ICD-10-CM | POA: Diagnosis not present

## 2016-02-21 DIAGNOSIS — H52223 Regular astigmatism, bilateral: Secondary | ICD-10-CM | POA: Diagnosis not present

## 2016-02-21 DIAGNOSIS — H25813 Combined forms of age-related cataract, bilateral: Secondary | ICD-10-CM | POA: Diagnosis not present

## 2016-02-21 DIAGNOSIS — H40039 Anatomical narrow angle, unspecified eye: Secondary | ICD-10-CM | POA: Diagnosis not present

## 2016-02-21 DIAGNOSIS — H5203 Hypermetropia, bilateral: Secondary | ICD-10-CM | POA: Diagnosis not present

## 2016-03-10 DIAGNOSIS — M4806 Spinal stenosis, lumbar region: Secondary | ICD-10-CM | POA: Diagnosis not present

## 2016-04-03 DIAGNOSIS — M545 Low back pain: Secondary | ICD-10-CM | POA: Diagnosis not present

## 2016-04-03 DIAGNOSIS — Z85828 Personal history of other malignant neoplasm of skin: Secondary | ICD-10-CM | POA: Diagnosis not present

## 2016-04-03 DIAGNOSIS — M5416 Radiculopathy, lumbar region: Secondary | ICD-10-CM | POA: Diagnosis not present

## 2016-04-03 DIAGNOSIS — L821 Other seborrheic keratosis: Secondary | ICD-10-CM | POA: Diagnosis not present

## 2016-04-03 DIAGNOSIS — L570 Actinic keratosis: Secondary | ICD-10-CM | POA: Diagnosis not present

## 2016-05-08 DIAGNOSIS — M47816 Spondylosis without myelopathy or radiculopathy, lumbar region: Secondary | ICD-10-CM | POA: Diagnosis not present

## 2016-05-08 DIAGNOSIS — M1712 Unilateral primary osteoarthritis, left knee: Secondary | ICD-10-CM | POA: Diagnosis not present

## 2016-05-24 ENCOUNTER — Other Ambulatory Visit: Payer: Self-pay | Admitting: Physical Medicine and Rehabilitation

## 2016-05-24 DIAGNOSIS — M545 Low back pain: Secondary | ICD-10-CM

## 2016-05-31 ENCOUNTER — Ambulatory Visit
Admission: RE | Admit: 2016-05-31 | Discharge: 2016-05-31 | Disposition: A | Discharge status: Home / Self Care | Payer: PPO | Source: Ambulatory Visit | Attending: Physical Medicine and Rehabilitation | Admitting: Physical Medicine and Rehabilitation

## 2016-05-31 ENCOUNTER — Other Ambulatory Visit: Payer: PPO

## 2016-05-31 DIAGNOSIS — Z125 Encounter for screening for malignant neoplasm of prostate: Secondary | ICD-10-CM | POA: Diagnosis not present

## 2016-05-31 DIAGNOSIS — E784 Other hyperlipidemia: Secondary | ICD-10-CM | POA: Diagnosis not present

## 2016-05-31 DIAGNOSIS — M4806 Spinal stenosis, lumbar region: Secondary | ICD-10-CM | POA: Diagnosis not present

## 2016-05-31 DIAGNOSIS — R7309 Other abnormal glucose: Secondary | ICD-10-CM | POA: Diagnosis not present

## 2016-05-31 DIAGNOSIS — M545 Low back pain: Secondary | ICD-10-CM

## 2016-06-01 DIAGNOSIS — M5416 Radiculopathy, lumbar region: Secondary | ICD-10-CM | POA: Diagnosis not present

## 2016-06-01 DIAGNOSIS — M47816 Spondylosis without myelopathy or radiculopathy, lumbar region: Secondary | ICD-10-CM | POA: Diagnosis not present

## 2016-06-07 DIAGNOSIS — N401 Enlarged prostate with lower urinary tract symptoms: Secondary | ICD-10-CM | POA: Diagnosis not present

## 2016-06-07 DIAGNOSIS — E784 Other hyperlipidemia: Secondary | ICD-10-CM | POA: Diagnosis not present

## 2016-06-07 DIAGNOSIS — M1990 Unspecified osteoarthritis, unspecified site: Secondary | ICD-10-CM | POA: Diagnosis not present

## 2016-06-07 DIAGNOSIS — Z6825 Body mass index (BMI) 25.0-25.9, adult: Secondary | ICD-10-CM | POA: Diagnosis not present

## 2016-06-07 DIAGNOSIS — Z1389 Encounter for screening for other disorder: Secondary | ICD-10-CM | POA: Diagnosis not present

## 2016-06-07 DIAGNOSIS — R7309 Other abnormal glucose: Secondary | ICD-10-CM | POA: Diagnosis not present

## 2016-06-07 DIAGNOSIS — K219 Gastro-esophageal reflux disease without esophagitis: Secondary | ICD-10-CM | POA: Diagnosis not present

## 2016-06-07 DIAGNOSIS — Z0000 Encounter for general adult medical examination without abnormal findings: Secondary | ICD-10-CM | POA: Diagnosis not present

## 2016-06-07 DIAGNOSIS — R001 Bradycardia, unspecified: Secondary | ICD-10-CM | POA: Diagnosis not present

## 2016-06-07 DIAGNOSIS — Z23 Encounter for immunization: Secondary | ICD-10-CM | POA: Diagnosis not present

## 2016-06-07 DIAGNOSIS — R42 Dizziness and giddiness: Secondary | ICD-10-CM | POA: Diagnosis not present

## 2016-06-07 DIAGNOSIS — R51 Headache: Secondary | ICD-10-CM | POA: Diagnosis not present

## 2016-06-08 DIAGNOSIS — M5416 Radiculopathy, lumbar region: Secondary | ICD-10-CM | POA: Diagnosis not present

## 2016-06-08 DIAGNOSIS — Z1212 Encounter for screening for malignant neoplasm of rectum: Secondary | ICD-10-CM | POA: Diagnosis not present

## 2016-07-05 DIAGNOSIS — L814 Other melanin hyperpigmentation: Secondary | ICD-10-CM | POA: Diagnosis not present

## 2016-07-05 DIAGNOSIS — L821 Other seborrheic keratosis: Secondary | ICD-10-CM | POA: Diagnosis not present

## 2016-07-05 DIAGNOSIS — Z85828 Personal history of other malignant neoplasm of skin: Secondary | ICD-10-CM | POA: Diagnosis not present

## 2016-07-05 DIAGNOSIS — L570 Actinic keratosis: Secondary | ICD-10-CM | POA: Diagnosis not present

## 2016-08-11 DIAGNOSIS — W57XXXA Bitten or stung by nonvenomous insect and other nonvenomous arthropods, initial encounter: Secondary | ICD-10-CM | POA: Diagnosis not present

## 2016-08-11 DIAGNOSIS — Z85828 Personal history of other malignant neoplasm of skin: Secondary | ICD-10-CM | POA: Diagnosis not present

## 2016-09-22 DIAGNOSIS — Z23 Encounter for immunization: Secondary | ICD-10-CM | POA: Diagnosis not present

## 2016-09-22 DIAGNOSIS — K219 Gastro-esophageal reflux disease without esophagitis: Secondary | ICD-10-CM | POA: Diagnosis not present

## 2016-09-22 DIAGNOSIS — Z6824 Body mass index (BMI) 24.0-24.9, adult: Secondary | ICD-10-CM | POA: Diagnosis not present

## 2016-09-22 DIAGNOSIS — R197 Diarrhea, unspecified: Secondary | ICD-10-CM | POA: Diagnosis not present

## 2016-09-25 DIAGNOSIS — R197 Diarrhea, unspecified: Secondary | ICD-10-CM | POA: Diagnosis not present

## 2016-09-26 DIAGNOSIS — R197 Diarrhea, unspecified: Secondary | ICD-10-CM | POA: Diagnosis not present

## 2016-10-04 DIAGNOSIS — L570 Actinic keratosis: Secondary | ICD-10-CM | POA: Diagnosis not present

## 2016-10-04 DIAGNOSIS — Z85828 Personal history of other malignant neoplasm of skin: Secondary | ICD-10-CM | POA: Diagnosis not present

## 2016-10-04 DIAGNOSIS — L821 Other seborrheic keratosis: Secondary | ICD-10-CM | POA: Diagnosis not present

## 2016-12-14 ENCOUNTER — Encounter (INDEPENDENT_AMBULATORY_CARE_PROVIDER_SITE_OTHER): Payer: Self-pay | Admitting: Orthopaedic Surgery

## 2016-12-14 ENCOUNTER — Ambulatory Visit (INDEPENDENT_AMBULATORY_CARE_PROVIDER_SITE_OTHER): Payer: PPO | Admitting: Orthopaedic Surgery

## 2016-12-14 VITALS — BP 171/79 | HR 61 | Ht 70.0 in | Wt 165.0 lb

## 2016-12-14 DIAGNOSIS — M65341 Trigger finger, right ring finger: Secondary | ICD-10-CM | POA: Diagnosis not present

## 2016-12-14 MED ORDER — METHYLPREDNISOLONE ACETATE 40 MG/ML IJ SUSP
13.3300 mg | INTRAMUSCULAR | Status: AC | PRN
  Administered 2016-12-14: 13.33 mg

## 2016-12-14 MED ORDER — LIDOCAINE HCL 1 % IJ SOLN
1.0000 mL | INTRAMUSCULAR | Status: AC | PRN
  Administered 2016-12-14: 1 mL

## 2016-12-14 NOTE — Progress Notes (Signed)
Office Visit Note   Patient: Alan Knight           Date of Birth: 1946-09-02           MRN: [id number] Visit Date: 12/14/2016              Requested by: Reynold Bowen, MD [address] [place], [place] [address] PCP: Sheela Stack, MD   Assessment & Plan: Visit Diagnoses: Right ring trigger finger  Plan: We will inject the area of the A1 pulley with cortisone, instructed on occasional splinting and see back on as needed basis Follow-Up Instructions: No Follow-up on file.   Orders:  No orders of the defined types were placed in this encounter.  No orders of the defined types were placed in this encounter.     Procedures: Hand/UE Inj Date/Time: 12/14/2016 12:16 PM Performed by: Garald Balding Authorized by: Garald Balding   Consent Given by:  Patient Indications:  Pain Condition: trigger finger   Location:  Ring finger Site:  R ring A1 Needle Size:  27 G Approach:  Volar Ultrasound Guidance: No   Medications:  1 mL lidocaine 1 %; 13.33 mg methylPREDNISolone acetate 40 MG/ML     Clinical Data: No additional findings.   Subjective: Chief Complaint  Patient presents with  . Right Ring Finger - Pain    Pt having a little pain in his right ring finger. He did have a "drop" finger but there is some stiffness and soreness there   Ti has been experiencing some active triggering of the right ring finger without history of injury or trauma. Review of Systems   Objective: Vital Signs: There were no vitals taken for this visit.  Physical Exam  Ortho Exam right hand exam reveals mildly painful nodule on the palmar aspect of the right ring finger near the metacarpal phalangeal joint. There are some cords in the palm of his hand consistent with early Dupuytren's but without contracture. Neurovascular exam is intact. There is no swelling. He could actively trigger the finger.  Specialty Comments:  No specialty comments available.  Imaging: No  results found.   PMFS History: Patient Active Problem List   Diagnosis Date Noted  . Primary osteoarthritis of left knee 11/09/2015  . S/P total knee replacement using cement 11/09/2015   Past Medical History:  Diagnosis Date  . Arthritis   . Basal cell carcinoma   . BPH (benign prostatic hypertrophy) with urinary obstruction   . Diverticulosis   . Hyperlipidemia   . Hypertension   . Hypogonadism male   . Internal hemorrhoids   . OA (osteoarthritis)     Family History  Problem Relation Age of Onset  . Heart attack Father   . CVA Brother     Past Surgical History:  Procedure Laterality Date  . CARPAL TUNNEL RELEASE     left  . HIP ARTHROPLASTY    . JOINT REPLACEMENT    . KNEE ARTHROPLASTY     right  . KNEE SURGERY    . SHOULDER SURGERY     bilateral  . TOTAL KNEE ARTHROPLASTY     left  . TOTAL KNEE ARTHROPLASTY Left 11/09/2015   Procedure: TOTAL KNEE ARTHROPLASTY;  Surgeon: Garald Balding, MD;  Location: [facility];  Service: Orthopedics;  Laterality: Left;   Social History   Occupational History  . Sales    Social History Main Topics  . Smoking status: Never Smoker  . Smokeless tobacco: Not on file  .  Alcohol use Yes     Comment: 1 mixed drink per day  . Drug use: No  . Sexual activity: Not on file

## 2017-01-23 DIAGNOSIS — R05 Cough: Secondary | ICD-10-CM | POA: Diagnosis not present

## 2017-02-14 DIAGNOSIS — H52223 Regular astigmatism, bilateral: Secondary | ICD-10-CM | POA: Diagnosis not present

## 2017-02-14 DIAGNOSIS — H25813 Combined forms of age-related cataract, bilateral: Secondary | ICD-10-CM | POA: Diagnosis not present

## 2017-02-14 DIAGNOSIS — H40033 Anatomical narrow angle, bilateral: Secondary | ICD-10-CM | POA: Diagnosis not present

## 2017-02-14 DIAGNOSIS — H5203 Hypermetropia, bilateral: Secondary | ICD-10-CM | POA: Diagnosis not present

## 2017-02-21 DIAGNOSIS — R05 Cough: Secondary | ICD-10-CM | POA: Diagnosis not present

## 2017-02-21 DIAGNOSIS — M1990 Unspecified osteoarthritis, unspecified site: Secondary | ICD-10-CM | POA: Insufficient documentation

## 2017-02-21 DIAGNOSIS — R0789 Other chest pain: Secondary | ICD-10-CM

## 2017-02-21 DIAGNOSIS — R059 Cough, unspecified: Secondary | ICD-10-CM

## 2017-02-21 HISTORY — DX: Other chest pain: R07.89

## 2017-02-21 HISTORY — DX: Unspecified osteoarthritis, unspecified site: M19.90

## 2017-02-21 HISTORY — DX: Cough, unspecified: R05.9

## 2017-03-12 ENCOUNTER — Institutional Professional Consult (permissible substitution): Payer: PPO | Admitting: Pulmonary Disease

## 2017-03-14 DIAGNOSIS — L853 Xerosis cutis: Secondary | ICD-10-CM | POA: Diagnosis not present

## 2017-03-14 DIAGNOSIS — D485 Neoplasm of uncertain behavior of skin: Secondary | ICD-10-CM | POA: Diagnosis not present

## 2017-03-14 DIAGNOSIS — Z85828 Personal history of other malignant neoplasm of skin: Secondary | ICD-10-CM | POA: Diagnosis not present

## 2017-03-14 DIAGNOSIS — L821 Other seborrheic keratosis: Secondary | ICD-10-CM | POA: Diagnosis not present

## 2017-03-14 DIAGNOSIS — L570 Actinic keratosis: Secondary | ICD-10-CM | POA: Diagnosis not present

## 2017-03-14 DIAGNOSIS — L98499 Non-pressure chronic ulcer of skin of other sites with unspecified severity: Secondary | ICD-10-CM | POA: Diagnosis not present

## 2017-05-14 ENCOUNTER — Telehealth (INDEPENDENT_AMBULATORY_CARE_PROVIDER_SITE_OTHER): Payer: Self-pay | Admitting: Physical Medicine and Rehabilitation

## 2017-05-14 ENCOUNTER — Encounter (INDEPENDENT_AMBULATORY_CARE_PROVIDER_SITE_OTHER): Payer: Self-pay | Admitting: Orthopaedic Surgery

## 2017-05-14 ENCOUNTER — Encounter (INDEPENDENT_AMBULATORY_CARE_PROVIDER_SITE_OTHER): Payer: Self-pay

## 2017-05-14 ENCOUNTER — Ambulatory Visit (INDEPENDENT_AMBULATORY_CARE_PROVIDER_SITE_OTHER): Payer: PPO | Admitting: Orthopaedic Surgery

## 2017-05-14 VITALS — BP 132/77 | HR 51 | Ht 70.0 in | Wt 162.0 lb

## 2017-05-14 DIAGNOSIS — Z96652 Presence of left artificial knee joint: Secondary | ICD-10-CM | POA: Diagnosis not present

## 2017-05-14 DIAGNOSIS — Z96651 Presence of right artificial knee joint: Secondary | ICD-10-CM | POA: Diagnosis not present

## 2017-05-14 MED ORDER — AMOXICILLIN 500 MG PO TABS: 500.0000 mg | ORAL_TABLET | Freq: Once | ORAL | 0 refills | Status: DC | PRN

## 2017-05-14 NOTE — Telephone Encounter (Signed)
Called patient and he is having pain on both sides as well as in the center. Please advise- will need prior auth.

## 2017-05-14 NOTE — Telephone Encounter (Signed)
[place] if no new issues etc

## 2017-05-14 NOTE — Progress Notes (Signed)
Office Visit Note   Patient: Alan Knight           Date of Birth: May 10, 1946           MRN: [id number] Visit Date: 05/14/2017              Requested by: Reynold Bowen, [place], [place] [address] PCP: Reynold Bowen, MD   Assessment & Plan: Visit Diagnoses:  1. History of left knee replacement   2. History of total right knee replacement   1.5 years status post primary left total knee replacement and doing quite well several years status post right knee replacement. Axle is playing golf twice a week and notes he doesn't have  limitations with either knee  Plan: Follow up as needed   Follow-Up Instructions: Return if symptoms worsen or fail to improve.   Orders:  No orders of the defined types were placed in this encounter.  No orders of the defined types were placed in this encounter.     Procedures: No procedures performed   Clinical Data: No additional findings.   Subjective: Chief Complaint  Patient presents with  . Left Foot - Routine Post Op    Alan Knight is a 71 y o  Status post L knee replacement 11/09/15. No problems.  Several years status post primary right total knee replacement. He notes that he is doing well without any limitations. He works out at [employer] and plays golf at least twice a week without any problems. He denies fever or chills.  HPI  Review of Systems   Objective: Vital Signs: BP 132/77   Pulse (!) 51   Ht 5\' 10"  (1.778 m)   Wt 162 lb (73.5 kg)   BMI 23.24 kg/m   Physical Exam  Ortho Exam right knee with full extension and approximately 106 of flexion with a goniometer. No effusion. No instability. No calf pain. Neurovascular exam intact. Left knee with full quick extension 103 of flexion without instability. No effusion. No areas of joint tenderness. No erythema or ecchymosis. Neurovascular exam intact.  Specialty Comments:  No specialty comments available.  Imaging: No results found.   PMFS  History: Patient Active Problem List   Diagnosis Date Noted  . Primary osteoarthritis of left knee 11/09/2015  . S/P total knee replacement using cement 11/09/2015   Past Medical History:  Diagnosis Date  . Arthritis   . Basal cell carcinoma   . BPH (benign prostatic hypertrophy) with urinary obstruction   . Diverticulosis   . Hyperlipidemia   . Hypertension   . Hypogonadism male   . Internal hemorrhoids   . OA (osteoarthritis)     Family History  Problem Relation Age of Onset  . Heart attack Father   . CVA Brother     Past Surgical History:  Procedure Laterality Date  . CARPAL TUNNEL RELEASE     left  . HIP ARTHROPLASTY    . JOINT REPLACEMENT    . KNEE ARTHROPLASTY     right  . KNEE SURGERY    . SHOULDER SURGERY     bilateral  . TOTAL KNEE ARTHROPLASTY     left  . TOTAL KNEE ARTHROPLASTY Left 11/09/2015   Procedure: TOTAL KNEE ARTHROPLASTY;  Surgeon: Garald Balding, MD;  Location: [facility];  Service: Orthopedics;  Laterality: Left;   Social History   Occupational History  . Sales    Social History Main Topics  . Smoking status: Never Smoker  .  Smokeless tobacco: Never Used  . Alcohol use Yes     Comment: 1 mixed drink per day  . Drug use: No  . Sexual activity: Not on file     Garald Balding, MD   Note - This record has been created using [employer].  Chart creation errors have been sought, but may not always  have been located. Such creation errors do not reflect on  the standard of medical care.

## 2017-05-14 NOTE — Telephone Encounter (Signed)
Scheduled for OV 5/24 at 0815.

## 2017-05-14 NOTE — Telephone Encounter (Signed)
He has moderate stenosis at L4-5 and right lateral stenosis at 3-4 with some scoliosis, if both sides now and Health Team then need OV, know him from past should not be long visit, tell him needed to get pre-auth

## 2017-05-17 ENCOUNTER — Telehealth (INDEPENDENT_AMBULATORY_CARE_PROVIDER_SITE_OTHER): Payer: Self-pay | Admitting: Physical Medicine and Rehabilitation

## 2017-05-17 ENCOUNTER — Ambulatory Visit (INDEPENDENT_AMBULATORY_CARE_PROVIDER_SITE_OTHER): Payer: PPO | Admitting: Physical Medicine and Rehabilitation

## 2017-05-17 ENCOUNTER — Encounter (INDEPENDENT_AMBULATORY_CARE_PROVIDER_SITE_OTHER): Payer: Self-pay | Admitting: Physical Medicine and Rehabilitation

## 2017-05-17 VITALS — BP 143/72 | HR 47

## 2017-05-17 DIAGNOSIS — M545 Low back pain, unspecified: Secondary | ICD-10-CM

## 2017-05-17 DIAGNOSIS — M419 Scoliosis, unspecified: Secondary | ICD-10-CM

## 2017-05-17 DIAGNOSIS — G8929 Other chronic pain: Secondary | ICD-10-CM | POA: Diagnosis not present

## 2017-05-17 DIAGNOSIS — M47816 Spondylosis without myelopathy or radiculopathy, lumbar region: Secondary | ICD-10-CM | POA: Diagnosis not present

## 2017-05-17 HISTORY — DX: Low back pain, unspecified: M54.50

## 2017-05-17 HISTORY — DX: Spondylosis without myelopathy or radiculopathy, lumbar region: M47.816

## 2017-05-17 HISTORY — DX: Other chronic pain: G89.29

## 2017-05-17 NOTE — Progress Notes (Signed)
Alan Knight - 71 y.o. male MRN [id number]  Date of birth: 1946/01/18  Office Visit Note: Visit Date: 05/17/2017 PCP: Reynold Bowen, MD Referred by: Reynold Bowen, MD  Subjective: Chief Complaint  Patient presents with  . Lower Back - Pain   HPI: Alan Knight is a very young appearing 71 year old gentleman that we know quite well. We've seen him over the last several years for intermittent exacerbation of chronic low back pain. His pain traditionally has been more right sided low back and buttock pain. The last time I saw him we completed a right L4 transforaminal injection with pretty good relief. We had updated his MRI which is reviewed below. He does have degenerative spine throughout with at least moderate bilateral facet arthropathy at L3-4 and L4-5 and L5-S1. He has somewhat of a mild scoliosis to the right with some foraminal narrowing at L4-5 and L5-S1. He has no focal disc herniations or focal central canal stenosis. He comes in today with several month history of worsening axial low back pain which is bilateral. It is worse with standing and twisting and ambulating. He has no radicular pain down the legs. No focal weakness. No specific trauma. He says it is worsened with increasing activity and doing yardwork. He is a very physically active guy who still works out and does weightlifting and other type of physical activity. He has a history of bilateral total knee replacements as well as right total hip replacement.   He denies any fevers chills or night sweats. No unexplained weight loss. No real red flag symptoms. He reports severe pain in the low back that really does limit what he can do right now. He has tried anti-inflammatories a tried some activity modification and time it is just no better at this point. In the past he has had physical therapy continues with exercises. He reports very occasional left buttock pain but nothing that bothers him that much.    Review of Systems    Constitutional: Negative for chills, fever, malaise/fatigue and weight loss.  HENT: Negative for hearing loss and sinus pain.   Eyes: Negative for blurred vision, double vision and photophobia.  Respiratory: Negative for cough and shortness of breath.   Cardiovascular: Negative for chest pain, palpitations and leg swelling.  Gastrointestinal: Negative for abdominal pain, nausea and vomiting.  Genitourinary: Negative for flank pain.  Musculoskeletal: Positive for back pain. Negative for myalgias.  Skin: Negative for itching and rash.  Neurological: Negative for tremors, focal weakness and weakness.  Endo/Heme/Allergies: Negative.   Psychiatric/Behavioral: Negative for depression.  All other systems reviewed and are negative.  Otherwise per HPI.  Assessment & Plan: Visit Diagnoses:  1. Spondylosis without myelopathy or radiculopathy, lumbar region   2. Chronic bilateral low back pain without sciatica   3. Scoliosis of lumbar spine, unspecified scoliosis type     Plan: Findings:  Chronic worsening severe at times axial low back pain worse over the last several months and just progressively worsening. This has not responded to anti-inflammatories, activity modification and continued core strengthening on his own. This seems to be facet joint mediated pain at least on exam and clinical scenario. MRI evidence of facet arthropathy. I think the best approach is diagnostic bilateral L4-5 and L5-S1 facet joint blocks. If he does well with those we'll just watch him and tried to help him with different exercises that are exacerbating his symptoms. We discussed the fact that he continues to do full sit-ups at times even over the  years we have discussed not doing these I think that may be exacerbating his pain as well. We also talked about radiofrequency ablation which she would be a good candidate for some point depending on how he does with these injections. I do not feel the need for any head imaging  today based on the exam and his story. He should continue with current medications at least at this point. We will seek expedite approval because of level of pain that he is having.    Meds & Orders: No orders of the defined types were placed in this encounter.  No orders of the defined types were placed in this encounter.   Follow-up: Return for Bilateral L4-5 and L5-S1 facet joint blocks.   Procedures: No procedures performed  No notes on file   Clinical History: Lumbar spine MRI 05/31/2016  Disc levels:  Disc spaces: Degenerative disc disease with disc height loss throughout the lumbar spine.  T11-12: Minimal broad-based disc bulge. Bilateral facet arthropathy. No spinal or foraminal stenosis.  T12-L1: Broad-based disc bulge eccentric towards the left. Moderate left and mild right facet arthropathy. Moderate left foraminal stenosis. No central canal stenosis.  L1-L2: Broad-based disc bulge with a left lateral disc osteophyte complex. Mild bilateral facet arthropathy. Left lateral recess narrowing. Mild bilateral foraminal narrowing. No central canal stenosis.  L2-L3: Mild broad-based disc bulge. Mild bilateral facet arthropathy. No evidence of neural foraminal stenosis. No central canal stenosis.  L3-L4: Broad-based disc bulge with a a right lateral disc osteophyte complex. Moderate bilateral facet arthropathy. Right lateral recess stenosis. Moderate -severe foraminal stenosis. Mild spinal stenosis.  L4-L5: Broad-based disc bulge. Moderate bilateral facet arthropathy, right worse than left with ligamentum flavum infolding. Moderate spinal stenosis. Severe right and mild-moderate left foraminal stenosis.  L5-S1: Mild broad-based disc bulge. Moderate bilateral facet arthropathy. Moderate right and mild left foraminal stenosis. No central canal stenosis.  IMPRESSION: 1. Diffuse degenerative disc disease and facet arthropathy as described above which is  similar in appearance to the prior examination of 01/22/2014. 2. At L4-5 there is a broad-based disc bulge. Moderate bilateral facet arthropathy, right worse than left with ligamentum flavum infolding. Moderate spinal stenosis. Severe right and mild-moderate left foraminal stenosis.  He reports that he has never smoked. He has never used smokeless tobacco. No results for input(s): HGBA1C, LABURIC in the last 8760 hours.  Objective:  VS:  HT:    WT:   BMI:     BP:(!) 143/72  HR:(!) 47bpm  TEMP: ( )  RESP:  Physical Exam  Constitutional: He is oriented to person, place, and time. He appears well-developed and well-nourished. No distress.  HENT:  Head: Normocephalic and atraumatic.  Eyes: Conjunctivae are normal. Pupils are equal, round, and reactive to light.  Neck: Normal range of motion. Neck supple.  Cardiovascular: Regular rhythm and intact distal pulses.   Pulmonary/Chest: Effort normal. No respiratory distress.  Musculoskeletal:  Patient ambulates with somewhat of a forward flexed spine. He is very stiff to the lumbar spine but does have pain with extension. He has no pain over the greater trochanters. He has no pain with hip rotation is very fluid and movement with his hips. He has good distal strength without clonus bilaterally. He has no active trigger points noted in the paraspinal region.  Neurological: He is alert and oriented to person, place, and time. He exhibits normal muscle tone. Coordination normal.  Skin: Skin is warm and dry. No rash noted. No erythema.  Psychiatric: He has  a normal mood and affect.  Nursing note and vitals reviewed.   Ortho Exam Imaging: No results found.  Past Medical/Family/Surgical/Social History: Medications & Allergies reviewed per EMR Patient Active Problem List   Diagnosis Date Noted  . Spondylosis without myelopathy or radiculopathy, lumbar region 05/17/2017  . Chronic bilateral low back pain without sciatica 05/17/2017  . Primary  osteoarthritis of left knee 11/09/2015  . S/P total knee replacement using cement 11/09/2015   Past Medical History:  Diagnosis Date  . Arthritis   . Basal cell carcinoma   . BPH (benign prostatic hypertrophy) with urinary obstruction   . Diverticulosis   . Hyperlipidemia   . Hypertension   . Hypogonadism male   . Internal hemorrhoids   . OA (osteoarthritis)    Family History  Problem Relation Age of Onset  . Heart attack Father   . CVA Brother    Past Surgical History:  Procedure Laterality Date  . CARPAL TUNNEL RELEASE     left  . HIP ARTHROPLASTY    . JOINT REPLACEMENT    . KNEE ARTHROPLASTY     right  . KNEE SURGERY    . SHOULDER SURGERY     bilateral  . TOTAL KNEE ARTHROPLASTY     left  . TOTAL KNEE ARTHROPLASTY Left 11/09/2015   Procedure: TOTAL KNEE ARTHROPLASTY;  Surgeon: Garald Balding, MD;  Location: [facility];  Service: Orthopedics;  Laterality: Left;   Social History   Occupational History  . Sales    Social History Main Topics  . Smoking status: Never Smoker  . Smokeless tobacco: Never Used  . Alcohol use Yes     Comment: 1 mixed drink per day  . Drug use: No  . Sexual activity: Not on file

## 2017-05-17 NOTE — Telephone Encounter (Signed)
Faxed auth form with office note to [facility] [phone] Marked it expedited.

## 2017-05-17 NOTE — Progress Notes (Deleted)
Lower back pain. History of injections which worked well. Pain is across the lower back Some left buttock pain, but not bad. No numbness or tingling and no leg pain. Pain comes and goes. Pain increases after working in the yard or going to the gym.

## 2017-05-18 NOTE — Telephone Encounter (Signed)
Received Josem Kaufmann, [id number]. Eff 05/17/17-08/15/17. Scheduled pt for 05/23/17 @ 3:00 w/driver.

## 2017-05-23 ENCOUNTER — Ambulatory Visit (INDEPENDENT_AMBULATORY_CARE_PROVIDER_SITE_OTHER): Payer: Self-pay

## 2017-05-23 ENCOUNTER — Ambulatory Visit (INDEPENDENT_AMBULATORY_CARE_PROVIDER_SITE_OTHER): Payer: PPO | Admitting: Physical Medicine and Rehabilitation

## 2017-05-23 ENCOUNTER — Encounter (INDEPENDENT_AMBULATORY_CARE_PROVIDER_SITE_OTHER): Payer: Self-pay | Admitting: Physical Medicine and Rehabilitation

## 2017-05-23 VITALS — BP 144/70 | HR 53

## 2017-05-23 DIAGNOSIS — M47816 Spondylosis without myelopathy or radiculopathy, lumbar region: Secondary | ICD-10-CM | POA: Diagnosis not present

## 2017-05-23 DIAGNOSIS — M545 Low back pain, unspecified: Secondary | ICD-10-CM

## 2017-05-23 DIAGNOSIS — G8929 Other chronic pain: Secondary | ICD-10-CM

## 2017-05-23 MED ORDER — METHYLPREDNISOLONE ACETATE 80 MG/ML IJ SUSP
80.0000 mg | Freq: Once | INTRAMUSCULAR | Status: AC
  Administered 2017-05-23: 80 mg

## 2017-05-23 MED ORDER — LIDOCAINE HCL (PF) 1 % IJ SOLN
2.0000 mL | Freq: Once | INTRAMUSCULAR | Status: AC
  Administered 2017-05-23: 2 mL

## 2017-05-23 NOTE — Procedures (Signed)
Lumbar Facet Joint Intra-Articular Injection(s) with Fluoroscopic Guidance  Patient: Alan Knight      Date of Birth: 1946-02-03 MRN: [id number] PCP: Reynold Bowen, MD      Visit Date: 05/23/2017   Mr. Smithson is a young-appearing 71 year old gentleman recently saw for evaluation and management. Please see our prior evaluation and management note for further details and justification. He's had no change in symptoms since that time and his symptoms are axial low back pain and bilateral left equal to right worse with standing extension and movement.  Universal Protocol:    Date/Time: 05/30/183:28 PM  Consent Given By: the patient  Position: PRONE   Additional Comments: Vital signs were monitored before and after the procedure. Patient was prepped and draped in the usual sterile fashion. The correct patient, procedure, and site was verified.   Injection Procedure Details:  Procedure Site One Meds Administered:  Meds ordered this encounter  Medications  . lidocaine (PF) (XYLOCAINE) 1 % injection 2 mL  . methylPREDNISolone acetate (DEPO-MEDROL) injection 80 mg     Laterality: Bilateral  Location/Site:  L4-L5 L5-S1  Needle size: 22 guage  Needle type: Spinal  Needle Placement: Articular  Findings:  -Contrast Used: 1 mL iohexol 180 mg iodine/mL   -Comments: Excellent flow of contrast producing a partial arthrogram.  Procedure Details: The fluoroscope beam is vertically oriented in AP, and the inferior recess is visualized beneath the lower pole of the inferior apophyseal process, which represents the target point for needle insertion. When direct visualization is difficult the target point is located at the medial projection of the vertebral pedicle. The region overlying each aforementioned target is locally anesthetized with a 1 to 2 ml. volume of 1% Lidocaine without Epinephrine.   The spinal needle was inserted into each of the above mentioned facet joints using biplanar  fluoroscopic guidance. A 0.25 to 0.5 ml. volume of Isovue-250 was injected and a partial facet joint arthrogram was obtained. A single spot film was obtained of the resulting arthrogram.    One to 1.25 ml of the steroid/anesthetic solution was then injected into each of the facet joints noted above.   Additional Comments:  The patient tolerated the procedure well No complications occurred Dressing: Band-Aid    Post-procedure details: Patient was observed during the procedure. Post-procedure instructions were reviewed.  Patient left the clinic in stable condition.

## 2017-05-23 NOTE — Progress Notes (Deleted)
Patient is here today for planned bilateral L4-5 and  L5-S1 facet injection. No change in symptoms.

## 2017-05-23 NOTE — Patient Instructions (Signed)

## 2017-06-06 DIAGNOSIS — E784 Other hyperlipidemia: Secondary | ICD-10-CM | POA: Diagnosis not present

## 2017-06-06 DIAGNOSIS — R7309 Other abnormal glucose: Secondary | ICD-10-CM | POA: Diagnosis not present

## 2017-06-06 DIAGNOSIS — Z125 Encounter for screening for malignant neoplasm of prostate: Secondary | ICD-10-CM | POA: Diagnosis not present

## 2017-06-06 DIAGNOSIS — Z0000 Encounter for general adult medical examination without abnormal findings: Secondary | ICD-10-CM | POA: Diagnosis not present

## 2017-06-07 DIAGNOSIS — G43809 Other migraine, not intractable, without status migrainosus: Secondary | ICD-10-CM | POA: Diagnosis not present

## 2017-06-07 DIAGNOSIS — Z049 Encounter for examination and observation for unspecified reason: Secondary | ICD-10-CM | POA: Diagnosis not present

## 2017-06-07 DIAGNOSIS — Z79899 Other long term (current) drug therapy: Secondary | ICD-10-CM | POA: Diagnosis not present

## 2017-06-11 DIAGNOSIS — R51 Headache: Secondary | ICD-10-CM | POA: Diagnosis not present

## 2017-06-11 DIAGNOSIS — Z1212 Encounter for screening for malignant neoplasm of rectum: Secondary | ICD-10-CM | POA: Diagnosis not present

## 2017-06-11 DIAGNOSIS — M791 Myalgia: Secondary | ICD-10-CM | POA: Diagnosis not present

## 2017-06-11 DIAGNOSIS — M542 Cervicalgia: Secondary | ICD-10-CM | POA: Diagnosis not present

## 2017-06-11 DIAGNOSIS — G43809 Other migraine, not intractable, without status migrainosus: Secondary | ICD-10-CM | POA: Diagnosis not present

## 2017-06-11 DIAGNOSIS — G518 Other disorders of facial nerve: Secondary | ICD-10-CM | POA: Diagnosis not present

## 2017-06-13 DIAGNOSIS — M545 Low back pain: Secondary | ICD-10-CM | POA: Diagnosis not present

## 2017-06-13 DIAGNOSIS — Z0000 Encounter for general adult medical examination without abnormal findings: Secondary | ICD-10-CM | POA: Diagnosis not present

## 2017-06-13 DIAGNOSIS — E784 Other hyperlipidemia: Secondary | ICD-10-CM | POA: Diagnosis not present

## 2017-06-13 DIAGNOSIS — G43909 Migraine, unspecified, not intractable, without status migrainosus: Secondary | ICD-10-CM | POA: Diagnosis not present

## 2017-06-13 DIAGNOSIS — I480 Paroxysmal atrial fibrillation: Secondary | ICD-10-CM | POA: Diagnosis not present

## 2017-06-13 DIAGNOSIS — R7309 Other abnormal glucose: Secondary | ICD-10-CM | POA: Diagnosis not present

## 2017-06-13 DIAGNOSIS — R001 Bradycardia, unspecified: Secondary | ICD-10-CM | POA: Diagnosis not present

## 2017-06-13 DIAGNOSIS — Z6826 Body mass index (BMI) 26.0-26.9, adult: Secondary | ICD-10-CM | POA: Diagnosis not present

## 2017-06-13 DIAGNOSIS — N401 Enlarged prostate with lower urinary tract symptoms: Secondary | ICD-10-CM | POA: Diagnosis not present

## 2017-06-13 DIAGNOSIS — R0600 Dyspnea, unspecified: Secondary | ICD-10-CM | POA: Diagnosis not present

## 2017-06-13 DIAGNOSIS — Z1389 Encounter for screening for other disorder: Secondary | ICD-10-CM | POA: Diagnosis not present

## 2017-06-13 DIAGNOSIS — K649 Unspecified hemorrhoids: Secondary | ICD-10-CM | POA: Diagnosis not present

## 2017-06-14 ENCOUNTER — Encounter (HOSPITAL_COMMUNITY): Payer: Self-pay | Admitting: Nurse Practitioner

## 2017-06-14 ENCOUNTER — Ambulatory Visit (HOSPITAL_COMMUNITY)
Admission: RE | Admit: 2017-06-14 | Discharge: 2017-06-14 | Disposition: A | Discharge status: Home / Self Care | Payer: PPO | Source: Ambulatory Visit | Attending: Nurse Practitioner | Admitting: Nurse Practitioner

## 2017-06-14 VITALS — BP 108/74 | HR 57 | Ht 70.0 in | Wt 159.6 lb

## 2017-06-14 DIAGNOSIS — K648 Other hemorrhoids: Secondary | ICD-10-CM | POA: Insufficient documentation

## 2017-06-14 DIAGNOSIS — Z8249 Family history of ischemic heart disease and other diseases of the circulatory system: Secondary | ICD-10-CM | POA: Diagnosis not present

## 2017-06-14 DIAGNOSIS — Z823 Family history of stroke: Secondary | ICD-10-CM | POA: Insufficient documentation

## 2017-06-14 DIAGNOSIS — R001 Bradycardia, unspecified: Secondary | ICD-10-CM | POA: Insufficient documentation

## 2017-06-14 DIAGNOSIS — E785 Hyperlipidemia, unspecified: Secondary | ICD-10-CM | POA: Diagnosis not present

## 2017-06-14 DIAGNOSIS — Z85828 Personal history of other malignant neoplasm of skin: Secondary | ICD-10-CM | POA: Diagnosis not present

## 2017-06-14 DIAGNOSIS — I10 Essential (primary) hypertension: Secondary | ICD-10-CM | POA: Diagnosis not present

## 2017-06-14 DIAGNOSIS — Z7901 Long term (current) use of anticoagulants: Secondary | ICD-10-CM | POA: Insufficient documentation

## 2017-06-14 DIAGNOSIS — Z9889 Other specified postprocedural states: Secondary | ICD-10-CM | POA: Insufficient documentation

## 2017-06-14 DIAGNOSIS — M1990 Unspecified osteoarthritis, unspecified site: Secondary | ICD-10-CM | POA: Insufficient documentation

## 2017-06-14 DIAGNOSIS — Z96652 Presence of left artificial knee joint: Secondary | ICD-10-CM | POA: Insufficient documentation

## 2017-06-14 DIAGNOSIS — I480 Paroxysmal atrial fibrillation: Secondary | ICD-10-CM

## 2017-06-14 DIAGNOSIS — Z79899 Other long term (current) drug therapy: Secondary | ICD-10-CM | POA: Diagnosis not present

## 2017-06-14 NOTE — Progress Notes (Addendum)
Primary Care Physician: Reynold Bowen, MD Referring Physician: Same   Alan Knight is a 71 y.o. male with a h/o HTN, and newly diagnosed afib that was seen at time of physical yesterday with Dr. Forde Dandy. He was asymptomatic and rate was controlled at 77 bpm,  with h/o bradycardia. He is in the afib clinic for further evaluation. EKG today shows Sinus brady at 57 bpm. He can tell no difference today from yesterday as far as he feels.He is active and denies any significant snoring history. No excessive caffeine. Usually drinks alcohol x1 drink nightly. Plays golf 3x a week and goes to the gym regularly. Was started on eliquis 5 mg bid for a CHA2DS2VASc score of 2(age,HTN).  Today, he denies symptoms of palpitations, chest pain, shortness of breath, orthopnea, PND, lower extremity edema, dizziness, presyncope, syncope, or neurologic sequela. The patient is tolerating medications without difficulties and is otherwise without complaint today.   Past Medical History:  Diagnosis Date  . Arthritis   . Basal cell carcinoma   . BPH (benign prostatic hypertrophy) with urinary obstruction   . Diverticulosis   . Hyperlipidemia   . Hypertension   . Hypogonadism male   . Internal hemorrhoids   . OA (osteoarthritis)    Past Surgical History:  Procedure Laterality Date  . CARPAL TUNNEL RELEASE     left  . HIP ARTHROPLASTY    . JOINT REPLACEMENT    . KNEE ARTHROPLASTY     right  . KNEE SURGERY    . SHOULDER SURGERY     bilateral  . TOTAL KNEE ARTHROPLASTY     left  . TOTAL KNEE ARTHROPLASTY Left 11/09/2015   Procedure: TOTAL KNEE ARTHROPLASTY;  Surgeon: Garald Balding, MD;  Location: [facility];  Service: Orthopedics;  Laterality: Left;    Current Outpatient Prescriptions  Medication Sig Dispense Refill  . apixaban (ELIQUIS) 5 MG TABS tablet Take 5 mg by mouth 2 (two) times daily.    . Ascorbic Acid (VITAMIN C) 1000 MG tablet Take 1,000 mg by mouth daily.    . Calcium Citrate-Vitamin D  (CALCIUM CITRATE +D PO) Take 1 tablet by mouth daily. With Magnesium and Zinc    . Cholecalciferol (VITAMIN D3) 5000 UNITS CAPS Take 1 capsule by mouth daily.    . fluticasone (FLONASE) 50 MCG/ACT nasal spray Place 2 sprays into the nose daily as needed for allergies.     Marland Kitchen lisinopril (PRINIVIL,ZESTRIL) 5 MG tablet Take 5 mg by mouth daily.    . naproxen (NAPROSYN) 500 MG tablet     . simvastatin (ZOCOR) 40 MG tablet Take 40 mg by mouth daily.    Marland Kitchen zonisamide (ZONEGRAN) 25 MG capsule 1 capsule.  1  . amoxicillin (AMOXIL) 500 MG tablet Take 1 tablet (500 mg total) by mouth once as needed. Take one capsule one hour prior to any dental procedure. (Patient not taking: Reported on 06/14/2017) 21 tablet 0   No current facility-administered medications for this encounter.     No Known Allergies  Social History   Social History  . Marital status: Married    Spouse name: N/A  . Number of children: N/A  . Years of education: N/A   Occupational History  . Sales    Social History Main Topics  . Smoking status: Never Smoker  . Smokeless tobacco: Never Used  . Alcohol use Yes     Comment: 1 mixed drink per day  . Drug use: No  . Sexual activity: Not  on file   Other Topics Concern  . Not on file   Social History Narrative  . No narrative on file    Family History  Problem Relation Age of Onset  . Heart attack Father   . CVA Brother     ROS- All systems are reviewed and negative except as per the HPI above  Physical Exam: Vitals:   06/14/17 1129  BP: 108/74  Pulse: (!) 57  Weight: 159 lb 9.6 oz (72.4 kg)  Height: 5\' 10"  (1.778 m)   Wt Readings from Last 3 Encounters:  06/14/17 159 lb 9.6 oz (72.4 kg)  05/14/17 162 lb (73.5 kg)  12/14/16 165 lb (74.8 kg)    Labs: Lab Results  Component Value Date   NA 134 (L) 11/11/2015   K 4.2 11/11/2015   CL 100 (L) 11/11/2015   CO2 29 11/11/2015   GLUCOSE 116 (H) 11/11/2015   BUN 11 11/11/2015   CREATININE 0.68 11/11/2015    CALCIUM 8.7 (L) 11/11/2015   Lab Results  Component Value Date   INR 1.01 10/28/2015   No results found for: CHOL, HDL, LDLCALC, TRIG   GEN- The patient is well appearing, alert and oriented x 3 today.   Head- normocephalic, atraumatic Eyes-  Sclera clear, conjunctiva pink Ears- hearing intact Oropharynx- clear Neck- supple, no JVP Lymph- no cervical lymphadenopathy Lungs- Clear to ausculation bilaterally, normal work of breathing Heart- Regular rate and rhythm, no murmurs, rubs or gallops, PMI not laterally displaced GI- soft, NT, ND, + BS Extremities- no clubbing, cyanosis, or edema MS- no significant deformity or atrophy Skin- no rash or lesion Psych- euthymic mood, full affect Neuro- strength and sensation are intact  EKG-sinus brady at 57 bpm, Pr int 130 ms, qrs int 86 nms, qtc 406 ms Office records reviewed Echo-2016- basically normal echo Recent labs reviewed- unremarkable cmet/cbc/chol, creatinine 0.9, will request last TSH    Assessment and Plan: 1. New onset paroxysmal  afib, asymptomatic Today he is in [facility] General education re afib Will not start rate control for brady at baseline and rate controlled in afib Continue eliquis for a CHA2DS2VASc score of 2(age/htn) Bleeding precautions discussed Stop asa and try to minimize or stop naprosyn Discussed an event monitor to determine afib burden but pt does not want to wear x one month They will look into [place] [place] phone app/device to track afib F/u in one month with CBC, sooner if any issues re heart rhythm, anticoagulation   Addendum- TSH drawn 6/20 received form PCP office is WNL at 2.07  Beza Steppe C. Trenity Pha, [facility] [address] [place], [place] [address] [phone]

## 2017-06-14 NOTE — Patient Instructions (Signed)
Your physician has recommended you make the following change in your medication:  1)Stop aspirin

## 2017-06-15 NOTE — Addendum Note (Signed)
Encounter addended by: Sherran Needs, NP on: 06/15/2017  9:43 AM<BR>    Actions taken: Sign clinical note

## 2017-06-18 DIAGNOSIS — L570 Actinic keratosis: Secondary | ICD-10-CM | POA: Diagnosis not present

## 2017-06-18 DIAGNOSIS — C44729 Squamous cell carcinoma of skin of left lower limb, including hip: Secondary | ICD-10-CM | POA: Diagnosis not present

## 2017-06-18 DIAGNOSIS — L821 Other seborrheic keratosis: Secondary | ICD-10-CM | POA: Diagnosis not present

## 2017-06-18 DIAGNOSIS — Z85828 Personal history of other malignant neoplasm of skin: Secondary | ICD-10-CM | POA: Diagnosis not present

## 2017-06-25 ENCOUNTER — Other Ambulatory Visit (HOSPITAL_COMMUNITY): Payer: Self-pay | Admitting: *Deleted

## 2017-06-25 DIAGNOSIS — Z1212 Encounter for screening for malignant neoplasm of rectum: Secondary | ICD-10-CM | POA: Diagnosis not present

## 2017-06-25 MED ORDER — APIXABAN 5 MG PO TABS: 5.0000 mg | ORAL_TABLET | Freq: Two times a day (BID) | ORAL | 6 refills | Status: DC

## 2017-06-28 ENCOUNTER — Telehealth (INDEPENDENT_AMBULATORY_CARE_PROVIDER_SITE_OTHER): Payer: Self-pay | Admitting: Orthopaedic Surgery

## 2017-06-28 DIAGNOSIS — G43809 Other migraine, not intractable, without status migrainosus: Secondary | ICD-10-CM | POA: Diagnosis not present

## 2017-06-28 DIAGNOSIS — R51 Headache: Secondary | ICD-10-CM | POA: Diagnosis not present

## 2017-06-28 DIAGNOSIS — M791 Myalgia: Secondary | ICD-10-CM | POA: Diagnosis not present

## 2017-06-28 DIAGNOSIS — G518 Other disorders of facial nerve: Secondary | ICD-10-CM | POA: Diagnosis not present

## 2017-06-28 DIAGNOSIS — M542 Cervicalgia: Secondary | ICD-10-CM | POA: Diagnosis not present

## 2017-06-28 NOTE — Telephone Encounter (Signed)
Please advise 

## 2017-06-28 NOTE — Telephone Encounter (Signed)
Patient called again this afternoon wanting to know what he could take for pain that does not have aspirin it in.

## 2017-06-28 NOTE — Telephone Encounter (Signed)
Patient called this morning stating that he takes Naproxen every day and was recently told that he can not take any pain medication with aspirin.  He would like to know if there is anything without aspirin he can take for pain or if there is something Dr. Durward Fortes can prescribe for him.  CB#[phone]

## 2017-06-29 NOTE — Telephone Encounter (Signed)
LVMOM as to what PW stated.

## 2017-06-29 NOTE — Telephone Encounter (Signed)
Patient would like to try the Tramadol to see if that works for him.

## 2017-06-29 NOTE — Telephone Encounter (Signed)
Advil or tramadol

## 2017-07-11 DIAGNOSIS — R51 Headache: Secondary | ICD-10-CM | POA: Diagnosis not present

## 2017-07-11 DIAGNOSIS — G43809 Other migraine, not intractable, without status migrainosus: Secondary | ICD-10-CM | POA: Diagnosis not present

## 2017-07-11 DIAGNOSIS — M542 Cervicalgia: Secondary | ICD-10-CM | POA: Diagnosis not present

## 2017-07-11 DIAGNOSIS — G518 Other disorders of facial nerve: Secondary | ICD-10-CM | POA: Diagnosis not present

## 2017-07-11 DIAGNOSIS — M791 Myalgia: Secondary | ICD-10-CM | POA: Diagnosis not present

## 2017-07-12 ENCOUNTER — Encounter (HOSPITAL_COMMUNITY): Payer: Self-pay | Admitting: Nurse Practitioner

## 2017-07-12 ENCOUNTER — Ambulatory Visit (HOSPITAL_COMMUNITY)
Admission: RE | Admit: 2017-07-12 | Discharge: 2017-07-12 | Disposition: A | Discharge status: Home / Self Care | Payer: PPO | Source: Ambulatory Visit | Attending: Nurse Practitioner | Admitting: Nurse Practitioner

## 2017-07-12 VITALS — BP 112/64 | Ht 70.0 in | Wt 159.6 lb

## 2017-07-12 DIAGNOSIS — I10 Essential (primary) hypertension: Secondary | ICD-10-CM | POA: Insufficient documentation

## 2017-07-12 DIAGNOSIS — Z7901 Long term (current) use of anticoagulants: Secondary | ICD-10-CM | POA: Diagnosis not present

## 2017-07-12 DIAGNOSIS — M1990 Unspecified osteoarthritis, unspecified site: Secondary | ICD-10-CM | POA: Insufficient documentation

## 2017-07-12 DIAGNOSIS — E785 Hyperlipidemia, unspecified: Secondary | ICD-10-CM | POA: Insufficient documentation

## 2017-07-12 DIAGNOSIS — I480 Paroxysmal atrial fibrillation: Secondary | ICD-10-CM

## 2017-07-12 DIAGNOSIS — Z85828 Personal history of other malignant neoplasm of skin: Secondary | ICD-10-CM | POA: Insufficient documentation

## 2017-07-12 DIAGNOSIS — Z79899 Other long term (current) drug therapy: Secondary | ICD-10-CM | POA: Diagnosis not present

## 2017-07-12 NOTE — Progress Notes (Signed)
Primary Care Physician: Reynold Bowen, MD Referring Physician: Same   Alan Knight is a 71 y.o. male with a h/o HTN, and newly diagnosed afib that was seen at time of physical yesterday with Dr. Forde Dandy. He was asymptomatic and rate was controlled at 77 bpm,  with h/o bradycardia. He is in the afib clinic for further evaluation. EKG today shows Sinus brady at 57 bpm. He can tell no difference today from yesterday as far as he feels.He is active and denies any significant snoring history. No excessive caffeine. Usually drinks alcohol x1 drink nightly. Plays golf 3x a week and goes to the gym regularly. Was started on eliquis 5 mg bid for a CHA2DS2VASc score of 2(age,HTN).  Returns to [facility], 7/19. Pt did buy a Kardia and strips reviewed. A lot of the strips were erroneously read out as afib due to some irregularity but there were very clear P waves present. The day that pt felt poorly was last week playing golf in the heat and Kardia did read correctly afib with v rates in the 90's. He has had other vigorous activities that did not involve afib. He is doing well on the eliquis without bleeding issues.He had brady with v rate around 50 bpm in SR.  Today, he denies symptoms of palpitations, chest pain, shortness of breath, orthopnea, PND, lower extremity edema, dizziness, presyncope, syncope, or neurologic sequela. The patient is tolerating medications without difficulties and is otherwise without complaint today.   Past Medical History:  Diagnosis Date  . Arthritis   . Basal cell carcinoma   . BPH (benign prostatic hypertrophy) with urinary obstruction   . Diverticulosis   . Hyperlipidemia   . Hypertension   . Hypogonadism male   . Internal hemorrhoids   . OA (osteoarthritis)    Past Surgical History:  Procedure Laterality Date  . CARPAL TUNNEL RELEASE     left  . HIP ARTHROPLASTY    . JOINT REPLACEMENT    . KNEE ARTHROPLASTY     right  . KNEE SURGERY    . SHOULDER SURGERY     bilateral  . TOTAL KNEE ARTHROPLASTY     left  . TOTAL KNEE ARTHROPLASTY Left 11/09/2015   Procedure: TOTAL KNEE ARTHROPLASTY;  Surgeon: Garald Balding, MD;  Location: [facility];  Service: Orthopedics;  Laterality: Left;    Current Outpatient Prescriptions  Medication Sig Dispense Refill  . amoxicillin (AMOXIL) 500 MG tablet Take 1 tablet (500 mg total) by mouth once as needed. Take one capsule one hour prior to any dental procedure. 21 tablet 0  . apixaban (ELIQUIS) 5 MG TABS tablet Take 1 tablet (5 mg total) by mouth 2 (two) times daily. 60 tablet 6  . Ascorbic Acid (VITAMIN C) 1000 MG tablet Take 1,000 mg by mouth daily.    . Calcium Citrate-Vitamin D (CALCIUM CITRATE +D PO) Take 1 tablet by mouth daily. With Magnesium and Zinc    . Cholecalciferol (VITAMIN D3) 5000 UNITS CAPS Take 1 capsule by mouth daily.    . fluticasone (FLONASE) 50 MCG/ACT nasal spray Place 2 sprays into the nose daily as needed for allergies.     Marland Kitchen lisinopril (PRINIVIL,ZESTRIL) 5 MG tablet Take 5 mg by mouth daily.    . simvastatin (ZOCOR) 40 MG tablet Take 40 mg by mouth daily.    Marland Kitchen zonisamide (ZONEGRAN) 25 MG capsule 1 capsule.  1   No current facility-administered medications for this encounter.     No Known Allergies  Social History   Social History  . Marital status: Married    Spouse name: N/A  . Number of children: N/A  . Years of education: N/A   Occupational History  . Sales    Social History Main Topics  . Smoking status: Never Smoker  . Smokeless tobacco: Never Used  . Alcohol use Yes     Comment: 1 mixed drink per day  . Drug use: No  . Sexual activity: Not on file   Other Topics Concern  . Not on file   Social History Narrative  . No narrative on file    Family History  Problem Relation Age of Onset  . Heart attack Father   . CVA Brother     ROS- All systems are reviewed and negative except as per the HPI above  Physical Exam: Vitals:   07/12/17 1111  BP: 112/64    Weight: 159 lb 9.6 oz (72.4 kg)  Height: 5\' 10"  (1.778 m)   Wt Readings from Last 3 Encounters:  07/12/17 159 lb 9.6 oz (72.4 kg)  06/14/17 159 lb 9.6 oz (72.4 kg)  05/14/17 162 lb (73.5 kg)    Labs: Lab Results  Component Value Date   NA 134 (L) 11/11/2015   K 4.2 11/11/2015   CL 100 (L) 11/11/2015   CO2 29 11/11/2015   GLUCOSE 116 (H) 11/11/2015   BUN 11 11/11/2015   CREATININE 0.68 11/11/2015   CALCIUM 8.7 (L) 11/11/2015   Lab Results  Component Value Date   INR 1.01 10/28/2015   No results found for: CHOL, HDL, LDLCALC, TRIG   GEN- The patient is well appearing, alert and oriented x 3 today.   Head- normocephalic, atraumatic Eyes-  Sclera clear, conjunctiva pink Ears- hearing intact Oropharynx- clear Neck- supple, no JVP Lymph- no cervical lymphadenopathy Lungs- Clear to ausculation bilaterally, normal work of breathing Heart- Regular rate and rhythm, no murmurs, rubs or gallops, PMI not laterally displaced GI- soft, NT, ND, + BS Extremities- no clubbing, cyanosis, or edema MS- no significant deformity or atrophy Skin- no rash or lesion Psych- euthymic mood, full affect Neuro- strength and sensation are intact  EKG-sinus brady at 50 bpm, Pr int 144 ms, qrs int 82 nms, qtc 399 ms Office records reviewed Echo-2016- basically normal echo Recent labs reviewed- unremarkable cmet/cbc/chol, creatinine 0.9, TSH WNL at 2.07 Kardia strips reviewed   Assessment and Plan: 1. New onset paroxysmal  afib  Today he is in SR  Still trying to establish burden and if burden/ symptoms warrant antiarrythmic /abaltion If intervention is needed, feel that tikosyn may be indicated as that it does not contribute to lowering heart rate. Some pts with brady at baseline are candidates to go to ablation  without failing AAD, if burden/ symptoms justify Will update echo next week , cbc to be drawn at that time as they have time constraints today Will not start rate control 2/2 brady  at baseline and rate controlled in afib Continue eliquis for a CHA2DS2VASc score of 2(age/htn) Bleeding precautions discussed Continue to track symptoms with smart phone app and I will see again  in 6-8 weeks    Butch Penny C. Ilan Kahrs, [facility] [address] [place], [place] [address] [phone]

## 2017-07-12 NOTE — Patient Instructions (Signed)
Your physician has requested that you have an echocardiogram. Echocardiography is a painless test that uses sound waves to create images of your heart. It provides your doctor with information about the size and shape of your heart and how well your heart's chambers and valves are working. This procedure takes approximately one hour. There are no restrictions for this procedure.  This is scheduled Wednesday July 25th at 8:00  You'll see [place] after for bloodwork

## 2017-07-18 ENCOUNTER — Other Ambulatory Visit (HOSPITAL_COMMUNITY): Payer: PPO

## 2017-07-18 ENCOUNTER — Other Ambulatory Visit (HOSPITAL_COMMUNITY): Payer: PPO | Admitting: Nurse Practitioner

## 2017-07-20 ENCOUNTER — Ambulatory Visit (HOSPITAL_COMMUNITY)
Admission: RE | Admit: 2017-07-20 | Discharge: 2017-07-20 | Disposition: A | Discharge status: Home / Self Care | Payer: PPO | Source: Ambulatory Visit | Attending: Nurse Practitioner | Admitting: Nurse Practitioner

## 2017-07-20 DIAGNOSIS — I071 Rheumatic tricuspid insufficiency: Secondary | ICD-10-CM | POA: Insufficient documentation

## 2017-07-20 DIAGNOSIS — I480 Paroxysmal atrial fibrillation: Secondary | ICD-10-CM | POA: Diagnosis not present

## 2017-07-20 DIAGNOSIS — I10 Essential (primary) hypertension: Secondary | ICD-10-CM | POA: Diagnosis not present

## 2017-07-20 DIAGNOSIS — E785 Hyperlipidemia, unspecified: Secondary | ICD-10-CM | POA: Diagnosis not present

## 2017-07-20 LAB — CBC
HCT: 43.8 % (ref 39.0–52.0)
Hemoglobin: 14.4 g/dL (ref 13.0–17.0)
MCH: 30.6 pg (ref 26.0–34.0)
MCHC: 32.9 g/dL (ref 30.0–36.0)
MCV: 93 fL (ref 78.0–100.0)
Platelets: 210 10*3/uL (ref 150–400)
RBC: 4.71 MIL/uL (ref 4.22–5.81)
RDW: 15.2 % (ref 11.5–15.5)
WBC: 4.6 10*3/uL (ref 4.0–10.5)

## 2017-07-20 NOTE — Progress Notes (Signed)
  Echocardiogram 2D Echocardiogram has been performed.  Alan Knight 07/20/2017, 12:11 PM

## 2017-07-26 ENCOUNTER — Ambulatory Visit (INDEPENDENT_AMBULATORY_CARE_PROVIDER_SITE_OTHER): Payer: PPO | Admitting: Orthopaedic Surgery

## 2017-07-26 ENCOUNTER — Encounter (INDEPENDENT_AMBULATORY_CARE_PROVIDER_SITE_OTHER): Payer: Self-pay | Admitting: Orthopaedic Surgery

## 2017-07-26 VITALS — BP 123/60 | HR 64 | Ht 70.0 in | Wt 150.0 lb

## 2017-07-26 DIAGNOSIS — Z96652 Presence of left artificial knee joint: Secondary | ICD-10-CM | POA: Diagnosis not present

## 2017-07-26 DIAGNOSIS — Z96651 Presence of right artificial knee joint: Secondary | ICD-10-CM

## 2017-07-26 MED ORDER — DICLOFENAC SODIUM 1 % TD GEL: 2.0000 g | Freq: Four times a day (QID) | TRANSDERMAL | 1 refills | Status: DC

## 2017-07-26 NOTE — Progress Notes (Signed)
Office Visit Note   Patient: Alan Knight.           Date of Birth: 01/28/1946           MRN: [id number] Visit Date: 07/26/2017              Requested by: Alan Bowen, MD Alan Knight, [place] [address] PCP: Alan Bowen, MD   Assessment & Plan: Visit Diagnoses:  1. Presence of left artificial knee joint   2. History of left knee replacement   3. History of total right knee replacement     Plan: voltaren gel to both knees, follow-up as needed. Follow-Up Instructions: I've also discussed with him regarding calling his cardiologist to see if he be a candidate for a cardiac ablation related to his atrial fibrillation. He could then get off the eliquis   and back on the Naprosyn .  Orders:  No orders of the defined types were placed in this encounter.  Meds ordered this encounter  Medications  . diclofenac sodium (VOLTAREN) 1 % GEL    Sig: Apply 2 g topically 4 (four) times daily.    Dispense:  3 Tube    Refill:  1      Procedures: No procedures performed   Clinical Data: No additional findings.   Subjective: Chief Complaint  Patient presents with  . Right Knee - Pain    Mr. Alan Knight has had multiple knee surgeries Bilateral.   . Left Knee - Pain  Alan Knight has  had prior right and left total knee replacements. His postoperative course was uneventful in each instance are then he developed some limitation of motion. He presently has about 95- 100 degrees of flexion; he is able to function well and play golf. He recently was diagnosed with atrial fibrillation and is now on Eliquis. Since that time he has been unable to take his normal Naprosyn. He was taking the Naprosyn because of multiple joint pain and stiffness including his knee replacements. He is not having any pain but just "stiffness." He wanted to some suggestions as to treatment options  HPI  Review of Systems   Objective: Vital Signs: BP 123/60   Pulse 64   Ht 5\' 10"  (1.778 m)   Wt 150 lb  (68 kg)   BMI 21.52 kg/m   Physical Exam  Ortho Exam right and left knee with full extension. No increased heat. Minimal effusion left knee. No instability. Minimal opening medially right knee of about a millimeter or 2 with a valgus stress but absolutely no localized areas of tenderness redness or ecchymosis. Flexion is about symmetrical bilaterally at about 95-100. Neurovascular exam intact. No hip pain.  Specialty Comments:  No specialty comments available.  Imaging: No results found.   PMFS History: Patient Active Problem List   Diagnosis Date Noted  . Spondylosis without myelopathy or radiculopathy, lumbar region 05/17/2017  . Chronic bilateral low back pain without sciatica 05/17/2017  . Primary osteoarthritis of left knee 11/09/2015  . S/P total knee replacement using cement 11/09/2015   Past Medical History:  Diagnosis Date  . Arthritis   . Basal cell carcinoma   . BPH (benign prostatic hypertrophy) with urinary obstruction   . Diverticulosis   . Hyperlipidemia   . Hypertension   . Hypogonadism male   . Internal hemorrhoids   . OA (osteoarthritis)     Family History  Problem Relation Age of Onset  . Heart attack Father   . CVA Brother  Past Surgical History:  Procedure Laterality Date  . CARPAL TUNNEL RELEASE     left  . HIP ARTHROPLASTY    . JOINT REPLACEMENT    . KNEE ARTHROPLASTY     right  . KNEE SURGERY    . SHOULDER SURGERY     bilateral  . TOTAL KNEE ARTHROPLASTY     left  . TOTAL KNEE ARTHROPLASTY Left 11/09/2015   Procedure: TOTAL KNEE ARTHROPLASTY;  Surgeon: Alan Balding, MD;  Location: [facility];  Service: Orthopedics;  Laterality: Left;   Social History   Occupational History  . Sales    Social History Main Topics  . Smoking status: Never Smoker  . Smokeless tobacco: Never Used  . Alcohol use Yes     Comment: 1 mixed drink per day  . Drug use: No  . Sexual activity: Not on file     Alan Balding, MD   Note - This  record has been created using [employer].  Chart creation errors have been sought, but may not always  have been located. Such creation errors do not reflect on  the standard of medical care.

## 2017-07-31 ENCOUNTER — Telehealth (INDEPENDENT_AMBULATORY_CARE_PROVIDER_SITE_OTHER): Payer: Self-pay | Admitting: Orthopaedic Surgery

## 2017-07-31 NOTE — Telephone Encounter (Signed)
Patient's wife called about needing a referral for Alan Knight. She states this was discussed last week at his appt with Dr. Durward Fortes. They need the referral to state "pain in knees and osteoarthritis." Fax# [phone].  Please call patient or his wife after referral is sent.

## 2017-07-31 NOTE — Telephone Encounter (Signed)
Call pt and have his PCP make referral and explain why

## 2017-07-31 NOTE — Telephone Encounter (Signed)
Please advise. Should his PCP do this?

## 2017-07-31 NOTE — Telephone Encounter (Signed)
Wife called back and I told her message

## 2017-08-01 ENCOUNTER — Other Ambulatory Visit (INDEPENDENT_AMBULATORY_CARE_PROVIDER_SITE_OTHER): Payer: Self-pay

## 2017-08-01 ENCOUNTER — Telehealth (INDEPENDENT_AMBULATORY_CARE_PROVIDER_SITE_OTHER): Payer: Self-pay

## 2017-08-01 DIAGNOSIS — R51 Headache: Secondary | ICD-10-CM | POA: Diagnosis not present

## 2017-08-01 DIAGNOSIS — M791 Myalgia: Secondary | ICD-10-CM | POA: Diagnosis not present

## 2017-08-01 DIAGNOSIS — G43809 Other migraine, not intractable, without status migrainosus: Secondary | ICD-10-CM | POA: Diagnosis not present

## 2017-08-01 DIAGNOSIS — G518 Other disorders of facial nerve: Secondary | ICD-10-CM | POA: Diagnosis not present

## 2017-08-01 DIAGNOSIS — M170 Bilateral primary osteoarthritis of knee: Secondary | ICD-10-CM

## 2017-08-01 DIAGNOSIS — M542 Cervicalgia: Secondary | ICD-10-CM | POA: Diagnosis not present

## 2017-08-01 NOTE — Telephone Encounter (Signed)
Called and spoke with wife Sli and sent referral today. Told her they will call her and she should follow up in she hasn't heard from them in a week.

## 2017-08-23 DIAGNOSIS — M542 Cervicalgia: Secondary | ICD-10-CM | POA: Diagnosis not present

## 2017-08-23 DIAGNOSIS — M791 Myalgia: Secondary | ICD-10-CM | POA: Diagnosis not present

## 2017-08-23 DIAGNOSIS — R51 Headache: Secondary | ICD-10-CM | POA: Diagnosis not present

## 2017-08-23 DIAGNOSIS — G518 Other disorders of facial nerve: Secondary | ICD-10-CM | POA: Diagnosis not present

## 2017-08-23 DIAGNOSIS — G43809 Other migraine, not intractable, without status migrainosus: Secondary | ICD-10-CM | POA: Diagnosis not present

## 2017-08-30 ENCOUNTER — Encounter (HOSPITAL_COMMUNITY): Payer: Self-pay | Admitting: Nurse Practitioner

## 2017-08-30 ENCOUNTER — Ambulatory Visit (HOSPITAL_COMMUNITY)
Admission: RE | Admit: 2017-08-30 | Discharge: 2017-08-30 | Disposition: A | Discharge status: Home / Self Care | Payer: PPO | Source: Ambulatory Visit | Attending: Nurse Practitioner | Admitting: Nurse Practitioner

## 2017-08-30 VITALS — BP 112/58 | HR 60 | Ht 70.0 in | Wt 162.8 lb

## 2017-08-30 DIAGNOSIS — I480 Paroxysmal atrial fibrillation: Secondary | ICD-10-CM | POA: Insufficient documentation

## 2017-08-30 DIAGNOSIS — Z823 Family history of stroke: Secondary | ICD-10-CM | POA: Diagnosis not present

## 2017-08-30 DIAGNOSIS — Z9889 Other specified postprocedural states: Secondary | ICD-10-CM | POA: Diagnosis not present

## 2017-08-30 DIAGNOSIS — I10 Essential (primary) hypertension: Secondary | ICD-10-CM | POA: Diagnosis not present

## 2017-08-30 DIAGNOSIS — Z85828 Personal history of other malignant neoplasm of skin: Secondary | ICD-10-CM | POA: Insufficient documentation

## 2017-08-30 DIAGNOSIS — E785 Hyperlipidemia, unspecified: Secondary | ICD-10-CM | POA: Insufficient documentation

## 2017-08-30 DIAGNOSIS — Z79899 Other long term (current) drug therapy: Secondary | ICD-10-CM | POA: Diagnosis not present

## 2017-08-30 NOTE — Progress Notes (Signed)
Primary Care Physician: Reynold Bowen, MD Referring Physician: Same   Alan Plasencia. is a 71 y.o. male with a h/o HTN, and newly diagnosed afib that was seen at time of physical yesterday with Dr. Forde Dandy. He was asymptomatic and rate was controlled at 77 bpm,  with h/o bradycardia. He is in the afib clinic for further evaluation. EKG today shows Sinus brady at 57 bpm. He can tell no difference today from yesterday as far as he feels.He is active and denies any significant snoring history. No excessive caffeine. Usually drinks alcohol x1 drink nightly. Plays golf 3x a week and goes to the gym regularly. Was started on eliquis 5 mg bid for a CHA2DS2VASc score of 2(age,HTN).  Returns to [facility] clinic, 7/19. Pt did buy a Kardia and strips reviewed. A lot of the strips were erroneously read out as afib due to some irregularity but there were very clear P waves present. The day that pt felt poorly was last week playing golf in the heat and Kardia did read correctly afib with v rates in the 90's. He has had other vigorous activities that did not involve afib. He is doing well on the eliquis without bleeding issues.He had brady with v rate around 50 bpm in SR.  Returns to [facility] clinc, 9/6. He is in SR but states that he feels more fatigue than he use too. Kardia reviewed and still having spells of afib several times a week, but is not fast, usually in the 70's. When is SR, in the 50's. Because of this, he is not on rate control. He is also concerned because he has had to stop NSAIDS form being on DOAC for chadsvasc score of 2(age, htn). He would like to take NSAIDS again for a chronically sore,stiff rt kneen. Explained bleeding risk. He does work out at [employer] several times a week and plays golf on a regular basis and feels that he does not have the energy for these activities as he once did.  Today, he denies symptoms of palpitations, chest pain, shortness of breath, orthopnea, PND, lower extremity edema,  dizziness, presyncope, syncope, or neurologic sequela. The patient is tolerating medications without difficulties and is otherwise without complaint today.   Past Medical History:  Diagnosis Date  . Arthritis   . Basal cell carcinoma   . BPH (benign prostatic hypertrophy) with urinary obstruction   . Diverticulosis   . Hyperlipidemia   . Hypertension   . Hypogonadism male   . Internal hemorrhoids   . OA (osteoarthritis)    Past Surgical History:  Procedure Laterality Date  . CARPAL TUNNEL RELEASE     left  . HIP ARTHROPLASTY    . JOINT REPLACEMENT    . KNEE ARTHROPLASTY     right  . KNEE SURGERY    . SHOULDER SURGERY     bilateral  . TOTAL KNEE ARTHROPLASTY     left  . TOTAL KNEE ARTHROPLASTY Left 11/09/2015   Procedure: TOTAL KNEE ARTHROPLASTY;  Surgeon: Garald Balding, MD;  Location: [facility];  Service: Orthopedics;  Laterality: Left;    Current Outpatient Prescriptions  Medication Sig Dispense Refill  . apixaban (ELIQUIS) 5 MG TABS tablet Take 1 tablet (5 mg total) by mouth 2 (two) times daily. 60 tablet 6  . Ascorbic Acid (VITAMIN C) 1000 MG tablet Take 1,000 mg by mouth daily.    . Calcium Citrate-Vitamin D (CALCIUM CITRATE +D PO) Take 1 tablet by mouth daily. With Magnesium  and Zinc    . Cholecalciferol (VITAMIN D3) 5000 UNITS CAPS Take 1 capsule by mouth daily.    . diclofenac sodium (VOLTAREN) 1 % GEL Apply 2 g topically 4 (four) times daily. 3 Tube 1  . fluticasone (FLONASE) 50 MCG/ACT nasal spray Place 2 sprays into the nose daily as needed for allergies.     Marland Kitchen lisinopril (PRINIVIL,ZESTRIL) 5 MG tablet Take 5 mg by mouth daily.    . simvastatin (ZOCOR) 40 MG tablet Take 40 mg by mouth daily.    Marland Kitchen zonisamide (ZONEGRAN) 25 MG capsule 1 capsule.  1   No current facility-administered medications for this encounter.     No Known Allergies  Social History   Social History  . Marital status: Married    Spouse name: N/A  . Number of children: N/A  . Years of  education: N/A   Occupational History  . Sales    Social History Main Topics  . Smoking status: Never Smoker  . Smokeless tobacco: Never Used  . Alcohol use Yes     Comment: 1 mixed drink per day  . Drug use: No  . Sexual activity: Not on file   Other Topics Concern  . Not on file   Social History Narrative  . No narrative on file    Family History  Problem Relation Age of Onset  . Heart attack Father   . CVA Brother     ROS- All systems are reviewed and negative except as per the HPI above  Physical Exam: Vitals:   08/30/17 1001  BP: (!) 112/58  Pulse: 60  Weight: 162 lb 12.8 oz (73.8 kg)  Height: 5\' 10"  (1.778 m)   Wt Readings from Last 3 Encounters:  08/30/17 162 lb 12.8 oz (73.8 kg)  07/26/17 150 lb (68 kg)  07/12/17 159 lb 9.6 oz (72.4 kg)    Labs: Lab Results  Component Value Date   NA 134 (L) 11/11/2015   K 4.2 11/11/2015   CL 100 (L) 11/11/2015   CO2 29 11/11/2015   GLUCOSE 116 (H) 11/11/2015   BUN 11 11/11/2015   CREATININE 0.68 11/11/2015   CALCIUM 8.7 (L) 11/11/2015   Lab Results  Component Value Date   INR 1.01 10/28/2015   No results found for: CHOL, HDL, LDLCALC, TRIG   GEN- The patient is well appearing, alert and oriented x 3 today.   Head- normocephalic, atraumatic Eyes-  Sclera clear, conjunctiva pink Ears- hearing intact Oropharynx- clear Neck- supple, no JVP Lymph- no cervical lymphadenopathy Lungs- Clear to ausculation bilaterally, normal work of breathing Heart- Regular rate and rhythm, no murmurs, rubs or gallops, PMI not laterally displaced GI- soft, NT, ND, + BS Extremities- no clubbing, cyanosis, or edema MS- no significant deformity or atrophy Skin- no rash or lesion Psych- euthymic mood, full affect Neuro- strength and sensation are intact  EKG-Sinus rhythm at 60 bpm, pr int 140 ms, qrs int 84 ms, qtc 420 ms Office records reviewed Echo-2018 Study Conclusions  - Left ventricle: GLSS is normal at -21.8% The  cavity size was   normal. There was mild concentric hypertrophy. Systolic function   was normal. The estimated ejection fraction was in the range of   55% to 60%. Wall motion was normal; there were no regional wall   motion abnormalities. - Aortic valve: Moderately calcified annulus. Trileaflet; normal   thickness, mildly calcified leaflets. - Left atrium: The atrium was mildly dilated. - Pulmonic valve: There was trivial regurgitation. -  Pulmonary arteries: PA peak pressure: 31 mm Hg (S). Recent labs reviewed- unremarkable cmet/cbc/chol, creatinine 0.9, TSH WNL at 2.07  Kardia strips reviewed   Assessment and Plan: 1. New onset paroxysmal  afib  Today he is in SR He is having several episodes a week, non sustained, but treatment is complicated by brady in SR and fear of worsening baseline brady by addition of AAD's  He is not really interested in the hospital stay for [place]. He has had several friends that have ablations and are doing well. This may be a reasonable first line  approach with bradycardia. HE is not on rate control 2/2 brady at baseline and rate controlled in afib Continue eliquis for a CHA2DS2VASc score of 2(age/htn) Bleeding precautions discussed Encouraged to talk to PCP re pain alternatives for knee issues since he is not taking NSAIDS Continue to track symptoms with smart phone app and I will refer to Dr. Rayann Heman for treatment options  Alan Knight, [facility] [address] [place], [place] [address] [phone]

## 2017-09-06 DIAGNOSIS — G518 Other disorders of facial nerve: Secondary | ICD-10-CM | POA: Diagnosis not present

## 2017-09-06 DIAGNOSIS — G43809 Other migraine, not intractable, without status migrainosus: Secondary | ICD-10-CM | POA: Diagnosis not present

## 2017-09-06 DIAGNOSIS — M791 Myalgia: Secondary | ICD-10-CM | POA: Diagnosis not present

## 2017-09-06 DIAGNOSIS — M542 Cervicalgia: Secondary | ICD-10-CM | POA: Diagnosis not present

## 2017-09-06 DIAGNOSIS — R51 Headache: Secondary | ICD-10-CM | POA: Diagnosis not present

## 2017-09-17 DIAGNOSIS — L821 Other seborrheic keratosis: Secondary | ICD-10-CM | POA: Diagnosis not present

## 2017-09-17 DIAGNOSIS — L570 Actinic keratosis: Secondary | ICD-10-CM | POA: Diagnosis not present

## 2017-09-17 DIAGNOSIS — D225 Melanocytic nevi of trunk: Secondary | ICD-10-CM | POA: Diagnosis not present

## 2017-09-17 DIAGNOSIS — Z85828 Personal history of other malignant neoplasm of skin: Secondary | ICD-10-CM | POA: Diagnosis not present

## 2017-09-20 ENCOUNTER — Telehealth (INDEPENDENT_AMBULATORY_CARE_PROVIDER_SITE_OTHER): Payer: Self-pay | Admitting: Orthopaedic Surgery

## 2017-09-20 NOTE — Telephone Encounter (Signed)
Please advise 

## 2017-09-20 NOTE — Telephone Encounter (Signed)
Per patient he has been taking Naproxen for knee pain for years, and has been recently told he can no longer take that. Per patient he would like to know if he could get an rx for Tramadol to help with knee pain. Wife has an rx for this, and patient tried two pills which helped with his pain. Please call to advise.

## 2017-09-20 NOTE — Telephone Encounter (Signed)
Tramadol 50mg  #40 1-2 tabs po qd

## 2017-09-24 ENCOUNTER — Encounter: Payer: Self-pay | Admitting: Internal Medicine

## 2017-09-24 ENCOUNTER — Encounter (INDEPENDENT_AMBULATORY_CARE_PROVIDER_SITE_OTHER): Payer: Self-pay

## 2017-09-24 ENCOUNTER — Other Ambulatory Visit (INDEPENDENT_AMBULATORY_CARE_PROVIDER_SITE_OTHER): Payer: Self-pay

## 2017-09-24 ENCOUNTER — Ambulatory Visit (INDEPENDENT_AMBULATORY_CARE_PROVIDER_SITE_OTHER): Payer: PPO | Admitting: Internal Medicine

## 2017-09-24 ENCOUNTER — Telehealth (HOSPITAL_COMMUNITY): Payer: Self-pay | Admitting: *Deleted

## 2017-09-24 VITALS — BP 126/74 | Ht 70.0 in | Wt 162.8 lb

## 2017-09-24 DIAGNOSIS — T733XXA Exhaustion due to excessive exertion, initial encounter: Secondary | ICD-10-CM | POA: Diagnosis not present

## 2017-09-24 DIAGNOSIS — R001 Bradycardia, unspecified: Secondary | ICD-10-CM | POA: Diagnosis not present

## 2017-09-24 DIAGNOSIS — I480 Paroxysmal atrial fibrillation: Secondary | ICD-10-CM | POA: Diagnosis not present

## 2017-09-24 MED ORDER — TRAMADOL HCL 50 MG PO TABS: ORAL_TABLET | ORAL | 0 refills | Status: DC

## 2017-09-24 NOTE — Telephone Encounter (Signed)
Left message on voicemail in reference to upcoming appointment scheduled for 09/26/17. Phone number given for a call back so details instructions can be given.  Alan Knight

## 2017-09-24 NOTE — Progress Notes (Signed)
Electrophysiology Office Note   Date:  09/24/2017   ID:  Alan Knight., DOB 1946-10-24, MRN [id number]  PCP:  Alan Bowen, MD   Primary Electrophysiologist: Alan Grayer, MD    Chief Complaint  Patient presents with  . Atrial Fibrillation     History of Present Illness: Alan Knight. is a 71 y.o. male who presents today for electrophysiology evaluation.   The patient is referred by Alan Palau NP in the AF clinic for EP consultation regarding afib.  He reports initially being diagnosed with atrial fibrillation 6/18 after presenting to Dr Alan Knight and being found to have afib at that time.  He was referred to the AF clinic and was found to have converted to sinus rhythm.  He was unaware of any change in his clinical condition.  He reports occasional fatigue.  He finds that when playing golf or exercising that he has extreme fatigue and is unable to exercise at his normal capacity.  He is not sure if he is in afib or not.  He also is unaware as to what his heart rates are with exercise.   He has sinus bradycardia which limits medical therapy.  He is referred for further evaluation.   Today, he denies symptoms of palpitations, chest pain, shortness of breath, orthopnea, PND, lower extremity edema, claudication, dizziness, presyncope, syncope, bleeding, or neurologic sequela. The patient is tolerating medications without difficulties and is otherwise without complaint today.    Past Medical History:  Diagnosis Date  . Arthritis   . Basal cell carcinoma   . BPH (benign prostatic hypertrophy) with urinary obstruction   . Diverticulosis   . Hyperlipidemia   . Hypertension   . Hypogonadism male   . Internal hemorrhoids   . OA (osteoarthritis)   . Paroxysmal atrial fibrillation [facility])    Past Surgical History:  Procedure Laterality Date  . CARPAL TUNNEL RELEASE     left  . HIP ARTHROPLASTY    . JOINT REPLACEMENT    . KNEE ARTHROPLASTY     right  . KNEE SURGERY    .  SHOULDER SURGERY     bilateral  . TOTAL KNEE ARTHROPLASTY     left  . TOTAL KNEE ARTHROPLASTY Left 11/09/2015   Procedure: TOTAL KNEE ARTHROPLASTY;  Surgeon: Garald Balding, MD;  Location: [facility];  Service: Orthopedics;  Laterality: Left;     Current Outpatient Prescriptions  Medication Sig Dispense Refill  . apixaban (ELIQUIS) 5 MG TABS tablet Take 1 tablet (5 mg total) by mouth 2 (two) times daily. 60 tablet 6  . Ascorbic Acid (VITAMIN C) 1000 MG tablet Take 1,000 mg by mouth daily.    . Calcium Citrate-Vitamin D (CALCIUM CITRATE +D PO) Take 1 tablet by mouth daily. With Magnesium and Zinc    . Cholecalciferol (VITAMIN D3) 5000 UNITS CAPS Take 1 capsule by mouth daily.    . diclofenac sodium (VOLTAREN) 1 % GEL Apply 2 g topically 4 (four) times daily. 3 Tube 1  . fluticasone (FLONASE) 50 MCG/ACT nasal spray Place 2 sprays into the nose daily as needed for allergies.     Marland Kitchen lisinopril (PRINIVIL,ZESTRIL) 5 MG tablet Take 5 mg by mouth daily.    . simvastatin (ZOCOR) 40 MG tablet Take 40 mg by mouth daily.    Marland Kitchen zonisamide (ZONEGRAN) 25 MG capsule Take 1 capsule by mouth 2 (two) times daily.   1  . traMADol (ULTRAM) 50 MG tablet 1-2 tabs po qd 40  tablet 0   No current facility-administered medications for this visit.     Allergies:   Patient has no known allergies.   Social History:  The patient  reports that he has never smoked. He has never used smokeless tobacco. He reports that he drinks alcohol. He reports that he does not use drugs.   Family History:  The patient's  family history includes CVA in his brother; Heart attack in his father.    ROS:  Please see the history of present illness.   All other systems are personally reviewed and negative.    PHYSICAL EXAM: VS:  BP 126/74   Ht 5\' 10"  (1.778 m)   Wt 162 lb 12.8 oz (73.8 kg)   SpO2 97%   BMI 23.36 kg/m  , BMI Body mass index is 23.36 kg/m. GEN: Well nourished, well developed, in no acute distress  HEENT: normal    Neck: no JVD, carotid bruits, or masses Cardiac: RRR; no murmurs, rubs, or gallops,no edema  Respiratory:  clear to auscultation bilaterally, normal work of breathing GI: soft, nontender, nondistended, + BS MS: no deformity or atrophy  Skin: warm and dry  Neuro:  Strength and sensation are intact Psych: euthymic mood, full affect  EKG:  EKG is ordered today. The ekg ordered today is personally reviewed and shows sinus bradycardia 53 bpm with PACs, otherwise normal ekg   Recent Labs: 07/20/2017: Hemoglobin 14.4; Platelets 210  personally reviewed   Lipid Panel  No results found for: CHOL, TRIG, HDL, CHOLHDL, VLDL, LDLCALC, LDLDIRECT personally reviewed   Wt Readings from Last 3 Encounters:  09/24/17 162 lb 12.8 oz (73.8 kg)  08/30/17 162 lb 12.8 oz (73.8 kg)  07/26/17 150 lb (68 kg)      Other studies personally reviewed: Additional studies/ records that were reviewed today include: AF clinic notes,  Echo 07/20/17 Review of the above records today demonstrates: EF 55-60%, mild LA enlargement  ASSESSMENT AND PLAN:  1.  Paroxysmal atrial fibrillation The patient presents with intermittent afib.  It is not clear that his symptoms of fatigue and decreased exercise tolerance are due to afib.  He has Jodelle Red but has not used this with activity.  I have asked him to document symptoms over the next four weeks to see if this corresponds to his afib. Continue eliquis No changes today  2. Sinus bradycardia Another possible cause for his exertional symptoms. See below  3. Fatigue, decreased exercise tolerance Unclear etiology Given FH of CAD in his father, we should exclude ischemia as the cause.  Differential also includes atrial fibrillation episodes and chronotropic incompetence. I will order exercise myoview to further evaluate. In addition, he will keep tack of heart rate, rhythm (via Kardia), and symptoms over the next few weeks.  I will see him again in 4 weeks for  follow-up.   Current medicines are reviewed at length with the patient today.   The patient does not have concerns regarding his medicines.  The following changes were made today:  none   Signed, Alan Grayer, MD  09/24/2017 9:47 AM     [facility] [address] [place] [address] [phone] (office) [phone] (fax)

## 2017-09-24 NOTE — Telephone Encounter (Signed)
Done

## 2017-09-24 NOTE — Patient Instructions (Addendum)
Medication Instructions:  None ordered   Labwork: None ordered   Testing/Procedures: Your physician has requested that you have en exercise stress myoview. For further information please visit [email or web address]. Please follow instruction sheet, as given.    Follow-Up: Your physician recommends that you schedule a follow-up appointment in: 4 weeks with Dr Rayann Heman   Any Other Special Instructions Will Be Listed Below (If Applicable).     If you need a refill on your cardiac medications before your next appointment, please call your pharmacy.

## 2017-09-25 ENCOUNTER — Telehealth (HOSPITAL_COMMUNITY): Payer: Self-pay

## 2017-09-25 NOTE — Telephone Encounter (Signed)
Patient given detailed instructions per Myocardial Perfusion Study Information Sheet for the test on 09/26/2017 at 7:30. Patient notified to arrive 15 minutes early and that it is imperative to arrive on time for appointment to keep from having the test rescheduled.  If you need to cancel or reschedule your appointment, please call the office within 24 hours of your appointment. . Patient verbalized understanding.EHK

## 2017-09-26 ENCOUNTER — Ambulatory Visit (HOSPITAL_COMMUNITY): Payer: PPO | Attending: Cardiology

## 2017-09-26 DIAGNOSIS — I4891 Unspecified atrial fibrillation: Secondary | ICD-10-CM | POA: Insufficient documentation

## 2017-09-26 DIAGNOSIS — I480 Paroxysmal atrial fibrillation: Secondary | ICD-10-CM

## 2017-09-26 DIAGNOSIS — I10 Essential (primary) hypertension: Secondary | ICD-10-CM | POA: Insufficient documentation

## 2017-09-26 DIAGNOSIS — Z8249 Family history of ischemic heart disease and other diseases of the circulatory system: Secondary | ICD-10-CM | POA: Diagnosis not present

## 2017-09-26 DIAGNOSIS — X58XXXA Exposure to other specified factors, initial encounter: Secondary | ICD-10-CM | POA: Diagnosis not present

## 2017-09-26 DIAGNOSIS — T733XXA Exhaustion due to excessive exertion, initial encounter: Secondary | ICD-10-CM

## 2017-09-26 DIAGNOSIS — E785 Hyperlipidemia, unspecified: Secondary | ICD-10-CM | POA: Insufficient documentation

## 2017-09-26 LAB — MYOCARDIAL PERFUSION IMAGING
CHL CUP NUCLEAR SSS: 2
CHL CUP RESTING HR STRESS: 51 {beats}/min
CHL RATE OF PERCEIVED EXERTION: 18
CSEPHR: 87 %
CSEPPHR: 131 {beats}/min
Estimated workload: 10.1 METS
Exercise duration (min): 8 min
LV sys vol: 65 mL
LVDIAVOL: 134 mL (ref 62–150)
MPHR: 150 {beats}/min
RATE: 0.32
SDS: 2
SRS: 0
TID: 0.97

## 2017-09-26 MED ORDER — TECHNETIUM TC 99M TETROFOSMIN IV KIT
31.9000 | PACK | Freq: Once | INTRAVENOUS | Status: AC | PRN
  Administered 2017-09-26: 31.9 via INTRAVENOUS
  Filled 2017-09-26: qty 32

## 2017-09-26 MED ORDER — TECHNETIUM TC 99M TETROFOSMIN IV KIT
10.2000 | PACK | Freq: Once | INTRAVENOUS | Status: AC | PRN
  Administered 2017-09-26: 10.2 via INTRAVENOUS
  Filled 2017-09-26: qty 11

## 2017-09-27 ENCOUNTER — Telehealth: Payer: Self-pay | Admitting: *Deleted

## 2017-09-27 NOTE — Telephone Encounter (Signed)
-----   Message from Thompson Grayer, MD sent at 09/27/2017  8:11 AM EDT ----- Results reviewed.  Claiborne Billings, please inform pt of result. I will route to primary care also.

## 2017-09-27 NOTE — Telephone Encounter (Signed)
Patient informed. 

## 2017-10-03 DIAGNOSIS — Z96652 Presence of left artificial knee joint: Secondary | ICD-10-CM

## 2017-10-03 DIAGNOSIS — G8929 Other chronic pain: Secondary | ICD-10-CM

## 2017-10-03 DIAGNOSIS — Z96651 Presence of right artificial knee joint: Secondary | ICD-10-CM | POA: Diagnosis not present

## 2017-10-03 DIAGNOSIS — G894 Chronic pain syndrome: Secondary | ICD-10-CM | POA: Insufficient documentation

## 2017-10-03 DIAGNOSIS — Z96653 Presence of artificial knee joint, bilateral: Secondary | ICD-10-CM | POA: Diagnosis not present

## 2017-10-03 DIAGNOSIS — M25561 Pain in right knee: Secondary | ICD-10-CM

## 2017-10-03 DIAGNOSIS — M25562 Pain in left knee: Secondary | ICD-10-CM

## 2017-10-03 DIAGNOSIS — M25462 Effusion, left knee: Secondary | ICD-10-CM | POA: Diagnosis not present

## 2017-10-03 HISTORY — DX: Presence of left artificial knee joint: Z96.652

## 2017-10-03 HISTORY — DX: Chronic pain syndrome: G89.4

## 2017-10-03 HISTORY — DX: Other chronic pain: G89.29

## 2017-10-06 DIAGNOSIS — Z23 Encounter for immunization: Secondary | ICD-10-CM | POA: Diagnosis not present

## 2017-10-15 DIAGNOSIS — G43809 Other migraine, not intractable, without status migrainosus: Secondary | ICD-10-CM | POA: Diagnosis not present

## 2017-10-29 ENCOUNTER — Encounter: Payer: Self-pay | Admitting: Internal Medicine

## 2017-10-29 ENCOUNTER — Ambulatory Visit (INDEPENDENT_AMBULATORY_CARE_PROVIDER_SITE_OTHER): Payer: PPO | Admitting: Internal Medicine

## 2017-10-29 ENCOUNTER — Ambulatory Visit: Payer: PPO | Admitting: Internal Medicine

## 2017-10-29 VITALS — BP 112/60 | HR 52 | Ht 70.0 in | Wt 165.0 lb

## 2017-10-29 DIAGNOSIS — R001 Bradycardia, unspecified: Secondary | ICD-10-CM | POA: Diagnosis not present

## 2017-10-29 DIAGNOSIS — I480 Paroxysmal atrial fibrillation: Secondary | ICD-10-CM | POA: Diagnosis not present

## 2017-10-29 NOTE — Progress Notes (Signed)
PCP: Reynold Bowen, MD   Primary EP: Dr Gerlene Burdock. is a 71 y.o. male who presents today for routine electrophysiology followup.  Since last being seen in our clinic, the patient reports doing reasonably well. He continues to have intermittent fatigue and decreased exercise tolerance.  He feels that these symptoms correspond with afib.  He recent had exercise myoview which revealed a slightly blunted heart rate response but with good exercise effort and low risk imaging.  Today, he denies symptoms of palpitations, chest pain, shortness of breath,  lower extremity edema, dizziness, presyncope, or syncope.  The patient is otherwise without complaint today.   Past Medical History:  Diagnosis Date  . Arthritis   . Basal cell carcinoma   . BPH (benign prostatic hypertrophy) with urinary obstruction   . Diverticulosis   . Hyperlipidemia   . Hypertension   . Hypogonadism male   . Internal hemorrhoids   . OA (osteoarthritis)   . Paroxysmal atrial fibrillation [facility])    Past Surgical History:  Procedure Laterality Date  . CARPAL TUNNEL RELEASE     left  . HIP ARTHROPLASTY    . JOINT REPLACEMENT    . KNEE ARTHROPLASTY     right  . KNEE SURGERY    . SHOULDER SURGERY     bilateral  . TOTAL KNEE ARTHROPLASTY     left    ROS- all systems are reviewed and negatives except as per HPI above  Current Outpatient Medications  Medication Sig Dispense Refill  . apixaban (ELIQUIS) 5 MG TABS tablet Take 1 tablet (5 mg total) by mouth 2 (two) times daily. 60 tablet 6  . Ascorbic Acid (VITAMIN C) 1000 MG tablet Take 1,000 mg by mouth daily.    . Calcium Citrate-Vitamin D (CALCIUM CITRATE +D PO) Take 1 tablet by mouth daily. With Magnesium and Zinc    . Cholecalciferol (VITAMIN D3) 5000 UNITS CAPS Take 1 capsule by mouth daily.    . diclofenac sodium (VOLTAREN) 1 % GEL Apply 2 g topically 4 (four) times daily. 3 Tube 1  . fluticasone (FLONASE) 50 MCG/ACT nasal spray Place 2 sprays  into the nose daily as needed for allergies.     Marland Kitchen lisinopril (PRINIVIL,ZESTRIL) 5 MG tablet Take 5 mg by mouth daily.    . simvastatin (ZOCOR) 40 MG tablet Take 40 mg by mouth daily.    . traMADol (ULTRAM) 50 MG tablet Take 50-100 mg 2 (two) times daily as needed by mouth (knee pain).    Marland Kitchen zonisamide (ZONEGRAN) 25 MG capsule Take 1 capsule by mouth 2 (two) times daily.   1   No current facility-administered medications for this visit.     Physical Exam: Vitals:   10/29/17 1350  BP: 112/60  Pulse: (!) 52  SpO2: 98%  Weight: 165 lb (74.8 kg)  Height: 5\' 10"  (1.778 m)    GEN- The patient is well appearing, alert and oriented x 3 today.   Head- normocephalic, atraumatic Eyes-  Sclera clear, conjunctiva pink Ears- hearing intact Oropharynx- clear Lungs- Clear to ausculation bilaterally, normal work of breathing Heart- Regular rate and rhythm, no murmurs, rubs or gallops, PMI not laterally displaced GI- soft, NT, ND, + BS Extremities- no clubbing, cyanosis, or edema  EKG tracing ordered today is personally reviewed and shows sinus bradycardia 52 bpm, PACs, otherwise normal ekg  Assessment and Plan:  1. Paroxysmal atrial fibrillation On eliqius He continues to have episodes with fatigue and decreased exercise  tolerance.  Given sinus bradycardia, he has very few AAD options.  I would therefore advise ablation as our approach.  I am hopeful that his heart rates may actually improve post ablation. Therapeutic strategies for afib including medicine and ablation were discussed in detail with the patient today. Risk, benefits, and alternatives to EP study and radiofrequency ablation for afib were also discussed in detail today. These risks include but are not limited to stroke, bleeding, vascular damage, tamponade, perforation, damage to the esophagus, lungs, and other structures, pulmonary vein stenosis, worsening renal function, and death. The patient understands these risk and wishes to  proceed.  We will therefore proceed with catheter ablation at the next available time.  Continue anticoagulation with eliquis without interruption in th interim.  Will obtain cardiac CT prior to ablation to exclude LAA thrombus.  2. Sinus bradycardia Recent stress testing reveals that he was able to achieve heart rate of 131 bpm with exercise.  myoview was low risk Hopefully his sinus rate will improve with ablation.  Thompson Grayer MD, [facility] 10/29/2017 2:04 PM

## 2017-10-29 NOTE — H&P (View-Only) (Signed)
PCP: Reynold Bowen, MD   Primary EP: Dr Gerlene Burdock. is a 71 y.o. male who presents today for routine electrophysiology followup.  Since last being seen in our clinic, the patient reports doing reasonably well. He continues to have intermittent fatigue and decreased exercise tolerance.  He feels that these symptoms correspond with afib.  He recent had exercise myoview which revealed a slightly blunted heart rate response but with good exercise effort and low risk imaging.  Today, he denies symptoms of palpitations, chest pain, shortness of breath,  lower extremity edema, dizziness, presyncope, or syncope.  The patient is otherwise without complaint today.   Past Medical History:  Diagnosis Date  . Arthritis   . Basal cell carcinoma   . BPH (benign prostatic hypertrophy) with urinary obstruction   . Diverticulosis   . Hyperlipidemia   . Hypertension   . Hypogonadism male   . Internal hemorrhoids   . OA (osteoarthritis)   . Paroxysmal atrial fibrillation [facility])    Past Surgical History:  Procedure Laterality Date  . CARPAL TUNNEL RELEASE     left  . HIP ARTHROPLASTY    . JOINT REPLACEMENT    . KNEE ARTHROPLASTY     right  . KNEE SURGERY    . SHOULDER SURGERY     bilateral  . TOTAL KNEE ARTHROPLASTY     left    ROS- all systems are reviewed and negatives except as per HPI above  Current Outpatient Medications  Medication Sig Dispense Refill  . apixaban (ELIQUIS) 5 MG TABS tablet Take 1 tablet (5 mg total) by mouth 2 (two) times daily. 60 tablet 6  . Ascorbic Acid (VITAMIN C) 1000 MG tablet Take 1,000 mg by mouth daily.    . Calcium Citrate-Vitamin D (CALCIUM CITRATE +D PO) Take 1 tablet by mouth daily. With Magnesium and Zinc    . Cholecalciferol (VITAMIN D3) 5000 UNITS CAPS Take 1 capsule by mouth daily.    . diclofenac sodium (VOLTAREN) 1 % GEL Apply 2 g topically 4 (four) times daily. 3 Tube 1  . fluticasone (FLONASE) 50 MCG/ACT nasal spray Place 2 sprays  into the nose daily as needed for allergies.     Marland Kitchen lisinopril (PRINIVIL,ZESTRIL) 5 MG tablet Take 5 mg by mouth daily.    . simvastatin (ZOCOR) 40 MG tablet Take 40 mg by mouth daily.    . traMADol (ULTRAM) 50 MG tablet Take 50-100 mg 2 (two) times daily as needed by mouth (knee pain).    Marland Kitchen zonisamide (ZONEGRAN) 25 MG capsule Take 1 capsule by mouth 2 (two) times daily.   1   No current facility-administered medications for this visit.     Physical Exam: Vitals:   10/29/17 1350  BP: 112/60  Pulse: (!) 52  SpO2: 98%  Weight: 165 lb (74.8 kg)  Height: 5\' 10"  (1.778 m)    GEN- The patient is well appearing, alert and oriented x 3 today.   Head- normocephalic, atraumatic Eyes-  Sclera clear, conjunctiva pink Ears- hearing intact Oropharynx- clear Lungs- Clear to ausculation bilaterally, normal work of breathing Heart- Regular rate and rhythm, no murmurs, rubs or gallops, PMI not laterally displaced GI- soft, NT, ND, + BS Extremities- no clubbing, cyanosis, or edema  EKG tracing ordered today is personally reviewed and shows sinus bradycardia 52 bpm, PACs, otherwise normal ekg  Assessment and Plan:  1. Paroxysmal atrial fibrillation On eliqius He continues to have episodes with fatigue and decreased exercise  tolerance.  Given sinus bradycardia, he has very few AAD options.  I would therefore advise ablation as our approach.  I am hopeful that his heart rates may actually improve post ablation. Therapeutic strategies for afib including medicine and ablation were discussed in detail with the patient today. Risk, benefits, and alternatives to EP study and radiofrequency ablation for afib were also discussed in detail today. These risks include but are not limited to stroke, bleeding, vascular damage, tamponade, perforation, damage to the esophagus, lungs, and other structures, pulmonary vein stenosis, worsening renal function, and death. The patient understands these risk and wishes to  proceed.  We will therefore proceed with catheter ablation at the next available time.  Continue anticoagulation with eliquis without interruption in th interim.  Will obtain cardiac CT prior to ablation to exclude LAA thrombus.  2. Sinus bradycardia Recent stress testing reveals that he was able to achieve heart rate of 131 bpm with exercise.  myoview was low risk Hopefully his sinus rate will improve with ablation.  Thompson Grayer MD, [facility] 10/29/2017 2:04 PM

## 2017-10-29 NOTE — Patient Instructions (Addendum)
Medication Instructions:  Your physician recommends that you continue on your current medications as directed. Please refer to the Current Medication list given to you today.  -- If you need a refill on your cardiac medications before your next appointment, please call your pharmacy. --  Labwork: Your physician recommends that you return for lab work on 11/02/17: BMP/CBC---you do not have to fast  Testing/Procedures: Your physician has requested that you have cardiac CT. Cardiac computed tomography (CT) is a painless test that uses an x-ray machine to take clear, detailed pictures of your heart. For further information please visit HugeFiesta.[email or web address]. Please follow instruction sheet as given.---need week of 11/05/17 prior to ablation on 11/16/17  Your physician has recommended that you have an ablation. Catheter ablation is a medical procedure used to treat some cardiac arrhythmias (irregular heartbeats). During catheter ablation, a long, thin, flexible tube is put into a blood vessel in your groin (upper thigh), or neck. This tube is called an ablation catheter. It is then guided to your heart through the blood vessel. Radio frequency waves destroy small areas of heart tissue where abnormal heartbeats may cause an arrhythmia to start. Please see the instruction sheet given to you today.---11/16/17  Please arrive at [facility] at 5:30am Do not eat or drink after midnight the night prior to the procedure Make sure not to miss any doses of your ELIQUIS prior to the procedure Do not take any medications the morning of the test Plan for one night stay Will need someone to drive you home at discharge        Follow-Up: Your physician wants you to follow-up in: 4 weeks from 11/16/17 with Roderic Palau, NP in [place] clinic and 3 months from 11/16/17 with Dr Rayann Heman   Thank you for choosing [facility]!!   Frederik Schmidt, RN [phone]  Any Other  Special Instructions Will Be Listed Below (If Applicable).

## 2017-10-30 NOTE — Addendum Note (Signed)
Addended by: Frederik Schmidt on: 10/30/2017 12:33 PM   Modules accepted: Orders

## 2017-11-02 ENCOUNTER — Other Ambulatory Visit: Payer: PPO | Admitting: *Deleted

## 2017-11-02 DIAGNOSIS — I480 Paroxysmal atrial fibrillation: Secondary | ICD-10-CM | POA: Diagnosis not present

## 2017-11-02 LAB — CBC WITH DIFFERENTIAL/PLATELET
BASOS: 1 %
Basophils Absolute: 0.1 10*3/uL (ref 0.0–0.2)
EOS (ABSOLUTE): 0.2 10*3/uL (ref 0.0–0.4)
EOS: 6 %
HEMATOCRIT: 45 % (ref 37.5–51.0)
HEMOGLOBIN: 15 g/dL (ref 13.0–17.7)
IMMATURE GRANS (ABS): 0 10*3/uL (ref 0.0–0.1)
Immature Granulocytes: 0 %
Lymphocytes Absolute: 1.4 10*3/uL (ref 0.7–3.1)
Lymphs: 34 %
MCH: 31.8 pg (ref 26.6–33.0)
MCHC: 33.3 g/dL (ref 31.5–35.7)
MCV: 95 fL (ref 79–97)
MONOCYTES: 9 %
MONOS ABS: 0.4 10*3/uL (ref 0.1–0.9)
NEUTROS PCT: 50 %
Neutrophils Absolute: 2 10*3/uL (ref 1.4–7.0)
Platelets: 215 10*3/uL (ref 150–379)
RBC: 4.72 x10E6/uL (ref 4.14–5.80)
RDW: 14 % (ref 12.3–15.4)
WBC: 4.1 10*3/uL (ref 3.4–10.8)

## 2017-11-02 LAB — BASIC METABOLIC PANEL
BUN/Creatinine Ratio: 27 — ABNORMAL HIGH (ref 10–24)
BUN: 26 mg/dL (ref 8–27)
CO2: 25 mmol/L (ref 20–29)
CREATININE: 0.97 mg/dL (ref 0.76–1.27)
Calcium: 9.9 mg/dL (ref 8.6–10.2)
Chloride: 108 mmol/L — ABNORMAL HIGH (ref 96–106)
GFR calc Af Amer: 91 mL/min/{1.73_m2} (ref >59)
GFR, EST NON AFRICAN AMERICAN: 79 mL/min/{1.73_m2} (ref >59)
Glucose: 97 mg/dL (ref 65–99)
Potassium: 4.7 mmol/L (ref 3.5–5.2)
SODIUM: 144 mmol/L (ref 134–144)

## 2017-11-07 ENCOUNTER — Telehealth: Payer: Self-pay

## 2017-11-07 NOTE — Telephone Encounter (Signed)
Patient would like a Rx refill for Tramadol.  Cb# is [phone].  Please advise.  Thank You.

## 2017-11-08 DIAGNOSIS — G8929 Other chronic pain: Secondary | ICD-10-CM | POA: Diagnosis not present

## 2017-11-08 DIAGNOSIS — Z96651 Presence of right artificial knee joint: Secondary | ICD-10-CM | POA: Diagnosis not present

## 2017-11-08 DIAGNOSIS — Z79899 Other long term (current) drug therapy: Secondary | ICD-10-CM | POA: Diagnosis not present

## 2017-11-08 DIAGNOSIS — M1711 Unilateral primary osteoarthritis, right knee: Secondary | ICD-10-CM | POA: Diagnosis not present

## 2017-11-08 DIAGNOSIS — Z7901 Long term (current) use of anticoagulants: Secondary | ICD-10-CM | POA: Diagnosis not present

## 2017-11-08 DIAGNOSIS — M25561 Pain in right knee: Secondary | ICD-10-CM | POA: Diagnosis not present

## 2017-11-08 NOTE — Telephone Encounter (Signed)
yes

## 2017-11-08 NOTE — Telephone Encounter (Signed)
OK to do-

## 2017-11-09 ENCOUNTER — Ambulatory Visit (HOSPITAL_COMMUNITY)
Admission: RE | Admit: 2017-11-09 | Discharge: 2017-11-09 | Disposition: A | Discharge status: Home / Self Care | Payer: PPO | Source: Ambulatory Visit | Attending: Internal Medicine | Admitting: Internal Medicine

## 2017-11-09 ENCOUNTER — Ambulatory Visit (HOSPITAL_COMMUNITY): Payer: PPO

## 2017-11-09 DIAGNOSIS — I517 Cardiomegaly: Secondary | ICD-10-CM | POA: Insufficient documentation

## 2017-11-09 DIAGNOSIS — M4804 Spinal stenosis, thoracic region: Secondary | ICD-10-CM | POA: Insufficient documentation

## 2017-11-09 DIAGNOSIS — I4891 Unspecified atrial fibrillation: Secondary | ICD-10-CM | POA: Diagnosis not present

## 2017-11-09 DIAGNOSIS — I480 Paroxysmal atrial fibrillation: Secondary | ICD-10-CM | POA: Insufficient documentation

## 2017-11-09 MED ORDER — NITROGLYCERIN 0.4 MG SL SUBL
SUBLINGUAL_TABLET | SUBLINGUAL | Status: AC
  Administered 2017-11-09: 0.4 mg via SUBLINGUAL
  Filled 2017-11-09: qty 1

## 2017-11-09 MED ORDER — NITROGLYCERIN 0.4 MG SL SUBL
0.4000 mg | SUBLINGUAL_TABLET | SUBLINGUAL | Status: DC | PRN
  Administered 2017-11-09: 0.4 mg via SUBLINGUAL
  Filled 2017-11-09: qty 25

## 2017-11-09 MED ORDER — IOPAMIDOL (ISOVUE-370) INJECTION 76%
INTRAVENOUS | Status: AC
  Filled 2017-11-09: qty 100

## 2017-11-12 ENCOUNTER — Other Ambulatory Visit (INDEPENDENT_AMBULATORY_CARE_PROVIDER_SITE_OTHER): Payer: Self-pay

## 2017-11-12 MED ORDER — TRAMADOL HCL 50 MG PO TABS: 50.0000 mg | ORAL_TABLET | Freq: Two times a day (BID) | ORAL | 0 refills | Status: DC | PRN

## 2017-11-12 NOTE — Telephone Encounter (Signed)
Sent to pharmacy 

## 2017-11-16 ENCOUNTER — Other Ambulatory Visit: Payer: Self-pay

## 2017-11-16 ENCOUNTER — Encounter (HOSPITAL_COMMUNITY): Payer: Self-pay | Admitting: Anesthesiology

## 2017-11-16 ENCOUNTER — Ambulatory Visit (HOSPITAL_COMMUNITY): Payer: PPO | Admitting: Anesthesiology

## 2017-11-16 ENCOUNTER — Ambulatory Visit (HOSPITAL_COMMUNITY)
Admission: RE | Admit: 2017-11-16 | Discharge: 2017-11-16 | Disposition: A | Discharge status: Home / Self Care | Payer: PPO | Source: Ambulatory Visit | Attending: Internal Medicine | Admitting: Internal Medicine

## 2017-11-16 ENCOUNTER — Other Ambulatory Visit: Payer: Self-pay | Admitting: Internal Medicine

## 2017-11-16 ENCOUNTER — Encounter (HOSPITAL_COMMUNITY)
Admission: RE | Disposition: A | Discharge status: Home / Self Care | Payer: Self-pay | Source: Ambulatory Visit | Attending: Internal Medicine

## 2017-11-16 DIAGNOSIS — M1990 Unspecified osteoarthritis, unspecified site: Secondary | ICD-10-CM | POA: Diagnosis not present

## 2017-11-16 DIAGNOSIS — Z7951 Long term (current) use of inhaled steroids: Secondary | ICD-10-CM | POA: Diagnosis not present

## 2017-11-16 DIAGNOSIS — Z7901 Long term (current) use of anticoagulants: Secondary | ICD-10-CM | POA: Diagnosis not present

## 2017-11-16 DIAGNOSIS — E785 Hyperlipidemia, unspecified: Secondary | ICD-10-CM | POA: Insufficient documentation

## 2017-11-16 DIAGNOSIS — I10 Essential (primary) hypertension: Secondary | ICD-10-CM | POA: Diagnosis not present

## 2017-11-16 DIAGNOSIS — I480 Paroxysmal atrial fibrillation: Secondary | ICD-10-CM | POA: Diagnosis not present

## 2017-11-16 DIAGNOSIS — I4891 Unspecified atrial fibrillation: Secondary | ICD-10-CM | POA: Diagnosis not present

## 2017-11-16 HISTORY — PX: ATRIAL FIBRILLATION ABLATION: EP1191

## 2017-11-16 HISTORY — DX: Headache, unspecified: R51.9

## 2017-11-16 HISTORY — DX: Headache: R51

## 2017-11-16 LAB — POCT ACTIVATED CLOTTING TIME
ACTIVATED CLOTTING TIME: 329 s
Activated Clotting Time: 279 seconds
Activated Clotting Time: 186 seconds

## 2017-11-16 SURGERY — ATRIAL FIBRILLATION ABLATION
Anesthesia: Monitor Anesthesia Care

## 2017-11-16 MED ORDER — BUPIVACAINE HCL (PF) 0.25 % IJ SOLN
INTRAMUSCULAR | Status: DC | PRN
  Administered 2017-11-16: 20 mL

## 2017-11-16 MED ORDER — PROPOFOL 10 MG/ML IV BOLUS
INTRAVENOUS | Status: DC | PRN
  Administered 2017-11-16 (×2): 20 mg via INTRAVENOUS

## 2017-11-16 MED ORDER — PANTOPRAZOLE SODIUM 40 MG PO TBEC: 40.0000 mg | DELAYED_RELEASE_TABLET | Freq: Every day | ORAL | 0 refills | Status: DC

## 2017-11-16 MED ORDER — LISINOPRIL 5 MG PO TABS: 5.0000 mg | ORAL_TABLET | Freq: Every day | ORAL | Status: DC

## 2017-11-16 MED ORDER — ONDANSETRON HCL 4 MG/2ML IJ SOLN
INTRAMUSCULAR | Status: DC | PRN
  Administered 2017-11-16: 4 mg via INTRAVENOUS

## 2017-11-16 MED ORDER — TRAMADOL HCL 50 MG PO TABS: 50.0000 mg | ORAL_TABLET | Freq: Two times a day (BID) | ORAL | Status: DC | PRN

## 2017-11-16 MED ORDER — HYDROCODONE-ACETAMINOPHEN 5-325 MG PO TABS: 1.0000 | ORAL_TABLET | ORAL | Status: DC | PRN

## 2017-11-16 MED ORDER — HEPARIN SODIUM (PORCINE) 1000 UNIT/ML IJ SOLN
INTRAMUSCULAR | Status: AC
  Filled 2017-11-16: qty 1

## 2017-11-16 MED ORDER — ONDANSETRON HCL 4 MG/2ML IJ SOLN: 4.0000 mg | Freq: Four times a day (QID) | INTRAMUSCULAR | Status: DC | PRN

## 2017-11-16 MED ORDER — ISOPROTERENOL HCL 0.2 MG/ML IJ SOLN
INTRAVENOUS | Status: DC | PRN
  Administered 2017-11-16: 20 ug/min via INTRAVENOUS

## 2017-11-16 MED ORDER — IOPAMIDOL (ISOVUE-370) INJECTION 76%
INTRAVENOUS | Status: DC | PRN
  Administered 2017-11-16: 3 mL via INTRAVENOUS

## 2017-11-16 MED ORDER — PROPOFOL 500 MG/50ML IV EMUL
INTRAVENOUS | Status: DC | PRN
  Administered 2017-11-16: 75 ug/kg/min via INTRAVENOUS

## 2017-11-16 MED ORDER — SODIUM CHLORIDE 0.9 % IV SOLN: 250.0000 mL | INTRAVENOUS | Status: DC | PRN

## 2017-11-16 MED ORDER — HEPARIN SODIUM (PORCINE) 1000 UNIT/ML IJ SOLN
INTRAMUSCULAR | Status: DC | PRN
  Administered 2017-11-16: 1000 [IU] via INTRAVENOUS

## 2017-11-16 MED ORDER — FENTANYL CITRATE (PF) 100 MCG/2ML IJ SOLN
INTRAMUSCULAR | Status: DC | PRN
  Administered 2017-11-16 (×2): 50 ug via INTRAVENOUS

## 2017-11-16 MED ORDER — ACETAMINOPHEN 325 MG PO TABS: 650.0000 mg | ORAL_TABLET | ORAL | Status: DC | PRN

## 2017-11-16 MED ORDER — SODIUM CHLORIDE 0.9% FLUSH: 3.0000 mL | INTRAVENOUS | Status: DC | PRN

## 2017-11-16 MED ORDER — PHENYLEPHRINE HCL 10 MG/ML IJ SOLN
INTRAMUSCULAR | Status: DC | PRN
  Administered 2017-11-16 (×2): 120 ug via INTRAVENOUS
  Administered 2017-11-16 (×2): 80 ug via INTRAVENOUS

## 2017-11-16 MED ORDER — IOPAMIDOL (ISOVUE-370) INJECTION 76%
INTRAVENOUS | Status: AC
  Filled 2017-11-16: qty 50

## 2017-11-16 MED ORDER — HEPARIN SODIUM (PORCINE) 1000 UNIT/ML IJ SOLN
INTRAMUSCULAR | Status: DC | PRN
  Administered 2017-11-16: 5000 [IU] via INTRAVENOUS
  Administered 2017-11-16: 12000 [IU] via INTRAVENOUS
  Administered 2017-11-16: 1000 [IU] via INTRAVENOUS

## 2017-11-16 MED ORDER — SODIUM CHLORIDE 0.9 % IV SOLN
INTRAVENOUS | Status: DC
  Administered 2017-11-16: 06:00:00 via INTRAVENOUS

## 2017-11-16 MED ORDER — SODIUM CHLORIDE 0.9% FLUSH: 3.0000 mL | Freq: Two times a day (BID) | INTRAVENOUS | Status: DC

## 2017-11-16 MED ORDER — ZONISAMIDE 25 MG PO CAPS
50.0000 mg | ORAL_CAPSULE | Freq: Every day | ORAL | Status: DC
  Filled 2017-11-16: qty 2

## 2017-11-16 MED ORDER — APIXABAN 5 MG PO TABS
5.0000 mg | ORAL_TABLET | Freq: Two times a day (BID) | ORAL | Status: DC
  Administered 2017-11-16: 13:00:00 5 mg via ORAL
  Filled 2017-11-16: qty 1

## 2017-11-16 MED ORDER — PROTAMINE SULFATE 10 MG/ML IV SOLN
INTRAVENOUS | Status: DC | PRN
  Administered 2017-11-16: 30 mg via INTRAVENOUS

## 2017-11-16 MED ORDER — MIDAZOLAM HCL 5 MG/5ML IJ SOLN
INTRAMUSCULAR | Status: DC | PRN
  Administered 2017-11-16 (×2): 1 mg via INTRAVENOUS

## 2017-11-16 MED ORDER — BUPIVACAINE HCL (PF) 0.25 % IJ SOLN
INTRAMUSCULAR | Status: AC
  Filled 2017-11-16: qty 30

## 2017-11-16 MED ORDER — HEPARIN (PORCINE) IN NACL 2-0.9 UNIT/ML-% IJ SOLN
INTRAMUSCULAR | Status: AC | PRN
  Administered 2017-11-16: 500 mL

## 2017-11-16 MED ORDER — LIDOCAINE HCL (CARDIAC) 20 MG/ML IV SOLN
INTRAVENOUS | Status: DC | PRN
  Administered 2017-11-16: 40 mg via INTRAVENOUS

## 2017-11-16 MED ORDER — ISOPROTERENOL HCL 0.2 MG/ML IJ SOLN
INTRAMUSCULAR | Status: AC
  Filled 2017-11-16: qty 5

## 2017-11-16 SURGICAL SUPPLY — 18 items
BLANKET WARM UNDERBOD FULL ACC (MISCELLANEOUS) ×2 IMPLANT
CATH MAPPNG PENTARAY F 2-6-2MM (CATHETERS) IMPLANT
CATH NAVISTAR SMARTTOUCH DF (ABLATOR) ×1 IMPLANT
CATH SOUNDSTAR ECO REPROCESSED (CATHETERS) ×1 IMPLANT
CATH WEBSTER BI DIR CS D-F CRV (CATHETERS) ×1 IMPLANT
COVER SWIFTLINK CONNECTOR (BAG) ×2 IMPLANT
NDL TRANSEP BRK 71CM 407200 (NEEDLE) IMPLANT
NEEDLE TRANSEP BRK 71CM 407200 (NEEDLE) ×2 IMPLANT
PACK EP LATEX FREE (CUSTOM PROCEDURE TRAY) ×2
PACK EP LF (CUSTOM PROCEDURE TRAY) ×1 IMPLANT
PAD DEFIB LIFELINK (PAD) ×2 IMPLANT
PATCH CARTO3 (PAD) ×1 IMPLANT
PENTARAY F 2-6-2MM (CATHETERS) ×2
SHEATH AVANTI 11F 11CM (SHEATH) ×1 IMPLANT
SHEATH PINNACLE 7F 10CM (SHEATH) ×2 IMPLANT
SHEATH PINNACLE 9F 10CM (SHEATH) ×1 IMPLANT
SHEATH SWARTZ TS SL2 63CM 8.5F (SHEATH) ×1 IMPLANT
TUBING SMART ABLATE COOLFLOW (TUBING) ×1 IMPLANT

## 2017-11-16 NOTE — Progress Notes (Signed)
Doing well s/p AF ablation VSS Denies any complaints Groin is without hematoma  DC to home Continue current medicines He should take eliquis tonight at bedtime Add protonix 40mg  daily x 6 weeks  Follow-up in AF clinic in 4 weeks I will see in 3 months He should contact AF clinic with any concerns  Thompson Grayer MD, [facility] 11/16/2017 1:40 PM

## 2017-11-16 NOTE — Interval H&P Note (Signed)
History and Physical Interval Note:  11/16/2017 7:20 AM  Alan Knight.  has presented today for surgery, with the diagnosis of afib  The various methods of treatment have been discussed with the patient and family. After consideration of risks, benefits and other options for treatment, the patient has consented to  Procedure(s): ATRIAL FIBRILLATION ABLATION (N/A) as a surgical intervention .  The patient's history has been reviewed, patient examined, no change in status, stable for surgery.  I have reviewed the patient's chart and labs.  Questions were answered to the patient's satisfaction.     Thompson Grayer

## 2017-11-16 NOTE — Progress Notes (Signed)
Discharge instructions reviewed with the patient to include medications, prescriptions, activity and follow up MD visits. Market researcher provided.  Patient voices understanding to teaching.  To door via wheelchair.  Home via [place] with his wife driving.

## 2017-11-16 NOTE — Transfer of Care (Signed)
Immediate Anesthesia Transfer of Care Note  Patient: Alan Knight.  Procedure(s) Performed: ATRIAL FIBRILLATION ABLATION (N/A )  Patient Location: Cath Lab  Anesthesia Type:MAC  Level of Consciousness: awake, alert , oriented and patient cooperative  Airway & Oxygen Therapy: Patient Spontanous Breathing and Patient connected to nasal cannula oxygen  Post-op Assessment: Report given to RN and Post -op Vital signs reviewed and stable  Post vital signs: Reviewed and stable  Last Vitals:  Vitals:   11/16/17 1102 11/16/17 1130  BP: 101/61 126/66  Pulse:  (!) 45  Resp: 14 16  Temp:  36.6 C  SpO2: 99% 100%    Last Pain:  Vitals:   11/16/17 1130  TempSrc: Oral         Complications: No apparent anesthesia complications

## 2017-11-16 NOTE — Discharge Instructions (Signed)
No driving for 4 days. No lifting over 5 lbs for 1 week. No vigorous or sexual activity for 1 week. You may return to work on 11/23/17. Keep procedure site clean & dry. If you notice increased pain, swelling, bleeding or pus, call/return!  You may shower, but no soaking baths/hot tubs/pools for 1 week.   You have an appointment set up with the [facility].  Multiple studies have shown that being followed by a dedicated atrial fibrillation clinic in addition to the standard care you receive from your other physicians improves health. We believe that enrollment in the atrial fibrillation clinic will allow [place] to better care for you.   The phone number to the [facility] is [phone]. The clinic is staffed Monday through Friday from 8:30am to 5pm.  Parking Directions: The clinic is located in the Heart and Vascular Building connected to [facility]. 1)From [address] turn on to [address] and go to the 3rd entrance  (Heart and Vascular entrance) on the right. 2)Look to the right for Heart &Vascular Parking Garage. 3)A code for the entrance is required please call the clinic to receive this.   4)Take the elevators to the 1st floor. Registration is in the room with the glass walls at the end of the hallway.  If you have any trouble parking or locating the clinic, please dont hesitate to call [phone].

## 2017-11-16 NOTE — Progress Notes (Addendum)
Site area: RFV x 3 Site Prior to Removal:  Level 0 Pressure Applied For: 1100 till 1700 Manual:  yes  Patient Status During Pull: stable   Post Pull Site:  Level 0 Post Pull Instructions Given: yes  Post Pull Pulses Present: palpable Dressing Applied:  tegaderm Bedrest begins @ 1100 till 1700 Comments:

## 2017-11-16 NOTE — Progress Notes (Signed)
Patient ambulated in hall approximately 120 feet.  Gait steady.  Right Groin stable and remains at a level 0.  Patient tolerated procedure well.

## 2017-11-16 NOTE — Anesthesia Preprocedure Evaluation (Signed)
Anesthesia Evaluation  Patient identified by MRN, date of birth, ID band Patient awake    Reviewed: Allergy & Precautions, NPO status , Patient's Chart, lab work & pertinent test results  Airway Mallampati: II  TM Distance: >3 FB Neck ROM: Full    Dental  (+) Teeth Intact, Dental Advisory Given   Pulmonary    breath sounds clear to auscultation       Cardiovascular hypertension,  Rhythm:Regular Rate:Normal     Neuro/Psych    GI/Hepatic   Endo/Other    Renal/GU      Musculoskeletal   Abdominal   Peds  Hematology   Anesthesia Other Findings   Reproductive/Obstetrics                             Anesthesia Physical Anesthesia Plan  ASA: III  Anesthesia Plan: MAC   Post-op Pain Management:    Induction: Intravenous  PONV Risk Score and Plan: Ondansetron and Dexamethasone  Airway Management Planned: Natural Airway and Simple Face Mask  Additional Equipment:   Intra-op Plan:   Post-operative Plan:   Informed Consent: I have reviewed the patients History and Physical, chart, labs and discussed the procedure including the risks, benefits and alternatives for the proposed anesthesia with the patient or authorized representative who has indicated his/her understanding and acceptance.   Dental advisory given  Plan Discussed with: CRNA and Anesthesiologist  Anesthesia Plan Comments:         Anesthesia Quick Evaluation

## 2017-11-16 NOTE — Anesthesia Postprocedure Evaluation (Signed)
Anesthesia Post Note  Patient: Alan Knight.  Procedure(s) Performed: ATRIAL FIBRILLATION ABLATION (N/A )     Patient location during evaluation: Cath Lab Anesthesia Type: MAC Level of consciousness: awake, awake and alert and oriented Pain management: pain level controlled Vital Signs Assessment: post-procedure vital signs reviewed and stable Respiratory status: spontaneous breathing, nonlabored ventilation and respiratory function stable Cardiovascular status: blood pressure returned to baseline Postop Assessment: no headache Anesthetic complications: no    Last Vitals:  Vitals:   11/16/17 1430 11/16/17 1500  BP: 135/75 (!) 111/59  Pulse: (!) 47 (!) 51  Resp: 17 14  Temp:    SpO2: 100% 99%    Last Pain:  Vitals:   11/16/17 1130  TempSrc: Oral                 Lashika Erker COKER

## 2017-11-19 ENCOUNTER — Telehealth: Payer: Self-pay | Admitting: Internal Medicine

## 2017-11-19 DIAGNOSIS — M25562 Pain in left knee: Secondary | ICD-10-CM | POA: Diagnosis not present

## 2017-11-19 DIAGNOSIS — Z96652 Presence of left artificial knee joint: Secondary | ICD-10-CM | POA: Diagnosis not present

## 2017-11-19 DIAGNOSIS — G8929 Other chronic pain: Secondary | ICD-10-CM | POA: Diagnosis not present

## 2017-11-19 DIAGNOSIS — Z96651 Presence of right artificial knee joint: Secondary | ICD-10-CM | POA: Diagnosis not present

## 2017-11-19 DIAGNOSIS — M25561 Pain in right knee: Secondary | ICD-10-CM | POA: Diagnosis not present

## 2017-11-19 DIAGNOSIS — G894 Chronic pain syndrome: Secondary | ICD-10-CM | POA: Diagnosis not present

## 2017-11-19 NOTE — Telephone Encounter (Signed)
I spoke with pt and explained to him why he was on protonix.

## 2017-11-19 NOTE — Telephone Encounter (Signed)
New Message     Pt c/o medication issue:  1. Name of Medication: prontonix  2. How are you currently taking this medication (dosage and times per day)? Not taking it   3. Are you having a reaction (difficulty breathing--STAT)? no  4. What is your medication issue?  Was given this prescription when he was discharged what is it for ?

## 2017-11-26 ENCOUNTER — Ambulatory Visit: Payer: PPO | Admitting: Internal Medicine

## 2017-12-07 ENCOUNTER — Other Ambulatory Visit (INDEPENDENT_AMBULATORY_CARE_PROVIDER_SITE_OTHER): Payer: Self-pay | Admitting: *Deleted

## 2017-12-07 ENCOUNTER — Telehealth: Payer: Self-pay

## 2017-12-07 MED ORDER — TRAMADOL HCL 50 MG PO TABS: 50.0000 mg | ORAL_TABLET | Freq: Two times a day (BID) | ORAL | 0 refills | Status: DC | PRN

## 2017-12-07 NOTE — Telephone Encounter (Signed)
I called Tramadol into [facility] and I called patient.

## 2017-12-07 NOTE — Telephone Encounter (Signed)
Please advise 

## 2017-12-07 NOTE — Telephone Encounter (Signed)
ok 

## 2017-12-07 NOTE — Telephone Encounter (Signed)
Patient would like a Rx refill on Tramadol.  Pharmacy is [facility] on [address].  Cb# [phone].  Please advise.  Thank you.

## 2017-12-10 DIAGNOSIS — L570 Actinic keratosis: Secondary | ICD-10-CM | POA: Diagnosis not present

## 2017-12-10 DIAGNOSIS — L821 Other seborrheic keratosis: Secondary | ICD-10-CM | POA: Diagnosis not present

## 2017-12-10 DIAGNOSIS — Z85828 Personal history of other malignant neoplasm of skin: Secondary | ICD-10-CM | POA: Diagnosis not present

## 2017-12-12 ENCOUNTER — Ambulatory Visit (HOSPITAL_COMMUNITY)
Admission: RE | Admit: 2017-12-12 | Discharge: 2017-12-12 | Disposition: A | Discharge status: Home / Self Care | Payer: PPO | Source: Ambulatory Visit | Attending: Nurse Practitioner | Admitting: Nurse Practitioner

## 2017-12-12 VITALS — BP 122/76 | HR 60 | Ht 70.0 in | Wt 165.0 lb

## 2017-12-12 DIAGNOSIS — M1990 Unspecified osteoarthritis, unspecified site: Secondary | ICD-10-CM | POA: Diagnosis not present

## 2017-12-12 DIAGNOSIS — I491 Atrial premature depolarization: Secondary | ICD-10-CM | POA: Insufficient documentation

## 2017-12-12 DIAGNOSIS — I10 Essential (primary) hypertension: Secondary | ICD-10-CM | POA: Diagnosis not present

## 2017-12-12 DIAGNOSIS — Z79899 Other long term (current) drug therapy: Secondary | ICD-10-CM | POA: Diagnosis not present

## 2017-12-12 DIAGNOSIS — I480 Paroxysmal atrial fibrillation: Secondary | ICD-10-CM | POA: Insufficient documentation

## 2017-12-12 DIAGNOSIS — Z7901 Long term (current) use of anticoagulants: Secondary | ICD-10-CM | POA: Insufficient documentation

## 2017-12-12 DIAGNOSIS — Z96653 Presence of artificial knee joint, bilateral: Secondary | ICD-10-CM | POA: Diagnosis not present

## 2017-12-12 DIAGNOSIS — E785 Hyperlipidemia, unspecified: Secondary | ICD-10-CM | POA: Diagnosis not present

## 2017-12-12 DIAGNOSIS — Z96649 Presence of unspecified artificial hip joint: Secondary | ICD-10-CM | POA: Insufficient documentation

## 2017-12-12 DIAGNOSIS — Z9889 Other specified postprocedural states: Secondary | ICD-10-CM | POA: Diagnosis not present

## 2017-12-12 DIAGNOSIS — Z85828 Personal history of other malignant neoplasm of skin: Secondary | ICD-10-CM | POA: Diagnosis not present

## 2017-12-12 NOTE — Progress Notes (Signed)
Primary Care Physician: Reynold Bowen, MD Referring Physician:Dr. Maudry Diego Dare Sanger. is a 71 y.o. male with a h/o paroxysmal afib that is in the afib clinic for f/u abation performed 11/23. He has noted very minor episodes of afib since procedure and overall has felt better. No swallowing or groin issues. Continues on eliquis without missed doses.  Today, he denies symptoms of palpitations, chest pain, shortness of breath, orthopnea, PND, lower extremity edema, dizziness, presyncope, syncope, or neurologic sequela. The patient is tolerating medications without difficulties and is otherwise without complaint today.   Past Medical History:  Diagnosis Date  . Arthritis   . Basal cell carcinoma   . BPH (benign prostatic hypertrophy) with urinary obstruction   . Diverticulosis   . Headache   . Hyperlipidemia   . Hypertension   . Hypogonadism male   . Internal hemorrhoids   . OA (osteoarthritis)   . Paroxysmal atrial fibrillation [facility])    Past Surgical History:  Procedure Laterality Date  . ATRIAL FIBRILLATION ABLATION N/A 11/16/2017   Procedure: ATRIAL FIBRILLATION ABLATION;  Surgeon: Thompson Grayer, MD;  Location: [facility];  Service: Cardiovascular;  Laterality: N/A;  . CARPAL TUNNEL RELEASE     left  . HIP ARTHROPLASTY    . JOINT REPLACEMENT    . KNEE ARTHROPLASTY     right  . KNEE SURGERY    . SHOULDER SURGERY     bilateral  . TOTAL KNEE ARTHROPLASTY     left  . TOTAL KNEE ARTHROPLASTY Left 11/09/2015   Procedure: TOTAL KNEE ARTHROPLASTY;  Surgeon: Garald Balding, MD;  Location: [place];  Service: Orthopedics;  Laterality: Left;    Current Outpatient Medications  Medication Sig Dispense Refill  . acetaminophen (TYLENOL) 325 MG tablet Take 650 mg every 6 (six) hours as needed by mouth for moderate pain or headache.    Marland Kitchen apixaban (ELIQUIS) 5 MG TABS tablet Take 1 tablet (5 mg total) by mouth 2 (two) times daily. 60 tablet 6  . Ascorbic Acid (VITAMIN C)  1000 MG tablet Take 1,000 mg by mouth daily.    . calcium acetate (PHOSLO) 667 MG capsule Take 500 mg by mouth daily.    . Cholecalciferol (VITAMIN D3) 5000 UNITS CAPS Take 5,000 Units daily by mouth.     . co-enzyme Q-10 30 MG capsule Take 300 mg by mouth daily.    Marland Kitchen glucosamine-chondroitin 500-400 MG tablet Take 1 tablet by mouth daily.    Marland Kitchen lisinopril (PRINIVIL,ZESTRIL) 5 MG tablet Take 5 mg by mouth daily.    . Multiple Minerals-Vitamins (CAL-MAG-ZINC-D) TABS Take 1 tablet daily by mouth.    . naproxen (NAPROSYN) 500 MG tablet Take 500 mg by mouth 2 (two) times a week.    . pantoprazole (PROTONIX) 40 MG tablet TAKE 1 TABLET(40 MG) BY MOUTH DAILY 90 tablet 3  . simvastatin (ZOCOR) 40 MG tablet Take 40 mg by mouth daily.    . traMADol (ULTRAM) 50 MG tablet Take 1 tablet (50 mg total) by mouth every 12 (twelve) hours as needed. 30 tablet 0  . zonisamide (ZONEGRAN) 50 MG capsule Take 50 mg at bedtime by mouth.   1   No current facility-administered medications for this encounter.     No Known Allergies  Social History   Socioeconomic History  . Marital status: Married    Spouse name: Not on file  . Number of children: Not on file  . Years of education: Not on  file  . Highest education level: Not on file  Social Needs  . Financial resource strain: Not on file  . Food insecurity - worry: Not on file  . Food insecurity - inability: Not on file  . Transportation needs - medical: Not on file  . Transportation needs - non-medical: Not on file  Occupational History  . Occupation: Sales  Tobacco Use  . Smoking status: Never Smoker  . Smokeless tobacco: Never Used  Substance and Sexual Activity  . Alcohol use: Yes    Comment: 1 mixed drink per day  . Drug use: No  . Sexual activity: Not on file  Other Topics Concern  . Not on file  Social History Narrative   Lives in [place]   retired    Family History  Problem Relation Age of Onset  . Heart attack Father   . CVA Brother      ROS- All systems are reviewed and negative except as per the HPI above  Physical Exam: Vitals:   12/12/17 1040  BP: 122/76  Pulse: 60  Weight: 165 lb (74.8 kg)  Height: 5\' 10"  (1.778 m)   Wt Readings from Last 3 Encounters:  12/12/17 165 lb (74.8 kg)  11/16/17 160 lb 0.9 oz (72.6 kg)  10/29/17 165 lb (74.8 kg)    Labs: Lab Results  Component Value Date   NA 144 11/02/2017   K 4.7 11/02/2017   CL 108 (H) 11/02/2017   CO2 25 11/02/2017   GLUCOSE 97 11/02/2017   BUN 26 11/02/2017   CREATININE 0.97 11/02/2017   CALCIUM 9.9 11/02/2017   Lab Results  Component Value Date   INR 1.01 10/28/2015   No results found for: CHOL, HDL, LDLCALC, TRIG   GEN- The patient is well appearing, alert and oriented x 3 today.   Head- normocephalic, atraumatic Eyes-  Sclera clear, conjunctiva pink Ears- hearing intact Oropharynx- clear Neck- supple, no JVP Lymph- no cervical lymphadenopathy Lungs- Clear to ausculation bilaterally, normal work of breathing Heart- Regular rate and rhythm, no murmurs, rubs or gallops, PMI not laterally displaced GI- soft, NT, ND, + BS Extremities- no clubbing, cyanosis, or edema MS- no significant deformity or atrophy Skin- no rash or lesion Psych- euthymic mood, full affect Neuro- strength and sensation are intact  EKG-SR with PAC's, at 60 bpm, Pr int 142 ms, qrs int 86 ms, qtc 424 ms Epic records reviewed    Assessment and Plan: 1. Afib S/p ablation and staying in SR Continue eliquis 5 mg bid for a chadsvasc score of at least 3. Is aware not to stop drug x 3 months during healing period from ablation Has resumed his normal gym activities without symptoms  F/u with Dr. Rayann Heman 2/25  Geroge Baseman. Latesha Chesney, [facility] [address] [place], [place] [address] [phone]

## 2017-12-13 ENCOUNTER — Ambulatory Visit (HOSPITAL_COMMUNITY): Payer: PPO | Admitting: Nurse Practitioner

## 2017-12-13 DIAGNOSIS — R009 Unspecified abnormalities of heart beat: Secondary | ICD-10-CM | POA: Diagnosis not present

## 2017-12-13 DIAGNOSIS — G8929 Other chronic pain: Secondary | ICD-10-CM | POA: Diagnosis not present

## 2017-12-13 DIAGNOSIS — G894 Chronic pain syndrome: Secondary | ICD-10-CM | POA: Diagnosis not present

## 2017-12-13 DIAGNOSIS — Z79899 Other long term (current) drug therapy: Secondary | ICD-10-CM | POA: Diagnosis not present

## 2017-12-13 DIAGNOSIS — Z96651 Presence of right artificial knee joint: Secondary | ICD-10-CM | POA: Diagnosis not present

## 2017-12-13 DIAGNOSIS — Z7901 Long term (current) use of anticoagulants: Secondary | ICD-10-CM | POA: Diagnosis not present

## 2017-12-13 DIAGNOSIS — M25561 Pain in right knee: Secondary | ICD-10-CM | POA: Diagnosis not present

## 2017-12-13 DIAGNOSIS — M1711 Unilateral primary osteoarthritis, right knee: Secondary | ICD-10-CM | POA: Diagnosis not present

## 2017-12-27 DIAGNOSIS — M25561 Pain in right knee: Secondary | ICD-10-CM | POA: Diagnosis not present

## 2017-12-27 DIAGNOSIS — Z96651 Presence of right artificial knee joint: Secondary | ICD-10-CM | POA: Diagnosis not present

## 2017-12-27 DIAGNOSIS — G894 Chronic pain syndrome: Secondary | ICD-10-CM | POA: Diagnosis not present

## 2017-12-27 DIAGNOSIS — G8929 Other chronic pain: Secondary | ICD-10-CM | POA: Diagnosis not present

## 2017-12-27 DIAGNOSIS — M25562 Pain in left knee: Secondary | ICD-10-CM | POA: Diagnosis not present

## 2017-12-27 DIAGNOSIS — Z96652 Presence of left artificial knee joint: Secondary | ICD-10-CM | POA: Diagnosis not present

## 2017-12-31 ENCOUNTER — Other Ambulatory Visit (HOSPITAL_COMMUNITY): Payer: Self-pay | Admitting: *Deleted

## 2017-12-31 MED ORDER — APIXABAN 5 MG PO TABS: 5.0000 mg | ORAL_TABLET | Freq: Two times a day (BID) | ORAL | 1 refills | Status: DC

## 2018-01-09 DIAGNOSIS — M25561 Pain in right knee: Secondary | ICD-10-CM | POA: Diagnosis not present

## 2018-01-09 DIAGNOSIS — Z7901 Long term (current) use of anticoagulants: Secondary | ICD-10-CM | POA: Diagnosis not present

## 2018-01-09 DIAGNOSIS — Z791 Long term (current) use of non-steroidal anti-inflammatories (NSAID): Secondary | ICD-10-CM | POA: Diagnosis not present

## 2018-01-09 DIAGNOSIS — M25562 Pain in left knee: Secondary | ICD-10-CM | POA: Diagnosis not present

## 2018-01-09 DIAGNOSIS — M1712 Unilateral primary osteoarthritis, left knee: Secondary | ICD-10-CM | POA: Diagnosis not present

## 2018-01-09 DIAGNOSIS — Z79899 Other long term (current) drug therapy: Secondary | ICD-10-CM | POA: Diagnosis not present

## 2018-01-09 DIAGNOSIS — G894 Chronic pain syndrome: Secondary | ICD-10-CM | POA: Diagnosis not present

## 2018-01-09 DIAGNOSIS — Z96653 Presence of artificial knee joint, bilateral: Secondary | ICD-10-CM | POA: Diagnosis not present

## 2018-01-23 DIAGNOSIS — Z96652 Presence of left artificial knee joint: Secondary | ICD-10-CM | POA: Diagnosis not present

## 2018-01-23 DIAGNOSIS — R69 Illness, unspecified: Secondary | ICD-10-CM | POA: Diagnosis not present

## 2018-01-23 DIAGNOSIS — G894 Chronic pain syndrome: Secondary | ICD-10-CM | POA: Diagnosis not present

## 2018-01-23 DIAGNOSIS — M25562 Pain in left knee: Secondary | ICD-10-CM | POA: Diagnosis not present

## 2018-01-23 DIAGNOSIS — Z96651 Presence of right artificial knee joint: Secondary | ICD-10-CM | POA: Diagnosis not present

## 2018-01-23 DIAGNOSIS — M25561 Pain in right knee: Secondary | ICD-10-CM | POA: Diagnosis not present

## 2018-01-23 DIAGNOSIS — G8929 Other chronic pain: Secondary | ICD-10-CM | POA: Diagnosis not present

## 2018-02-11 DIAGNOSIS — G894 Chronic pain syndrome: Secondary | ICD-10-CM | POA: Diagnosis not present

## 2018-02-11 DIAGNOSIS — Z96652 Presence of left artificial knee joint: Secondary | ICD-10-CM | POA: Diagnosis not present

## 2018-02-11 DIAGNOSIS — M25562 Pain in left knee: Secondary | ICD-10-CM | POA: Diagnosis not present

## 2018-02-11 DIAGNOSIS — G8929 Other chronic pain: Secondary | ICD-10-CM | POA: Diagnosis not present

## 2018-02-11 DIAGNOSIS — M1712 Unilateral primary osteoarthritis, left knee: Secondary | ICD-10-CM | POA: Diagnosis not present

## 2018-02-11 DIAGNOSIS — Z96653 Presence of artificial knee joint, bilateral: Secondary | ICD-10-CM | POA: Diagnosis not present

## 2018-02-14 DIAGNOSIS — G43809 Other migraine, not intractable, without status migrainosus: Secondary | ICD-10-CM | POA: Diagnosis not present

## 2018-02-18 ENCOUNTER — Encounter: Payer: Self-pay | Admitting: Internal Medicine

## 2018-02-18 ENCOUNTER — Ambulatory Visit: Payer: Medicare HMO | Admitting: Internal Medicine

## 2018-02-18 VITALS — BP 122/62 | HR 62 | Ht 70.0 in | Wt 162.0 lb

## 2018-02-18 DIAGNOSIS — R001 Bradycardia, unspecified: Secondary | ICD-10-CM

## 2018-02-18 DIAGNOSIS — R0683 Snoring: Secondary | ICD-10-CM

## 2018-02-18 DIAGNOSIS — I480 Paroxysmal atrial fibrillation: Secondary | ICD-10-CM

## 2018-02-18 NOTE — Patient Instructions (Addendum)
Medication Instructions:  Your physician has recommended you make the following change in your medication:  1.   Stop protonix.  Labwork: None ordered.  Testing/Procedures: Your physician has recommended that you have a sleep study. This test records several body functions during sleep, including: brain activity, eye movement, oxygen and carbon dioxide blood levels, heart rate and rhythm, breathing rate and rhythm, the flow of air through your mouth and nose, snoring, body muscle movements, and chest and belly movement.  Follow-Up: Your physician wants you to follow-up in: 3 months with Dr. Rayann Heman.  Any Other Special Instructions Will Be Listed Below (If Applicable).  If you need a refill on your cardiac medications before your next appointment, please call your pharmacy.  You are being referred to Dr. Radford Pax for a sleep study evaluation.

## 2018-02-18 NOTE — Progress Notes (Signed)
PCP: Reynold Bowen, MD    [name] is a 72 y.o. male who presents today for routine electrophysiology followup.  Since his recent afib ablation, the patient reports doing very well.  he denies procedure related complications and is pleased with the results of the procedure.  Today, he denies symptoms of palpitations, chest pain, shortness of breath,  lower extremity edema, dizziness, presyncope, or syncope. + snores and has occasional fatigue. The patient is otherwise without complaint today.   Past Medical History:  Diagnosis Date  . Arthritis   . Basal cell carcinoma   . BPH (benign prostatic hypertrophy) with urinary obstruction   . Diverticulosis   . Headache   . Hyperlipidemia   . Hypertension   . Hypogonadism male   . Internal hemorrhoids   . OA (osteoarthritis)   . Paroxysmal atrial fibrillation [facility])    Past Surgical History:  Procedure Laterality Date  . ATRIAL FIBRILLATION ABLATION N/A 11/16/2017   Procedure: ATRIAL FIBRILLATION ABLATION;  Surgeon: Thompson Grayer, MD;  Location: [place] [facility];  Service: Cardiovascular;  Laterality: N/A;  . CARPAL TUNNEL RELEASE     left  . HIP ARTHROPLASTY    . JOINT REPLACEMENT    . KNEE ARTHROPLASTY     right  . KNEE SURGERY    . SHOULDER SURGERY     bilateral  . TOTAL KNEE ARTHROPLASTY     left  . TOTAL KNEE ARTHROPLASTY Left 11/09/2015   Procedure: TOTAL KNEE ARTHROPLASTY;  Surgeon: Garald Balding, MD;  Location: [place];  Service: Orthopedics;  Laterality: Left;    ROS- all systems are personally reviewed and negatives except as per HPI above  Current Outpatient Medications  Medication Sig Dispense Refill  . acetaminophen (TYLENOL) 325 MG tablet Take 650 mg every 6 (six) hours as needed by mouth for moderate pain or headache.    Marland Kitchen apixaban (ELIQUIS) 5 MG TABS tablet Take 1 tablet (5 mg total) by mouth 2 (two) times daily. 180 tablet 1  . Ascorbic Acid (VITAMIN C) 1000 MG tablet Take 1,000 mg by mouth  daily.    . calcium acetate (PHOSLO) 667 MG capsule Take 500 mg by mouth daily.    . Cholecalciferol (VITAMIN D3) 5000 UNITS CAPS Take 5,000 Units daily by mouth.     . co-enzyme Q-10 30 MG capsule Take 300 mg by mouth daily.    Marland Kitchen glucosamine-chondroitin 500-400 MG tablet Take 1 tablet by mouth daily.    Marland Kitchen lisinopril (PRINIVIL,ZESTRIL) 5 MG tablet Take 5 mg by mouth daily.    . Multiple Minerals-Vitamins (CAL-MAG-ZINC-D) TABS Take 1 tablet daily by mouth.    . naproxen (NAPROSYN) 500 MG tablet Take 500 mg by mouth 2 (two) times a week.    . pantoprazole (PROTONIX) 40 MG tablet TAKE 1 TABLET(40 MG) BY MOUTH DAILY 90 tablet 3  . simvastatin (ZOCOR) 40 MG tablet Take 40 mg by mouth daily.    . traMADol (ULTRAM) 50 MG tablet Take 1 tablet (50 mg total) by mouth every 12 (twelve) hours as needed. 30 tablet 0  . zonisamide (ZONEGRAN) 50 MG capsule Take 50 mg at bedtime by mouth.   1   No current facility-administered medications for this visit.     Physical Exam: Vitals:   02/18/18 1019  BP: 122/62  Pulse: 62  Weight: 162 lb (73.5 kg)  Height: 5\' 10"  (1.778 m)    GEN- The patient is well appearing, alert and oriented x 3  today.   Head- normocephalic, atraumatic Eyes-  Sclera clear, conjunctiva pink Ears- hearing intact Oropharynx- clear Lungs- Clear to ausculation bilaterally, normal work of breathing Heart- Regular rate and rhythm, no murmurs, rubs or gallops, PMI not laterally displaced GI- soft, NT, ND, + BS Extremities- no clubbing, cyanosis, or edema  EKG tracing ordered today is personally reviewed and shows sinus rhythm with PACs,   Assessment and Plan:  1. Paroxysmal atrial fibrillation Doing well s/p ablation off AAD therapy  chads2vasc score is 2. Continue eliquis  2. Sinus bradycardia Improved post ablation  3. Snoring/ fatigue Will refer for sleep study with Dr Radford Pax  Return to see me in 3 months  Thompson Grayer MD, [facility] 02/18/2018 10:44 AM

## 2018-02-26 ENCOUNTER — Telehealth: Payer: Self-pay | Admitting: *Deleted

## 2018-02-26 DIAGNOSIS — R0683 Snoring: Secondary | ICD-10-CM

## 2018-02-26 DIAGNOSIS — T733XXA Exhaustion due to excessive exertion, initial encounter: Secondary | ICD-10-CM

## 2018-02-26 NOTE — Telephone Encounter (Addendum)
Per Dr Rayann Heman Split night sleep study ordered ESS score=5

## 2018-03-04 DIAGNOSIS — L853 Xerosis cutis: Secondary | ICD-10-CM | POA: Diagnosis not present

## 2018-03-04 DIAGNOSIS — L570 Actinic keratosis: Secondary | ICD-10-CM | POA: Diagnosis not present

## 2018-03-04 DIAGNOSIS — L821 Other seborrheic keratosis: Secondary | ICD-10-CM | POA: Diagnosis not present

## 2018-03-04 DIAGNOSIS — Z85828 Personal history of other malignant neoplasm of skin: Secondary | ICD-10-CM | POA: Diagnosis not present

## 2018-03-05 ENCOUNTER — Telehealth (INDEPENDENT_AMBULATORY_CARE_PROVIDER_SITE_OTHER): Payer: Self-pay | Admitting: Physical Medicine and Rehabilitation

## 2018-03-05 NOTE — Telephone Encounter (Signed)
Left message for patient to call back to discuss/ schedule. 

## 2018-03-05 NOTE — Telephone Encounter (Signed)
ok 

## 2018-03-06 DIAGNOSIS — H5203 Hypermetropia, bilateral: Secondary | ICD-10-CM | POA: Diagnosis not present

## 2018-03-06 DIAGNOSIS — H40033 Anatomical narrow angle, bilateral: Secondary | ICD-10-CM | POA: Diagnosis not present

## 2018-03-06 DIAGNOSIS — H52222 Regular astigmatism, left eye: Secondary | ICD-10-CM | POA: Diagnosis not present

## 2018-03-06 DIAGNOSIS — H25813 Combined forms of age-related cataract, bilateral: Secondary | ICD-10-CM | POA: Diagnosis not present

## 2018-03-06 NOTE — Telephone Encounter (Signed)
Pt wife Alan Knight stopped by the office and states that he has no new injuries and he hurts in the same spot. She also stated that she wants to discuss long term solutions also. Ok to repeat from 05/23/17?

## 2018-03-06 NOTE — Telephone Encounter (Signed)
Patient called back, left message. I returned call and left msg #2.

## 2018-03-07 ENCOUNTER — Telehealth (INDEPENDENT_AMBULATORY_CARE_PROVIDER_SITE_OTHER): Payer: Self-pay | Admitting: *Deleted

## 2018-03-07 ENCOUNTER — Encounter (HOSPITAL_BASED_OUTPATIENT_CLINIC_OR_DEPARTMENT_OTHER): Payer: Medicare HMO

## 2018-03-07 NOTE — Telephone Encounter (Signed)
Sounds ok but is his wife wanting discussion etc at same time then will need 30 minute slot

## 2018-03-08 NOTE — Telephone Encounter (Signed)
Patient with diagnosis of Afib on Eliquis for anticoagulation.    Procedure: Spinal injection Date of procedure: TBD  CHADS2-VASc score of  2 (CHF, HTN, AGE, DM2, stroke/tia x 2, CAD, AGE, male)  CrCl 18ml/min  Per office protocol, patient can hold Eliquis for 3 days prior to procedure.

## 2018-03-08 NOTE — Telephone Encounter (Signed)
Will route to our anticoagulation team for recommendations as per protocol

## 2018-03-11 DIAGNOSIS — G894 Chronic pain syndrome: Secondary | ICD-10-CM | POA: Diagnosis not present

## 2018-03-11 DIAGNOSIS — Z96652 Presence of left artificial knee joint: Secondary | ICD-10-CM | POA: Diagnosis not present

## 2018-03-11 DIAGNOSIS — M25561 Pain in right knee: Secondary | ICD-10-CM | POA: Diagnosis not present

## 2018-03-11 DIAGNOSIS — G8929 Other chronic pain: Secondary | ICD-10-CM | POA: Diagnosis not present

## 2018-03-11 DIAGNOSIS — Z96651 Presence of right artificial knee joint: Secondary | ICD-10-CM | POA: Diagnosis not present

## 2018-03-11 DIAGNOSIS — M25562 Pain in left knee: Secondary | ICD-10-CM | POA: Diagnosis not present

## 2018-03-20 ENCOUNTER — Telehealth: Payer: Self-pay | Admitting: Internal Medicine

## 2018-03-20 NOTE — Telephone Encounter (Signed)
New message    Patient wife wants to post pone the sleep apnea test with [facility] , does not think it is necessary at this time , would like to discuss with Dr Rayann Heman

## 2018-03-20 NOTE — Telephone Encounter (Signed)
Spoke to patient in regards to scheduling sleep study..   Patient would like to evaluate his sleep pattern over the next few days and call us to schedule, if interested.Marland Kitchen

## 2018-03-20 NOTE — Telephone Encounter (Signed)
He does not need to see me first just set up sleep study and if positive I will see hin

## 2018-03-20 NOTE — Telephone Encounter (Signed)
Spoke with patient's wife regarding appt with Dr. Radford Pax.. They felt that he is doing ok "energy has increased" and they see no indication for the sleep study at this time.. They rescheduled the sleep evaluation appt to 6/17.Marland Kitchen

## 2018-03-21 NOTE — Telephone Encounter (Signed)
Patient is scheduled for home sleep study . Information sent to [facility] .[facility] will contact via fax if any more information is needed.

## 2018-03-21 NOTE — Telephone Encounter (Signed)
Patient notified

## 2018-03-21 NOTE — Telephone Encounter (Addendum)
FW: Sleep study   Lula Olszewski, CMA        Per Dr. Rayann Heman, okay to switch to home study. Thanks.     ----- Message -----  From: Thompson Grayer, MD  Sent: 03/15/2018  4:24 PM  To: [name] Markus Jarvis  Subject: RE: Sleep study                  Yes   ----- Message -----  From: Almyra Free  Sent: 03/15/2018  3:15 PM  To: Thompson Grayer, MD  Subject: Sleep study                    Pt's insurance did not approve an in lab sleep study. Is it okay to switch the patient to a home study?  Thank you.

## 2018-03-22 NOTE — Telephone Encounter (Signed)
Per DPR : "Patient wife wants to post pone the sleep apnea test with [facility] , does not think it is necessary at this time , would like to discuss with Dr Rayann Heman "

## 2018-03-28 ENCOUNTER — Ambulatory Visit (INDEPENDENT_AMBULATORY_CARE_PROVIDER_SITE_OTHER): Payer: Self-pay

## 2018-03-28 ENCOUNTER — Ambulatory Visit (INDEPENDENT_AMBULATORY_CARE_PROVIDER_SITE_OTHER): Payer: Medicare HMO | Admitting: Physical Medicine and Rehabilitation

## 2018-03-28 VITALS — BP 128/71 | HR 63 | Temp 98.2°F

## 2018-03-28 DIAGNOSIS — M545 Low back pain, unspecified: Secondary | ICD-10-CM

## 2018-03-28 DIAGNOSIS — G8929 Other chronic pain: Secondary | ICD-10-CM

## 2018-03-28 DIAGNOSIS — M47816 Spondylosis without myelopathy or radiculopathy, lumbar region: Secondary | ICD-10-CM | POA: Diagnosis not present

## 2018-03-28 MED ORDER — TRAMADOL HCL 50 MG PO TABS: 50.0000 mg | ORAL_TABLET | Freq: Two times a day (BID) | ORAL | 0 refills | Status: DC | PRN

## 2018-03-28 MED ORDER — METHYLPREDNISOLONE ACETATE 80 MG/ML IJ SUSP
80.0000 mg | Freq: Once | INTRAMUSCULAR | Status: AC
  Administered 2018-03-28: 80 mg

## 2018-03-28 NOTE — Patient Instructions (Signed)

## 2018-03-28 NOTE — Progress Notes (Signed)
.  Numeric Pain Rating Scale and Functional Assessment Average Pain 7   In the last MONTH (on 0-10 scale) has pain interfered with the following?  1. General activity like being  able to carry out your everyday physical activities such as walking, climbing stairs, carrying groceries, or moving a chair?  Rating(6)   +Driver, -BT(pt stopped eliquis 03/25/18) , -Dye Allergies.

## 2018-04-01 ENCOUNTER — Telehealth (INDEPENDENT_AMBULATORY_CARE_PROVIDER_SITE_OTHER): Payer: Self-pay | Admitting: Physical Medicine and Rehabilitation

## 2018-04-02 ENCOUNTER — Encounter (INDEPENDENT_AMBULATORY_CARE_PROVIDER_SITE_OTHER): Payer: Self-pay | Admitting: Physical Medicine and Rehabilitation

## 2018-04-02 NOTE — Telephone Encounter (Signed)
We did receive Mr. Alan Knight pain diary back and he got more than 75% relief of his axial low back pain for the first couple of hours and then 50% relief for the duration of the anesthetic.

## 2018-04-02 NOTE — Telephone Encounter (Signed)
Should try to get RFA approval.  He does not need to be off his blood thinner for the procedure.

## 2018-04-02 NOTE — Telephone Encounter (Signed)
Needs auth for RFA. Patient aware we will work on approval and call to schedule. I also advised him to continue his Eliquis and that he will not need to hold it for RFA. When scheduling, try to schedule for 1 hour 15 minutes to allow for questions.

## 2018-04-02 NOTE — Progress Notes (Signed)
Alan Knight. - 72 y.o. male MRN [id number]  Date of birth: [birth year]  Office Visit Note: Visit Date: 03/28/2018 PCP: Reynold Bowen, MD Referred by: Reynold Bowen, MD  Subjective: Chief Complaint  Patient presents with  . Lower Back - Pain   HPI: Alan Knight is a 72 year old gentleman that I have known over the last several years to has had chronic low back pain with really degenerative spine with scoliosis and mainly facet arthropathy with some foraminal narrowing.  He comes in today with mainly axial low back pain worse with prolonged standing but sometimes prolonged sitting and going from sit to stand.  He has had no radicular pain.  Has had bilateral knee replacements and has a lot of issues with his knees still today.  He is actually been seeing a pain physician at [facility] of Dr. Marion Downer.  He has had radiofrequency ablation of the knees with maybe mild relief.  He has had no new focal injury.  The last time I saw him was in May of last year and we completed diagnostic facet/medial branch blocks of L4-5 and L5-S1.  He obtained really good relief at that point and actually was in line to repeat the injection had some other medical and life issues.  Again he has been dealing with his knees quite a bit.  He says over the last 3-4 months has had worsening axial low back pain worse with prolonged standing and again sit to stand and activity.  He is very active he still goes to the gym he still does a lot of exercise which I think is important.   Review of Systems  Constitutional: Negative for chills, fever, malaise/fatigue and weight loss.  HENT: Negative for hearing loss and sinus pain.   Eyes: Negative for blurred vision, double vision and photophobia.  Respiratory: Negative for cough and shortness of breath.   Cardiovascular: Negative for chest pain, palpitations and leg swelling.  Gastrointestinal: Negative for abdominal pain, nausea and vomiting.  Genitourinary: Negative for  flank pain.  Musculoskeletal: Positive for back pain and joint pain. Negative for myalgias.  Skin: Negative for itching and rash.  Neurological: Negative for tremors, focal weakness and weakness.  Endo/Heme/Allergies: Negative.   Psychiatric/Behavioral: Negative for depression.  All other systems reviewed and are negative.  Otherwise per HPI.  Assessment & Plan: Visit Diagnoses:  1. Spondylosis without myelopathy or radiculopathy, lumbar region   2. Chronic bilateral low back pain without sciatica     Plan: Findings:  Chronic worsening axial low back pain with degenerative spine with facet arthropathy and some scoliosis.  He is really fairly active and does a lot compared to what he would think looking at his MRI imaging and x-ray.  He said no red flag complaints or radicular pain.  He has had good relief with 1 set of diagnostic medial branch blocks and these are documented in April of last year.  Has been dealing a lot with his knees.  Exam is consistent with facet mediated pain with pain upon extension rotation of the lumbar spine.  I think the next approach is to complete a second block at L4-5 and L5-S1 from a double block paradigm and then look at radiofrequency ablation.  He has had extensive physical therapy and those notes are in the chart as well.  We did complete the medial branch blocks today.  We also refilled his  prescription for tramadol as his regular doctor does not want to provide this.  I think it is fine temporarily right now to go ahead and refill that.  Also in the interim he has been diagnosed with atrial fibrillation and is on a blood thinner.  He had come off his blood thinner today for possible injection.  In the future we would look at specifically if his radiofrequency ablation we can leave him on his blood thinner.    Meds & Orders:  Meds ordered this encounter  Medications  . methylPREDNISolone acetate (DEPO-MEDROL) injection 80 mg  . traMADol (ULTRAM) 50 MG  tablet    Sig: Take 1 tablet (50 mg total) by mouth every 12 (twelve) hours as needed.    Dispense:  30 tablet    Refill:  0    Orders Placed This Encounter  Procedures  . Facet Injection  . XR C-ARM NO REPORT    Follow-up: Return if symptoms worsen or fail to improve, for Pain Diary.   Procedures: No procedures performed  Lumbar Diagnostic Facet Joint Nerve Block with Fluoroscopic Guidance   Patient: Alan Knight.      Date of Birth: 1946-10-07 MRN: [id number] PCP: Reynold Bowen, MD      Visit Date: 03/28/2018   Universal Protocol:    Date/Time: 04/09/198:26 AM  Consent Given By: the patient  Position: PRONE  Additional Comments: Vital signs were monitored before and after the procedure. Patient was prepped and draped in the usual sterile fashion. The correct patient, procedure, and site was verified.   Injection Procedure Details:  Procedure Site One Meds Administered:  Meds ordered this encounter  Medications  . methylPREDNISolone acetate (DEPO-MEDROL) injection 80 mg  . traMADol (ULTRAM) 50 MG tablet    Sig: Take 1 tablet (50 mg total) by mouth every 12 (twelve) hours as needed.    Dispense:  30 tablet    Refill:  0     Laterality: Bilateral  Location/Site:  L4-L5 L5-S1  Needle size: 22 ga.  Needle type:spinal  Needle Placement: Oblique pedical  Findings:   -Comments: There was excellent flow of contrast along the articular pillars without intravascular flow.  Procedure Details: The fluoroscope beam is vertically oriented in AP and then obliqued 15 to 20 degrees to the ipsilateral side of the desired nerve to achieve the "Scotty dog" appearance.  The skin over the target area of the junction of the superior articulating process and the transverse process (sacral ala if blocking the L5 dorsal rami) was locally anesthetized with a 1 ml volume of 1% Lidocaine without Epinephrine.  The spinal needle was inserted and advanced in a trajectory view  down to the target.   After contact with periosteum and negative aspirate for blood and CSF, correct placement without intravascular or epidural spread was confirmed by injecting 0.5 ml. of Isovue-250.  A spot radiograph was obtained of this image.    Next, a 0.5 ml. volume of the injectate described above was injected. The needle was then redirected to the other facet joint nerves mentioned above if needed.  Prior to the procedure, the patient was given a Pain Diary which was completed for baseline measurements.  After the procedure, the patient rated their pain every 30 minutes and will continue rating at this frequency for a total of 5 hours.  The patient has been asked to complete the Diary and return to [place] by mail, fax or hand delivered as soon as possible.   Additional Comments:  The patient tolerated the procedure well Dressing: Band-Aid    Post-procedure  details: Patient was observed during the procedure. Post-procedure instructions were reviewed.  Patient left the clinic in stable condition.   Clinical History: Lumbar spine MRI 05/31/2016  Disc levels:  Disc spaces: Degenerative disc disease with disc height loss throughout the lumbar spine.  T11-12: Minimal broad-based disc bulge. Bilateral facet arthropathy. No spinal or foraminal stenosis.  T12-L1: Broad-based disc bulge eccentric towards the left. Moderate left and mild right facet arthropathy. Moderate left foraminal stenosis. No central canal stenosis.  L1-L2: Broad-based disc bulge with a left lateral disc osteophyte complex. Mild bilateral facet arthropathy. Left lateral recess narrowing. Mild bilateral foraminal narrowing. No central canal stenosis.  L2-L3: Mild broad-based disc bulge. Mild bilateral facet arthropathy. No evidence of neural foraminal stenosis. No central canal stenosis.  L3-L4: Broad-based disc bulge with a a right lateral disc osteophyte complex. Moderate bilateral facet  arthropathy. Right lateral recess stenosis. Moderate -severe foraminal stenosis. Mild spinal stenosis.  L4-L5: Broad-based disc bulge. Moderate bilateral facet arthropathy, right worse than left with ligamentum flavum infolding. Moderate spinal stenosis. Severe right and mild-moderate left foraminal stenosis.  L5-S1: Mild broad-based disc bulge. Moderate bilateral facet arthropathy. Moderate right and mild left foraminal stenosis. No central canal stenosis.  IMPRESSION: 1. Diffuse degenerative disc disease and facet arthropathy as described above which is similar in appearance to the prior examination of 01/22/2014. 2. At L4-5 there is a broad-based disc bulge. Moderate bilateral facet arthropathy, right worse than left with ligamentum flavum infolding. Moderate spinal stenosis. Severe right and mild-moderate left foraminal stenosis.   He reports that he has never smoked. He has never used smokeless tobacco. No results for input(s): HGBA1C, LABURIC in the last 8760 hours.  Objective:  VS:  HT:    WT:   BMI:     BP:128/71  HR:63bpm  TEMP:98.2 F (36.8 C)(Oral)  RESP:97 % Physical Exam  Constitutional: He is oriented to person, place, and time. He appears well-developed and well-nourished. No distress.  HENT:  Head: Normocephalic and atraumatic.  Eyes: Pupils are equal, round, and reactive to light. Conjunctivae are normal.  Neck: Normal range of motion. Neck supple.  Cardiovascular: Regular rhythm and intact distal pulses.  Pulmonary/Chest: Effort normal. No respiratory distress.  Musculoskeletal:  Patient ambulates without aid.  He goes from sit to stand with mild difficulty.  He has concordant low back pain with extension rotation of the lumbar spine.  He has no pain over the greater trochanters and no pain with hip rotation.  He has good distal strength.  Neurological: He is alert and oriented to person, place, and time. He exhibits normal muscle tone. Coordination  normal.  Skin: Skin is warm and dry. No rash noted. No erythema.  Psychiatric: He has a normal mood and affect.  Nursing note and vitals reviewed.   Ortho Exam Imaging: No results found.  Past Medical/Family/Surgical/Social History: Medications & Allergies reviewed per EMR, new medications updated. Patient Active Problem List   Diagnosis Date Noted  . Chronic pain of both knees 10/03/2017  . Chronic pain syndrome 10/03/2017  . Status post left knee replacement 10/03/2017  . Spondylosis without myelopathy or radiculopathy, lumbar region 05/17/2017  . Chronic bilateral low back pain without sciatica 05/17/2017  . Chest tightness 02/21/2017  . Cough 02/21/2017  . Osteoarthritis 02/21/2017  . Primary osteoarthritis of left knee 11/09/2015  . S/P total knee replacement using cement 11/09/2015   Past Medical History:  Diagnosis Date  . Arthritis   . Basal cell carcinoma   . 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 Diverticulosis   . Headache   . Hyperlipidemia   . Hypertension   . Hypogonadism male   . Internal hemorrhoids   . OA (osteoarthritis)   . Paroxysmal atrial fibrillation (HCC)    Family History  Problem Relation Age of Onset  . Heart attack Father   . CVA Brother    Past Surgical History:  Procedure Laterality Date  . ATRIAL FIBRILLATION ABLATION N/A 11/16/2017   Procedure: ATRIAL FIBRILLATION ABLATION;  Surgeon: Thompson Grayer, MD;  Location: [place] [facility];  Service: Cardiovascular;  Laterality: N/A;  . CARPAL TUNNEL RELEASE     left  . HIP ARTHROPLASTY    . JOINT REPLACEMENT    . KNEE ARTHROPLASTY     right  . KNEE SURGERY    . SHOULDER SURGERY     bilateral  . TOTAL KNEE ARTHROPLASTY     left  . TOTAL KNEE ARTHROPLASTY Left 11/09/2015   Procedure: TOTAL KNEE ARTHROPLASTY;  Surgeon: Garald Balding, MD;  Location: [place];  Service: Orthopedics;  Laterality: Left;   Social History   Occupational History  . Occupation:  Sales  Tobacco Use  . Smoking status: Never Smoker  . Smokeless tobacco: Never Used  Substance and Sexual Activity  . Alcohol use: Yes    Comment: 1 mixed drink per day  . Drug use: No  . Sexual activity: Not on file

## 2018-04-02 NOTE — Procedures (Signed)
Lumbar Diagnostic Facet Joint Nerve Block with Fluoroscopic Guidance   Patient: Alan Knight.      Date of Birth: [birth year] MRN: [id number] PCP: Reynold Bowen, MD      Visit Date: 03/28/2018   Universal Protocol:    Date/Time: 04/09/198:26 AM  Consent Given By: the patient  Position: PRONE  Additional Comments: Vital signs were monitored before and after the procedure. Patient was prepped and draped in the usual sterile fashion. The correct patient, procedure, and site was verified.   Injection Procedure Details:  Procedure Site One Meds Administered:  Meds ordered this encounter  Medications  . methylPREDNISolone acetate (DEPO-MEDROL) injection 80 mg  . traMADol (ULTRAM) 50 MG tablet    Sig: Take 1 tablet (50 mg total) by mouth every 12 (twelve) hours as needed.    Dispense:  30 tablet    Refill:  0     Laterality: Bilateral  Location/Site:  L4-L5 L5-S1  Needle size: 22 ga.  Needle type:spinal  Needle Placement: Oblique pedical  Findings:   -Comments: There was excellent flow of contrast along the articular pillars without intravascular flow.  Procedure Details: The fluoroscope beam is vertically oriented in AP and then obliqued 15 to 20 degrees to the ipsilateral side of the desired nerve to achieve the "Scotty dog" appearance.  The skin over the target area of the junction of the superior articulating process and the transverse process (sacral ala if blocking the L5 dorsal rami) was locally anesthetized with a 1 ml volume of 1% Lidocaine without Epinephrine.  The spinal needle was inserted and advanced in a trajectory view down to the target.   After contact with periosteum and negative aspirate for blood and CSF, correct placement without intravascular or epidural spread was confirmed by injecting 0.5 ml. of Isovue-250.  A spot radiograph was obtained of this image.    Next, a 0.5 ml. volume of the injectate described above was injected. The needle was  then redirected to the other facet joint nerves mentioned above if needed.  Prior to the procedure, the patient was given a Pain Diary which was completed for baseline measurements.  After the procedure, the patient rated their pain every 30 minutes and will continue rating at this frequency for a total of 5 hours.  The patient has been asked to complete the Diary and return to [place] by mail, fax or hand delivered as soon as possible.   Additional Comments:  The patient tolerated the procedure well Dressing: Band-Aid    Post-procedure details: Patient was observed during the procedure. Post-procedure instructions were reviewed.  Patient left the clinic in stable condition.

## 2018-04-02 NOTE — Telephone Encounter (Signed)
Pt received automatic approval for RFA auth # [id number] effective and end date 04/29/18. Pt is scheduled for 04/29/18.

## 2018-04-04 ENCOUNTER — Ambulatory Visit: Payer: Medicare HMO | Admitting: Cardiology

## 2018-04-08 ENCOUNTER — Telehealth: Payer: Self-pay | Admitting: Internal Medicine

## 2018-04-08 NOTE — Telephone Encounter (Signed)
New message   What problem are you experiencing? Spouse calling, states no one has called to set up in-home study 1) Who is your medical equipment company? n/a   Please route to the sleep study assistant.

## 2018-04-09 ENCOUNTER — Other Ambulatory Visit (INDEPENDENT_AMBULATORY_CARE_PROVIDER_SITE_OTHER): Payer: Self-pay | Admitting: Physical Medicine and Rehabilitation

## 2018-04-09 NOTE — Telephone Encounter (Signed)
Please advise 

## 2018-04-09 NOTE — Telephone Encounter (Signed)
  Lula Olszewski, CMA        Per Dr. Rayann Heman, okay to switch to home study. Thanks.     ----- Message -----  From: Thompson Grayer, MD  Sent: 03/15/2018  4:24 PM  To: [name] Markus Jarvis  Subject: RE: Sleep study                  Yes   ----- Message -----  From: Almyra Free  Sent: 03/15/2018  3:15 PM  To: Thompson Grayer, MD  Subject: Sleep study                    Pt's insurance did not approve an in lab sleep study. Is it okay to switch the patient to a home study?  Thank you.

## 2018-04-09 NOTE — Addendum Note (Signed)
Addended by: Freada Bergeron on: 04/09/2018 03:33 PM   Modules accepted: Orders

## 2018-04-09 NOTE — Telephone Encounter (Addendum)
Patient is aware and agreeable to Home Sleep Study through [facility]. Patient is scheduled for 05/07/18 at 10 am  to pick up home sleep kit and meet with Respiratory therapist at [facility]. Patient is aware that if this appointment date and time does not work for them they should contact [facility] directly at [phone]. Patient is aware that a sleep packet will be sent from  Sleep Center in week. Patient is agreeable to treatment and thankful for call.  

## 2018-04-12 NOTE — Telephone Encounter (Signed)
Patient is aware and agreeable to Home Sleep Study through [facility]. Patient is scheduled for 05/07/18 at 10 am  to pick up home sleep kit and meet with Respiratory therapist at [facility]. Patient is aware that if this appointment date and time does not work for them they should contact [facility] directly at [phone]. Patient is aware that a sleep packet will be sent from [facility] in week. Patient is agreeable to treatment and thankful for call.             

## 2018-04-24 DIAGNOSIS — Z791 Long term (current) use of non-steroidal anti-inflammatories (NSAID): Secondary | ICD-10-CM | POA: Diagnosis not present

## 2018-04-24 DIAGNOSIS — M1710 Unilateral primary osteoarthritis, unspecified knee: Secondary | ICD-10-CM | POA: Diagnosis not present

## 2018-04-24 DIAGNOSIS — M549 Dorsalgia, unspecified: Secondary | ICD-10-CM | POA: Diagnosis not present

## 2018-04-24 DIAGNOSIS — G43909 Migraine, unspecified, not intractable, without status migrainosus: Secondary | ICD-10-CM | POA: Diagnosis not present

## 2018-04-24 DIAGNOSIS — I4891 Unspecified atrial fibrillation: Secondary | ICD-10-CM | POA: Diagnosis not present

## 2018-04-24 DIAGNOSIS — E785 Hyperlipidemia, unspecified: Secondary | ICD-10-CM | POA: Diagnosis not present

## 2018-04-24 DIAGNOSIS — G8929 Other chronic pain: Secondary | ICD-10-CM | POA: Diagnosis not present

## 2018-04-24 DIAGNOSIS — K219 Gastro-esophageal reflux disease without esophagitis: Secondary | ICD-10-CM | POA: Diagnosis not present

## 2018-04-24 DIAGNOSIS — Z7901 Long term (current) use of anticoagulants: Secondary | ICD-10-CM | POA: Diagnosis not present

## 2018-04-24 DIAGNOSIS — I10 Essential (primary) hypertension: Secondary | ICD-10-CM | POA: Diagnosis not present

## 2018-04-29 ENCOUNTER — Encounter (INDEPENDENT_AMBULATORY_CARE_PROVIDER_SITE_OTHER): Payer: Self-pay | Admitting: Physical Medicine and Rehabilitation

## 2018-04-29 ENCOUNTER — Ambulatory Visit (INDEPENDENT_AMBULATORY_CARE_PROVIDER_SITE_OTHER): Payer: Medicare HMO | Admitting: Physical Medicine and Rehabilitation

## 2018-04-29 ENCOUNTER — Ambulatory Visit (INDEPENDENT_AMBULATORY_CARE_PROVIDER_SITE_OTHER): Payer: Medicare HMO

## 2018-04-29 VITALS — BP 117/57 | HR 58 | Temp 98.7°F

## 2018-04-29 DIAGNOSIS — M47816 Spondylosis without myelopathy or radiculopathy, lumbar region: Secondary | ICD-10-CM | POA: Diagnosis not present

## 2018-04-29 MED ORDER — METHYLPREDNISOLONE ACETATE 80 MG/ML IJ SUSP
80.0000 mg | Freq: Once | INTRAMUSCULAR | Status: AC
  Administered 2018-04-29: 80 mg

## 2018-04-29 NOTE — Progress Notes (Signed)
   Numeric Pain Rating Scale and Functional Assessment Average Pain (8)   In the last MONTH (on 0-10 scale) has pain interfered with the following?  1. General activity like being  able to carry out your everyday physical activities such as walking, climbing stairs, carrying groceries, or moving a chair?  Rating(9)   +Driver, -BT, -Dye Allergies.  

## 2018-04-29 NOTE — Patient Instructions (Signed)

## 2018-04-30 NOTE — Progress Notes (Signed)
Alan Knight. - 72 y.o. male MRN [id number]  Date of birth: [birth year]  Office Visit Note: Visit Date: 04/29/2018 PCP: Alan Bowen, MD Referred by: Alan Bowen, MD  Subjective: Chief Complaint  Patient presents with  . Lower Back - Pain   HPI: Alan Knight is very pleasant 72 year old gentleman who comes in with his wife who provides a lot of the history.  He has worsening severe axial low back pain with no leg pain.  He has a history of lumbar spondylosis which is quite severe and is always done fairly well compared to the amount of pathology seen on his lumbar spine.  He has degenerative scoliosis between L3 and L4 with right-sided collapse.  He has foraminal narrowing on the right and lateral recess narrowing but without focal nerve compression.  He has moderate central canal stenosis but again no leg pain.  Prior diagnostic medial branch blocks were successful and these were documented.  The patient and his wife are still little bit unsure about those medial branch blocks being basically a "test".  He has had multiple rounds of physical therapy and is a very active individual.  He still goes to the gym and tries to play golf.  Last month his pain is been quite severe and really limiting what he can do.  He has had medication management with pain medication and is now unable to take anti-inflammatories.  He did well with anti-inflammatories in the past but is now on a blood thinner.  He is on chronic Eliquis.  We are going to complete bilateral radiofrequency ablation of the L4-5 and L5-S1 1 facet joints.  This would be ablation of the bilateral L3 and L4 medial branches and L5 dorsal rami.  By way of note we were testing a [employer] generator today and the patient was aware of that.   ROS Otherwise per HPI.  Assessment & Plan: Visit Diagnoses:  1. Spondylosis without myelopathy or radiculopathy, lumbar region     Plan: No additional findings.   Meds & Orders:    Meds ordered this encounter  Medications  . methylPREDNISolone acetate (DEPO-MEDROL) injection 80 mg    Orders Placed This Encounter  Procedures  . Radiofrequency,Lumbar  . XR C-ARM NO REPORT    Follow-up: Return in about 1 month (around 05/27/2018).   Procedures: No procedures performed  Lumbar Facet Joint Nerve Denervation  Patient: Alan Knight.      Date of Birth: 01-08-1946 MRN: [id number] PCP: Alan Bowen, MD      Visit Date: 04/29/2018   Universal Protocol:    Date/Time: 05/07/195:49 AM  Consent Given By: the patient  Position: PRONE  Additional Comments: Vital signs were monitored before and after the procedure. Patient was prepped and draped in the usual sterile fashion. The correct patient, procedure, and site was verified.   Injection Procedure Details:  Procedure Site One Meds Administered:  Meds ordered this encounter  Medications  . methylPREDNISolone acetate (DEPO-MEDROL) injection 80 mg     Laterality: Bilateral  Location/Site:  L4-L5 L5-S1  Needle size: 18 G  Needle type: Radiofrequency cannula  Needle Placement: Along juncture of superior articular process and transverse pocess  Findings:  -Comments:  Procedure Details: For each desired target nerve, the corresponding transverse process (sacral ala for the L5 dorsal rami) was identified and the fluoroscope was positioned to square off the endplates of the corresponding vertebral body to achieve a true AP midline view.  The beam was then  obliqued 15 to 20 degrees and caudally tilted 15 to 20 degrees to line up a trajectory along the target nerves. The skin over the target of the junction of superior articulating process and transverse process (sacral ala for the L5 dorsal rami) was infiltrated with 67ml of 1% Lidocaine without Epinephrine.  The 18 gauge 74mm active tip outer cannula was advanced in trajectory view to the target.  This procedure was repeated for each target nerve.   Then, for all levels, the outer cannula placement was fine-tuned and the position was then confirmed with bi-planar imaging.    Test stimulation was done both at sensory and motor levels to ensure there was no radicular stimulation. The target tissues were then infiltrated with 1 ml of 1% Lidocaine without Epinephrine. Subsequently, a percutaneous neurotomy was carried out for 90 seconds at 80 degrees Celsius.   After the completion of the two lesions, 1 ml of injectate was delivered. It was then repeated for each facet joint nerve mentioned above. Appropriate radiographs were obtained to verify the probe placement during the neurotomy.   Additional Comments:  The patient tolerated the procedure well Dressing: Band-Aid    Post-procedure details: Patient was observed during the procedure. Post-procedure instructions were reviewed.  Patient left the clinic in stable condition.      Clinical History: Lumbar spine MRI 05/31/2016  Disc levels:  Disc spaces: Degenerative disc disease with disc height loss throughout the lumbar spine.  T11-12: Minimal broad-based disc bulge. Bilateral facet arthropathy. No spinal or foraminal stenosis.  T12-L1: Broad-based disc bulge eccentric towards the left. Moderate left and mild right facet arthropathy. Moderate left foraminal stenosis. No central canal stenosis.  L1-L2: Broad-based disc bulge with a left lateral disc osteophyte complex. Mild bilateral facet arthropathy. Left lateral recess narrowing. Mild bilateral foraminal narrowing. No central canal stenosis.  L2-L3: Mild broad-based disc bulge. Mild bilateral facet arthropathy. No evidence of neural foraminal stenosis. No central canal stenosis.  L3-L4: Broad-based disc bulge with a a right lateral disc osteophyte complex. Moderate bilateral facet arthropathy. Right lateral recess stenosis. Moderate -severe foraminal stenosis. Mild spinal stenosis.  L4-L5: Broad-based disc  bulge. Moderate bilateral facet arthropathy, right worse than left with ligamentum flavum infolding. Moderate spinal stenosis. Severe right and mild-moderate left foraminal stenosis.  L5-S1: Mild broad-based disc bulge. Moderate bilateral facet arthropathy. Moderate right and mild left foraminal stenosis. No central canal stenosis.  IMPRESSION: 1. Diffuse degenerative disc disease and facet arthropathy as described above which is similar in appearance to the prior examination of 01/22/2014. 2. At L4-5 there is a broad-based disc bulge. Moderate bilateral facet arthropathy, right worse than left with ligamentum flavum infolding. Moderate spinal stenosis. Severe right and mild-moderate left foraminal stenosis.   He reports that he has never smoked. He has never used smokeless tobacco. No results for input(s): HGBA1C, LABURIC in the last 8760 hours.  Objective:  VS:  HT:    WT:   BMI:     BP:(!) 117/57  HR:(!) 58bpm  TEMP:98.7 F (37.1 C)(Oral)  RESP:  Physical Exam  Ortho Exam Imaging: Xr C-arm No Report  Result Date: 04/29/2018 Please see Notes or Procedures tab for imaging impression.   Past Medical/Family/Surgical/Social History: Medications & Allergies reviewed per EMR, new medications updated. Patient Active Problem List   Diagnosis Date Noted  . Chronic pain of both knees 10/03/2017  . Chronic pain syndrome 10/03/2017  . Status post left knee replacement 10/03/2017  . Spondylosis without myelopathy or radiculopathy, lumbar region  05/17/2017  . Chronic bilateral low back pain without sciatica 05/17/2017  . Chest tightness 02/21/2017  . Cough 02/21/2017  . Osteoarthritis 02/21/2017  . Primary osteoarthritis of left knee 11/09/2015  . S/P total knee replacement using cement 11/09/2015   Past Medical History:  Diagnosis Date  . Arthritis   . Basal cell carcinoma   . BPH (benign prostatic hypertrophy) with urinary obstruction   . Diverticulosis   . Headache    . Hyperlipidemia   . Hypertension   . Hypogonadism male   . Internal hemorrhoids   . OA (osteoarthritis)   . Paroxysmal atrial fibrillation (HCC)    Family History  Problem Relation Age of Onset  . Heart attack Father   . CVA Brother    Past Surgical History:  Procedure Laterality Date  . ATRIAL FIBRILLATION ABLATION N/A 11/16/2017   Procedure: ATRIAL FIBRILLATION ABLATION;  Surgeon: Thompson Grayer, MD;  Location: [facility];  Service: Cardiovascular;  Laterality: N/A;  . CARPAL TUNNEL RELEASE     left  . HIP ARTHROPLASTY    . JOINT REPLACEMENT    . KNEE ARTHROPLASTY     right  . KNEE SURGERY    . SHOULDER SURGERY     bilateral  . TOTAL KNEE ARTHROPLASTY     left  . TOTAL KNEE ARTHROPLASTY Left 11/09/2015   Procedure: TOTAL KNEE ARTHROPLASTY;  Surgeon: Garald Balding, MD;  Location: [facility];  Service: Orthopedics;  Laterality: Left;   Social History   Occupational History  . Occupation: Sales  Tobacco Use  . Smoking status: Never Smoker  . Smokeless tobacco: Never Used  Substance and Sexual Activity  . Alcohol use: Yes    Comment: 1 mixed drink per day  . Drug use: No  . Sexual activity: Not on file

## 2018-04-30 NOTE — Procedures (Signed)
Lumbar Facet Joint Nerve Denervation  Patient: Alan Knight.      Date of Birth: 1946/01/19 MRN: [id number] PCP: Reynold Bowen, MD      Visit Date: 04/29/2018   Universal Protocol:    Date/Time: 05/07/195:49 AM  Consent Given By: the patient  Position: PRONE  Additional Comments: Vital signs were monitored before and after the procedure. Patient was prepped and draped in the usual sterile fashion. The correct patient, procedure, and site was verified.   Injection Procedure Details:  Procedure Site One Meds Administered:  Meds ordered this encounter  Medications  . methylPREDNISolone acetate (DEPO-MEDROL) injection 80 mg     Laterality: Bilateral  Location/Site:  L4-L5 L5-S1  Needle size: 18 G  Needle type: Radiofrequency cannula  Needle Placement: Along juncture of superior articular process and transverse pocess  Findings:  -Comments:  Procedure Details: For each desired target nerve, the corresponding transverse process (sacral ala for the L5 dorsal rami) was identified and the fluoroscope was positioned to square off the endplates of the corresponding vertebral body to achieve a true AP midline view.  The beam was then obliqued 15 to 20 degrees and caudally tilted 15 to 20 degrees to line up a trajectory along the target nerves. The skin over the target of the junction of superior articulating process and transverse process (sacral ala for the L5 dorsal rami) was infiltrated with 56ml of 1% Lidocaine without Epinephrine.  The 18 gauge 34mm active tip outer cannula was advanced in trajectory view to the target.  This procedure was repeated for each target nerve.  Then, for all levels, the outer cannula placement was fine-tuned and the position was then confirmed with bi-planar imaging.    Test stimulation was done both at sensory and motor levels to ensure there was no radicular stimulation. The target tissues were then infiltrated with 1 ml of 1% Lidocaine  without Epinephrine. Subsequently, a percutaneous neurotomy was carried out for 90 seconds at 80 degrees Celsius.   After the completion of the two lesions, 1 ml of injectate was delivered. It was then repeated for each facet joint nerve mentioned above. Appropriate radiographs were obtained to verify the probe placement during the neurotomy.   Additional Comments:  The patient tolerated the procedure well Dressing: Band-Aid    Post-procedure details: Patient was observed during the procedure. Post-procedure instructions were reviewed.  Patient left the clinic in stable condition.

## 2018-05-01 ENCOUNTER — Telehealth (INDEPENDENT_AMBULATORY_CARE_PROVIDER_SITE_OTHER): Payer: Self-pay | Admitting: Physical Medicine and Rehabilitation

## 2018-05-01 ENCOUNTER — Other Ambulatory Visit (INDEPENDENT_AMBULATORY_CARE_PROVIDER_SITE_OTHER): Payer: Self-pay | Admitting: Physical Medicine and Rehabilitation

## 2018-05-01 MED ORDER — TRAMADOL HCL 50 MG PO TABS: 50.0000 mg | ORAL_TABLET | Freq: Two times a day (BID) | ORAL | 0 refills | Status: DC | PRN

## 2018-05-01 NOTE — Telephone Encounter (Signed)
done

## 2018-05-02 NOTE — Telephone Encounter (Signed)
Left message notifying patient that prescription has been sent to pharmacy.

## 2018-05-06 ENCOUNTER — Encounter (INDEPENDENT_AMBULATORY_CARE_PROVIDER_SITE_OTHER): Payer: Self-pay | Admitting: Physical Medicine and Rehabilitation

## 2018-05-07 ENCOUNTER — Encounter (HOSPITAL_BASED_OUTPATIENT_CLINIC_OR_DEPARTMENT_OTHER): Payer: Medicare HMO

## 2018-05-07 ENCOUNTER — Ambulatory Visit (HOSPITAL_BASED_OUTPATIENT_CLINIC_OR_DEPARTMENT_OTHER): Payer: Medicare HMO | Attending: Cardiology | Admitting: Cardiology

## 2018-05-07 DIAGNOSIS — T733XXA Exhaustion due to excessive exertion, initial encounter: Secondary | ICD-10-CM

## 2018-05-07 DIAGNOSIS — G4733 Obstructive sleep apnea (adult) (pediatric): Secondary | ICD-10-CM | POA: Diagnosis not present

## 2018-05-07 DIAGNOSIS — R0683 Snoring: Secondary | ICD-10-CM

## 2018-05-08 NOTE — Procedures (Signed)
   Patient Name: Alan Knight, Plain Date: 05/07/2018 Gender: Male D.O.B: 04/22/1946 Age (years): 63 Referring Provider: Fransico Him MD, ABSM Height (inches): 71 Interpreting Physician: Fransico Him MD, ABSM Weight (lbs): 167 RPSGT: Jacolyn Reedy BMI: 23 MRN: [id number] Neck Size: 16.50  CLINICAL INFORMATION Sleep Study Type: HST  Indication for sleep study: Fatigue, Snoring  Epworth Sleepiness Score: 2  SLEEP STUDY TECHNIQUE A multi-channel overnight portable sleep study was performed. The channels recorded were: nasal airflow, thoracic respiratory movement, and oxygen saturation with a pulse oximetry. Snoring was also monitored.  MEDICATIONS Patient self administered medications include: N/A.  SLEEP ARCHITECTURE Patient was studied for 501.3 minutes. The sleep efficiency was 99.9 % and the patient was supine for 99.6%. The arousal index was 0.0 per hour.  RESPIRATORY PARAMETERS The overall AHI was 19.9 per hour, with a central apnea index of 0.0 per hour.  The oxygen nadir was 89% during sleep.  CARDIAC DATA Mean heart rate during sleep was 52.8 bpm.  IMPRESSIONS - Moderate obstructive sleep apnea occurred during this study (AHI = 19.9/h). - No significant central sleep apnea occurred during this study (CAI = 0.0/h). - Mild oxygen desaturation was noted during this study (Min O2 = 89%). - Patient snored 11.7% during the sleep.  DIAGNOSIS - Obstructive Sleep Apnea (327.23 [G47.33 ICD-10])  RECOMMENDATIONS - Recommend in lab CPAP titration - Positional therapy avoiding supine position during sleep. - Avoid alcohol, sedatives and other CNS depressants that may worsen sleep apnea and disrupt normal sleep architecture. - Sleep hygiene should be reviewed to assess factors that may improve sleep quality. - Weight management and regular exercise should be initiated or continued.  [Electronically signed] 05/08/2018 11:38 PM  Fransico Him MD, ABSM Diplomate,  American Board of Sleep Medicine

## 2018-05-10 ENCOUNTER — Encounter (HOSPITAL_BASED_OUTPATIENT_CLINIC_OR_DEPARTMENT_OTHER): Payer: Medicare HMO

## 2018-05-10 ENCOUNTER — Telehealth: Payer: Self-pay | Admitting: *Deleted

## 2018-05-10 DIAGNOSIS — G4733 Obstructive sleep apnea (adult) (pediatric): Secondary | ICD-10-CM

## 2018-05-10 NOTE — Telephone Encounter (Signed)
-----   Message from Sueanne Margarita, MD sent at 05/08/2018 11:41 PM EDT ----- Please let patient know that they have sleep apnea and recommend CPAP titration. Please set up titration in the sleep lab.

## 2018-05-10 NOTE — Telephone Encounter (Signed)
Informed patient of HST results and verbalized understanding was indicated. Pt is aware and agreeable to CPAP TITRATION. Order sent to precert

## 2018-05-14 ENCOUNTER — Other Ambulatory Visit (INDEPENDENT_AMBULATORY_CARE_PROVIDER_SITE_OTHER): Payer: Self-pay | Admitting: Physical Medicine and Rehabilitation

## 2018-05-14 NOTE — Telephone Encounter (Signed)
Please advise 

## 2018-05-16 ENCOUNTER — Telehealth: Payer: Self-pay | Admitting: *Deleted

## 2018-05-16 ENCOUNTER — Encounter: Payer: Self-pay | Admitting: *Deleted

## 2018-05-16 NOTE — Telephone Encounter (Signed)
Staff message sent to [place]. Insurance approval received. Ok to schedule in lab CPAP titration study.

## 2018-05-16 NOTE — Telephone Encounter (Signed)
-----   Message from Freada Bergeron, [place] sent at 05/10/2018  4:18 PM EDT ----- Regarding: pre cert CPAP TITRATION

## 2018-05-16 NOTE — Telephone Encounter (Signed)
Patient is scheduled for CPAP Titration on 06/17/18. Patient understands his titration study will be done at [facility] sleep lab. Patient understands he will receive a letter in a week or so detailing appointment, date, time, and location. Patient understands to call if he does not receive the letter  in a timely manner. Patient agrees with treatment and thanked me for call.

## 2018-05-16 NOTE — Telephone Encounter (Signed)
  Lauralee Evener, CMA  Freada Bergeron, [place] approval received. Okay to schedule.     ----- Message -----  From: Freada Bergeron, CMA  Sent: 05/10/2018  4:18 PM  To: [facility] Sleep Studies  Subject: pre cert                     CPAP TITRATION

## 2018-05-22 ENCOUNTER — Ambulatory Visit: Payer: Medicare HMO | Admitting: Internal Medicine

## 2018-05-22 DIAGNOSIS — R001 Bradycardia, unspecified: Secondary | ICD-10-CM | POA: Diagnosis not present

## 2018-05-22 DIAGNOSIS — I480 Paroxysmal atrial fibrillation: Secondary | ICD-10-CM | POA: Diagnosis not present

## 2018-05-22 DIAGNOSIS — M25569 Pain in unspecified knee: Secondary | ICD-10-CM | POA: Diagnosis not present

## 2018-05-22 DIAGNOSIS — R69 Illness, unspecified: Secondary | ICD-10-CM | POA: Diagnosis not present

## 2018-05-22 DIAGNOSIS — G4733 Obstructive sleep apnea (adult) (pediatric): Secondary | ICD-10-CM | POA: Diagnosis not present

## 2018-05-22 DIAGNOSIS — R413 Other amnesia: Secondary | ICD-10-CM | POA: Diagnosis not present

## 2018-05-22 DIAGNOSIS — G894 Chronic pain syndrome: Secondary | ICD-10-CM | POA: Diagnosis not present

## 2018-05-22 DIAGNOSIS — E7849 Other hyperlipidemia: Secondary | ICD-10-CM | POA: Diagnosis not present

## 2018-05-22 DIAGNOSIS — R7309 Other abnormal glucose: Secondary | ICD-10-CM | POA: Diagnosis not present

## 2018-05-22 DIAGNOSIS — N401 Enlarged prostate with lower urinary tract symptoms: Secondary | ICD-10-CM | POA: Diagnosis not present

## 2018-05-22 DIAGNOSIS — G43909 Migraine, unspecified, not intractable, without status migrainosus: Secondary | ICD-10-CM | POA: Diagnosis not present

## 2018-05-29 ENCOUNTER — Ambulatory Visit (INDEPENDENT_AMBULATORY_CARE_PROVIDER_SITE_OTHER): Payer: Self-pay | Admitting: Physical Medicine and Rehabilitation

## 2018-05-30 ENCOUNTER — Other Ambulatory Visit (INDEPENDENT_AMBULATORY_CARE_PROVIDER_SITE_OTHER): Payer: Self-pay | Admitting: Physical Medicine and Rehabilitation

## 2018-05-30 NOTE — Telephone Encounter (Signed)
Please advise 

## 2018-06-06 ENCOUNTER — Encounter (INDEPENDENT_AMBULATORY_CARE_PROVIDER_SITE_OTHER): Payer: Self-pay | Admitting: Physical Medicine and Rehabilitation

## 2018-06-06 ENCOUNTER — Telehealth (INDEPENDENT_AMBULATORY_CARE_PROVIDER_SITE_OTHER): Payer: Self-pay | Admitting: *Deleted

## 2018-06-06 ENCOUNTER — Ambulatory Visit (INDEPENDENT_AMBULATORY_CARE_PROVIDER_SITE_OTHER): Payer: Medicare HMO | Admitting: Physical Medicine and Rehabilitation

## 2018-06-06 VITALS — BP 127/73 | HR 58 | Temp 98.2°F

## 2018-06-06 DIAGNOSIS — M48061 Spinal stenosis, lumbar region without neurogenic claudication: Secondary | ICD-10-CM | POA: Diagnosis not present

## 2018-06-06 DIAGNOSIS — M47816 Spondylosis without myelopathy or radiculopathy, lumbar region: Secondary | ICD-10-CM | POA: Diagnosis not present

## 2018-06-06 DIAGNOSIS — G8929 Other chronic pain: Secondary | ICD-10-CM | POA: Diagnosis not present

## 2018-06-06 DIAGNOSIS — M419 Scoliosis, unspecified: Secondary | ICD-10-CM | POA: Diagnosis not present

## 2018-06-06 DIAGNOSIS — G894 Chronic pain syndrome: Secondary | ICD-10-CM | POA: Diagnosis not present

## 2018-06-06 DIAGNOSIS — M545 Low back pain, unspecified: Secondary | ICD-10-CM

## 2018-06-06 MED ORDER — DIAZEPAM 5 MG PO TABS: ORAL_TABLET | ORAL | 0 refills | Status: DC

## 2018-06-06 NOTE — Telephone Encounter (Signed)
Authorization [id number] Auth Effective Date:06/13/2019Auth End Date:09/04/2018  Pt is scheduled for 07/04/18

## 2018-06-06 NOTE — Progress Notes (Signed)
.  Numeric Pain Rating Scale and Functional Assessment Average Pain 9   In the last MONTH (on 0-10 scale) has pain interfered with the following?  1. General activity like being  able to carry out your everyday physical activities such as walking, climbing stairs, carrying groceries, or moving a chair?  Rating(8)   +Driver, -BT, -Dye Allergies.  

## 2018-06-10 ENCOUNTER — Encounter (INDEPENDENT_AMBULATORY_CARE_PROVIDER_SITE_OTHER): Payer: Self-pay | Admitting: Physical Medicine and Rehabilitation

## 2018-06-10 ENCOUNTER — Ambulatory Visit: Payer: Medicare HMO | Admitting: Cardiology

## 2018-06-10 NOTE — Progress Notes (Signed)
Alan Knight. - 72 y.o. male MRN [id number]  Date of birth: September 29, 1946  Office Visit Note: Visit Date: 06/06/2018 PCP: Reynold Bowen, MD Referred by: Reynold Bowen, MD  Subjective: Chief Complaint  Patient presents with  . Lower Back - Pain   HPI: Alan Knight is a 72 year old gentleman accompanied by his wife who provides some of the history.  He is 4 weeks status post radiofrequency ablation procedure of the lumbar facet joints.  Unfortunately he has not gotten as much relief as he had hoped.  He felt like for 3 weeks it was doing fairly well and then he had decided maybe he was doing well enough to play some golf and essentially that same day or the next day it was severe pain once again.  He does state that overall he may be 30% better.  He continues to take tramadol twice a day once in the morning and once later own.  He does not abuse the medication has been taking it at the specified times and it does seem to help to a degree.  He reports first thing in the morning his pain is the worst trying to move around.  He rates this as a 9 out of 10.  It is functionally limiting right now it is not really going back to the gym anymore.  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  By way of review he has pretty significant lumbar degenerative change which is not really changed over time.  MRI from 2017 again reviewed with the patient and there is really been no other changes in terms of description of his pain other than just increasing severity and not being helped very much with radiofrequency ablation.  Also by way of review he had a left knee ablation done by Dr.Kevin Shute at [place] which is not very beneficial either.  He has chronic knee pain.  He does note that the ablation procedure that we performed was very painful although myself and my staff do not remember any  complaints really during the procedure itself.  The patient is fairly stoic but I guess afterwards complained about how it was 1 of the worst procedures she had performed.  He is having some early memory difficulties according to his wife.  I am not sure how much that is coming into play with an overall pain situation with him.  His primary care physician has started him on a sample of Lyrica.  I think this is wise.  He is also taking zonisamide for headache and migraine.  His wife feels like he was supposed to stop the zonisamide while he was taking the Lyrica but according to the patient he has been taking both.  While there is no harm in taking both they would be additive potential psychotropic side effect.  Again no real radicular complaints down the legs.  No focal weakness no new trauma.   Review of Systems  Constitutional: Negative for chills, fever, malaise/fatigue and weight loss.  HENT: Negative for hearing loss and sinus pain.   Eyes: Negative for blurred vision, double vision and photophobia.  Respiratory: Negative for cough and shortness of breath.   Cardiovascular: Negative for chest pain, palpitations and leg swelling.  Gastrointestinal: Negative for abdominal pain, nausea and vomiting.  Genitourinary: Negative for flank pain.  Musculoskeletal: Positive  for back pain and joint pain. Negative for myalgias.  Skin: Negative for itching and rash.  Neurological: Positive for headaches. Negative for tremors, focal weakness and weakness.  Endo/Heme/Allergies: Negative.   Psychiatric/Behavioral: Negative for depression.  All other systems reviewed and are negative.  Otherwise per HPI.  Assessment & Plan: Visit Diagnoses:  1. Spondylosis without myelopathy or radiculopathy, lumbar region   2. Chronic bilateral low back pain without sciatica   3. Spinal stenosis of lumbar region without neurogenic claudication   4. Scoliosis of lumbar spine, unspecified scoliosis type   5. Chronic pain  syndrome     Plan: Findings:  Chronic worsening severe mostly axial low back pain worse in the mornings and with activity.  Limiting what he can do at this point.  All over body pain to some degree.  Recently started on Lyrica by his primary care physician.  Radiofrequency ablation may be helped 30% but was a very painful procedure for him which is rare for that to happen we do those all the time and we discussed this with him.  Nonetheless he did have some pain during the procedure.  I think the next step in terms of his low back pain is to try a general interlaminar epidural steroid injection at the L4-5 level or L5-S1.  I would give him Valium preprocedure to see if some preprocedure sedation may help.  These injections are usually well-tolerated I do not feel like general IV sedation is really needed most of the time for injections but if that is the case and it helps and we can get that arranged.  I talked at length with the patient and his wife.  We spoke for more than 40 minutes about his condition.  I have counseled him on being active when he can and that he does not really have anything in the spine that would save not to be active other than just his pain level.  He also has pain in the knee joints as well.  I think him not working out makes things a little bit more frustrating and depressive for him.  Other complicated factors include the fact that he is now on a blood thinner and cannot really take any anti-inflammatories which he used to do quite frequently.  I have asked the patient's wife to talk to the primary care physician about the concurrent use of zonisamide with the Lyrica.  I have encouraged the patient to try to get up to twice a day on the Lyrica but this can be coordinated through his primary care physician.  They did talk about seeing someone in pain management.  They commented that the primary care physician felt like that someone in pain management would not see him.  I think at this  point hopefully we can avoid a lot of the hassle that if we can see how he does with one epidural injection which she has not had in the long time.  Next step would be updating his MRI of the lumbar spine.  Another avenue of pain control may be the use of duloxetine.  This may benefit him in both a pain standpoint and antidepressive standpoint.  There is FDA indication for back pain and knee pain without medication.    Meds & Orders:  Meds ordered this encounter  Medications  . diazepam (VALIUM) 5 MG tablet    Sig: Take 1 by mouth 1 to 2 hours pre-procedure. May repeat if necessary.    Dispense:  2  tablet    Refill:  0   No orders of the defined types were placed in this encounter.   Follow-up: Return for Interlaminar epidural steroid injection..   Procedures: No procedures performed  No notes on file   Clinical History: Lumbar spine MRI 05/31/2016  Disc levels:  Disc spaces: Degenerative disc disease with disc height loss throughout the lumbar spine.  T11-12: Minimal broad-based disc bulge. Bilateral facet arthropathy. No spinal or foraminal stenosis.  T12-L1: Broad-based disc bulge eccentric towards the left. Moderate left and mild right facet arthropathy. Moderate left foraminal stenosis. No central canal stenosis.  L1-L2: Broad-based disc bulge with a left lateral disc osteophyte complex. Mild bilateral facet arthropathy. Left lateral recess narrowing. Mild bilateral foraminal narrowing. No central canal stenosis.  L2-L3: Mild broad-based disc bulge. Mild bilateral facet arthropathy. No evidence of neural foraminal stenosis. No central canal stenosis.  L3-L4: Broad-based disc bulge with a a right lateral disc osteophyte complex. Moderate bilateral facet arthropathy. Right lateral recess stenosis. Moderate -severe foraminal stenosis. Mild spinal stenosis.  L4-L5: Broad-based disc bulge. Moderate bilateral facet arthropathy, right worse than left with ligamentum  flavum infolding. Moderate spinal stenosis. Severe right and mild-moderate left foraminal stenosis.  L5-S1: Mild broad-based disc bulge. Moderate bilateral facet arthropathy. Moderate right and mild left foraminal stenosis. No central canal stenosis.  IMPRESSION: 1. Diffuse degenerative disc disease and facet arthropathy as described above which is similar in appearance to the prior examination of 01/22/2014. 2. At L4-5 there is a broad-based disc bulge. Moderate bilateral facet arthropathy, right worse than left with ligamentum flavum infolding. Moderate spinal stenosis. Severe right and mild-moderate left foraminal stenosis.   He reports that he has never smoked. He has never used smokeless tobacco. No results for input(s): HGBA1C, LABURIC in the last 8760 hours.  Objective:  VS:  HT:    WT:   BMI:     BP:127/73  HR:(!) 58bpm  TEMP:98.2 F (36.8 C)(Oral)  RESP:98 % Physical Exam  Constitutional: He is oriented to person, place, and time. He appears well-developed and well-nourished. No distress.  HENT:  Head: Normocephalic and atraumatic.  Nose: Nose normal.  Mouth/Throat: Oropharynx is clear and moist.  Eyes: Pupils are equal, round, and reactive to light. Conjunctivae are normal.  Neck: Normal range of motion. Neck supple. No tracheal deviation present.  Cardiovascular: Regular rhythm and intact distal pulses.  Pulmonary/Chest: Effort normal and breath sounds normal.  Abdominal: Soft. He exhibits no distension. There is no rebound and no guarding.  Musculoskeletal: He exhibits no deformity.  Patient ambulates without aid.  He stands with a little bit of a forward flexed lumbar spine.  He is stiff the lumbar spine with pain on extension.  He has no pain with hip rotation internal or external.  No pain over the greater trochanters.  He has good distal strength.  Neurological: He is alert and oriented to person, place, and time. He exhibits normal muscle tone. Coordination  normal.  Skin: Skin is warm. No rash noted.  Psychiatric: He has a normal mood and affect. His behavior is normal.  Nursing note and vitals reviewed.   Ortho Exam Imaging: No results found.  Past Medical/Family/Surgical/Social History: Medications & Allergies reviewed per EMR, new medications updated. Patient Active Problem List   Diagnosis Date Noted  . Chronic pain of both knees 10/03/2017  . Chronic pain syndrome 10/03/2017  . Status post left knee replacement 10/03/2017  . Spondylosis without myelopathy or radiculopathy, lumbar region 05/17/2017  .  Chronic bilateral low back pain without sciatica 05/17/2017  . Chest tightness 02/21/2017  . Cough 02/21/2017  . Osteoarthritis 02/21/2017  . Primary osteoarthritis of left knee 11/09/2015  . S/P total knee replacement using cement 11/09/2015   Past Medical History:  Diagnosis Date  . Arthritis   . Basal cell carcinoma   . BPH (benign prostatic hypertrophy) with urinary obstruction   . Chest tightness 02/21/2017  . Chronic bilateral low back pain without sciatica 05/17/2017  . Chronic pain of both knees 10/03/2017  . Chronic pain syndrome 10/03/2017  . Cough 02/21/2017  . Diverticulosis   . Headache   . Hyperlipidemia   . Hypertension   . Hypogonadism male   . Internal hemorrhoids   . OA (osteoarthritis)   . Osteoarthritis 02/21/2017  . Paroxysmal atrial fibrillation (HCC)   . Primary osteoarthritis of left knee 11/09/2015  . S/P total knee replacement using cement 11/09/2015  . Spondylosis without myelopathy or radiculopathy, lumbar region 05/17/2017  . Status post left knee replacement 10/03/2017   Overview:  Added automatically from request for surgery [id number]   Family History  Problem Relation Age of Onset  . Heart attack Father   . CVA Brother    Past Surgical History:  Procedure Laterality Date  . ATRIAL FIBRILLATION ABLATION N/A 11/16/2017   Procedure: ATRIAL FIBRILLATION ABLATION;  Surgeon: Thompson Grayer, MD;   Location: [facility];  Service: Cardiovascular;  Laterality: N/A;  . CARPAL TUNNEL RELEASE     left  . HIP ARTHROPLASTY    . JOINT REPLACEMENT    . KNEE ARTHROPLASTY     right  . KNEE SURGERY    . SHOULDER SURGERY     bilateral  . TOTAL KNEE ARTHROPLASTY     left  . TOTAL KNEE ARTHROPLASTY Left 11/09/2015   Procedure: TOTAL KNEE ARTHROPLASTY;  Surgeon: Garald Balding, MD;  Location: [facility];  Service: Orthopedics;  Laterality: Left;   Social History   Occupational History  . Occupation: Sales  Tobacco Use  . Smoking status: Never Smoker  . Smokeless tobacco: Never Used  Substance and Sexual Activity  . Alcohol use: Yes    Comment: 1 mixed drink per day  . Drug use: No  . Sexual activity: Not on file

## 2018-06-11 ENCOUNTER — Encounter (HOSPITAL_BASED_OUTPATIENT_CLINIC_OR_DEPARTMENT_OTHER): Payer: Medicare HMO

## 2018-06-13 ENCOUNTER — Other Ambulatory Visit (INDEPENDENT_AMBULATORY_CARE_PROVIDER_SITE_OTHER): Payer: Self-pay | Admitting: Physical Medicine and Rehabilitation

## 2018-06-13 NOTE — Telephone Encounter (Signed)
Please Advise

## 2018-06-17 ENCOUNTER — Ambulatory Visit: Payer: Medicare HMO | Admitting: Internal Medicine

## 2018-06-17 ENCOUNTER — Encounter: Payer: Self-pay | Admitting: Internal Medicine

## 2018-06-17 ENCOUNTER — Ambulatory Visit (HOSPITAL_BASED_OUTPATIENT_CLINIC_OR_DEPARTMENT_OTHER): Payer: Medicare HMO | Attending: Cardiology | Admitting: Cardiology

## 2018-06-17 VITALS — BP 126/62 | HR 65 | Ht 70.0 in | Wt 164.0 lb

## 2018-06-17 DIAGNOSIS — R0683 Snoring: Secondary | ICD-10-CM

## 2018-06-17 DIAGNOSIS — G4733 Obstructive sleep apnea (adult) (pediatric): Secondary | ICD-10-CM | POA: Diagnosis not present

## 2018-06-17 DIAGNOSIS — R001 Bradycardia, unspecified: Secondary | ICD-10-CM

## 2018-06-17 DIAGNOSIS — I480 Paroxysmal atrial fibrillation: Secondary | ICD-10-CM | POA: Diagnosis not present

## 2018-06-17 NOTE — Progress Notes (Signed)
PCP: Reynold Bowen, MD   Primary EP: Dr Gerlene Burdock. is a 72 y.o. male who presents today for routine electrophysiology followup.  Since last being seen in our clinic, the patient reports doing very well.  Today, he denies symptoms of palpitations, chest pain, shortness of breath,  lower extremity edema, dizziness, presyncope, or syncope.  His primary concern is with backpain today.  The patient is otherwise without complaint today.   Past Medical History:  Diagnosis Date  . Arthritis   . Basal cell carcinoma   . BPH (benign prostatic hypertrophy) with urinary obstruction   . Chest tightness 02/21/2017  . Chronic bilateral low back pain without sciatica 05/17/2017  . Chronic pain of both knees 10/03/2017  . Chronic pain syndrome 10/03/2017  . Cough 02/21/2017  . Diverticulosis   . Headache   . Hyperlipidemia   . Hypertension   . Hypogonadism male   . Internal hemorrhoids   . OA (osteoarthritis)   . Osteoarthritis 02/21/2017  . Paroxysmal atrial fibrillation (HCC)   . Primary osteoarthritis of left knee 11/09/2015  . S/P total knee replacement using cement 11/09/2015  . Spondylosis without myelopathy or radiculopathy, lumbar region 05/17/2017  . Status post left knee replacement 10/03/2017   Overview:  Added automatically from request for surgery [id number]   Past Surgical History:  Procedure Laterality Date  . ATRIAL FIBRILLATION ABLATION N/A 11/16/2017   Procedure: ATRIAL FIBRILLATION ABLATION;  Surgeon: Thompson Grayer, MD;  Location: [facility];  Service: Cardiovascular;  Laterality: N/A;  . CARPAL TUNNEL RELEASE     left  . HIP ARTHROPLASTY    . JOINT REPLACEMENT    . KNEE ARTHROPLASTY     right  . KNEE SURGERY    . SHOULDER SURGERY     bilateral  . TOTAL KNEE ARTHROPLASTY     left  . TOTAL KNEE ARTHROPLASTY Left 11/09/2015   Procedure: TOTAL KNEE ARTHROPLASTY;  Surgeon: Garald Balding, MD;  Location: [facility];  Service: Orthopedics;  Laterality:  Left;    ROS- all systems are reviewed and negatives except as per HPI above  Current Outpatient Medications  Medication Sig Dispense Refill  . acetaminophen (TYLENOL) 325 MG tablet Take 650 mg every 6 (six) hours as needed by mouth for moderate pain or headache.    Marland Kitchen apixaban (ELIQUIS) 5 MG TABS tablet Take 1 tablet (5 mg total) by mouth 2 (two) times daily. 180 tablet 1  . Ascorbic Acid (VITAMIN C) 1000 MG tablet Take 1,000 mg by mouth daily.    . calcium acetate (PHOSLO) 667 MG capsule Take 500 mg by mouth daily.    . Cholecalciferol (VITAMIN D3) 5000 UNITS CAPS Take 5,000 Units daily by mouth.     . co-enzyme Q-10 30 MG capsule Take 300 mg by mouth daily.    . diazepam (VALIUM) 5 MG tablet Take 1 by mouth 1 to 2 hours pre-procedure. May repeat if necessary. 2 tablet 0  . glucosamine-chondroitin 500-400 MG tablet Take 1 tablet by mouth daily.    Marland Kitchen lisinopril (PRINIVIL,ZESTRIL) 5 MG tablet Take 5 mg by mouth daily.    . Multiple Minerals-Vitamins (CAL-MAG-ZINC-D) TABS Take 1 tablet daily by mouth.    . naproxen (NAPROSYN) 500 MG tablet Take 500 mg by mouth 2 (two) times a week.    . simvastatin (ZOCOR) 40 MG tablet Take 40 mg by mouth daily.    . traMADol (ULTRAM) 50 MG tablet TAKE 1 TABLET BY  MOUTH EVERY 12 HOURS AS NEEDED 30 tablet 0  . LYRICA 50 MG capsule Take 50 mg by mouth 2 (two) times daily.  0   No current facility-administered medications for this visit.     Physical Exam: Vitals:   06/17/18 0840  BP: 126/62  Pulse: 65  Weight: 164 lb (74.4 kg)  Height: 5\' 10"  (1.778 m)    GEN- The patient is well appearing, alert and oriented x 3 today.   Head- normocephalic, atraumatic Eyes-  Sclera clear, conjunctiva pink Ears- hearing intact Oropharynx- clear Lungs- Clear to ausculation bilaterally, normal work of breathing Heart- Regular rate and rhythm, no murmurs, rubs or gallops, PMI not laterally displaced GI- soft, NT, ND, + BS Extremities- no clubbing, cyanosis, or  edema  Wt Readings from Last 3 Encounters:  06/17/18 164 lb (74.4 kg)  02/18/18 162 lb (73.5 kg)  12/12/17 165 lb (74.8 kg)    EKG tracing ordered today is personally reviewed and shows sinus rhythm 65 bpm, PACs  Assessment and Plan:  1. Paroxysmal atrial fibrillaqtion Doing well s/p ablation off AAD therapy On eliquis for chads2vasc score of 2. Worries about using NSAIDs and would like to stop anticoagulation. For now, I have encouraged him to continue eliquis.  Will consider ILR to guide anticoagulation if he remains in sinus clinically.  We also discussed REACT AF and he would be interested in this trial if it becomes an option.  2. Sinus bradycardia Improved post ablation  3. Snoring  Scheduled for CPAP titration tomorrow Worries that he cannot tolerate CPAP and asks about dental appliance.  I have advised that he give CPAP a good try first and follow-up with Dr Radford Pax.  Return in 4 months  Thompson Grayer MD, [facility] 06/17/2018 8:49 AM

## 2018-06-17 NOTE — Patient Instructions (Addendum)
Medication Instructions:  Your physician recommends that you continue on your current medications as directed. Please refer to the Current Medication list given to you today.  Labwork: None ordered  Testing/Procedures: None ordered  Follow-Up: Your physician recommends that you schedule a follow-up appointment in: 4 months with Dr. Rayann Heman.   * If you need a refill on your cardiac medications before your next appointment, please call your pharmacy.   *Please note that any paperwork needing to be filled out by the provider will need to be addressed at the front desk prior to seeing the provider. Please note that any FMLA, disability or other documents regarding health condition is subject to a $25.00 charge that must be received prior to completion of paperwork in the form of a money order or check.  Thank you for choosing [facility]!!

## 2018-06-18 ENCOUNTER — Encounter (INDEPENDENT_AMBULATORY_CARE_PROVIDER_SITE_OTHER): Payer: Self-pay | Admitting: Physical Medicine and Rehabilitation

## 2018-06-18 ENCOUNTER — Encounter

## 2018-06-18 ENCOUNTER — Ambulatory Visit (INDEPENDENT_AMBULATORY_CARE_PROVIDER_SITE_OTHER): Payer: Self-pay

## 2018-06-18 ENCOUNTER — Ambulatory Visit (INDEPENDENT_AMBULATORY_CARE_PROVIDER_SITE_OTHER): Payer: Medicare HMO | Admitting: Physical Medicine and Rehabilitation

## 2018-06-18 VITALS — BP 125/82 | HR 69

## 2018-06-18 DIAGNOSIS — M48061 Spinal stenosis, lumbar region without neurogenic claudication: Secondary | ICD-10-CM | POA: Diagnosis not present

## 2018-06-18 MED ORDER — METHYLPREDNISOLONE ACETATE 80 MG/ML IJ SUSP
80.0000 mg | Freq: Once | INTRAMUSCULAR | Status: AC
  Administered 2018-06-18: 80 mg

## 2018-06-18 NOTE — Procedures (Signed)
    Patient Name: Alan Knight, Alan Knight Date: 06/17/2018 Gender: Male D.O.B: December 23, 1946 Age (years): 35 Referring Provider: Fransico Him MD, ABSM Height (inches): 71 Interpreting Physician: Fransico Him MD, ABSM Weight (lbs): 167 RPSGT: Jorge Ny BMI: 23 MRN: [id number] Neck Size: 16.50  CLINICAL INFORMATION The patient is referred for a CPAP titration to treat sleep apnea.  SLEEP STUDY TECHNIQUE As per the AASM Manual for the Scoring of Sleep and Associated Events v2.3 (April 2016) with a hypopnea requiring 4% desaturations.  The channels recorded and monitored were frontal, central and occipital EEG, electrooculogram (EOG), submentalis EMG (chin), nasal and oral airflow, thoracic and abdominal wall motion, anterior tibialis EMG, snore microphone, electrocardiogram, and pulse oximetry. Continuous positive airway pressure (CPAP) was initiated at the beginning of the study and titrated to treat sleep-disordered breathing.  MEDICATIONS Medications self-administered by patient taken the night of the study : N/A  TECHNICIAN COMMENTS Comments added by technician: NONE Comments added by scorer: N/A  RESPIRATORY PARAMETERS Optimal PAP Pressure (cm): N/A  AHI at Optimal Pressure (/hr):N/A Overall Minimal O2 (%):88.0  Supine % at Optimal Pressure (%): N/A Minimal O2 at Optimal Pressure (%): 88.0   SLEEP ARCHITECTURE The study was initiated at 10:27:51 PM and ended at 5:02:10 AM.  Sleep onset time was 15.9 minutes and the sleep efficiency was 52.9%%. The total sleep time was 208.5 minutes.  The patient spent 7.4%% of the night in stage N1 sleep, 71.9%% in stage N2 sleep, 0.0%% in stage N3 and 20.62% in REM.Stage REM latency was 64.5 minutes  Wake after sleep onset was 170.0. Alpha intrusion was absent. Supine sleep was 100.00%.  CARDIAC DATA The 2 lead EKG demonstrated sinus rhythm. The mean heart rate was 50.6 beats per minute. Other EKG findings include: PVCs and PACs,  nonsustained atrial tachycardia  LEG MOVEMENT DATA The total Periodic Limb Movements of Sleep (PLMS) were 0. The PLMS index was 0.0. A PLMS index of <15 is considered normal in adults.  IMPRESSIONS - An optimal PAP pressure could not be selected for this patient based on the available study data due to ongoing respiratory events. - Central sleep apnea was not noted during this titration (CAI = 1.4/h). - Mild oxygen desaturations were observed during this titration (min O2 = 88.0%). - The patient snored with moderate snoring volume during this titration study. - 2-lead EKG demonstrated: PVCs, PACs and nonsustained atrial tachycardia - Clinically significant periodic limb movements were not noted during this study. Arousals associated with PLMs were rare.  DIAGNOSIS - Obstructive Sleep Apnea (327.23 [G47.33 ICD-10])  RECOMMENDATIONS - Recommend BiPAP titration in lab due to ongoing respiratory events with CPAP. - Avoid alcohol, sedatives and other CNS depressants that may worsen sleep apnea and disrupt normal sleep architecture. - Sleep hygiene should be reviewed to assess factors that may improve sleep quality. - Weight management and regular exercise should be initiated or continued.  [Electronically signed] 06/18/2018 03:03 PM  Fransico Him MD, ABSM Diplomate, American Board of Sleep Medicine

## 2018-06-18 NOTE — Patient Instructions (Signed)

## 2018-06-18 NOTE — Progress Notes (Signed)
Pt states pain in lower back. Pt states pain is there all the time, ice pack, tylenol and tramadol seems to calm pain down a little.   .Numeric Pain Rating Scale and Functional Assessment Average Pain 4   In the last MONTH (on 0-10 scale) has pain interfered with the following?  1. General activity like being  able to carry out your everyday physical activities such as walking, climbing stairs, carrying groceries, or moving a chair?  Rating(3)   +Driver, +BT(held elquis 2 day prior), -Dye Allergies.

## 2018-06-19 ENCOUNTER — Telehealth: Payer: Self-pay | Admitting: *Deleted

## 2018-06-19 DIAGNOSIS — G4733 Obstructive sleep apnea (adult) (pediatric): Secondary | ICD-10-CM

## 2018-06-19 NOTE — Telephone Encounter (Signed)
-----   Message from Sueanne Margarita, MD sent at 06/18/2018  3:14 PM EDT ----- Please let patient know they had an unsuccessful CPAP titration due to severity of sleep apnea.  Please set up in lab BiPAP titration.

## 2018-06-20 NOTE — Progress Notes (Signed)
[name] - 72 y.o. male MRN [id number]  Date of birth: 11-21-1946  Office Visit Note: Visit Date: 06/18/2018 PCP: Reynold Bowen, MD Referred by: Reynold Bowen, MD  Subjective: Chief Complaint  Patient presents with  . Lower Back - Pain   HPI: Mr. Alan Knight is a 72 year old gentleman that comes in today for planned left L4-5 interlaminar epidural steroid injection.  Please see our prior notes for evaluation management and justification.  We did provide him with Valium for today since the last procedure was somewhat painful and he does have some difficulty laying in position although in the past he is done quite well.  His wife decided that the Valium was probably not a good idea today and he did not take that.  He still should do okay as he is done in the past.  He will continue to take tramadol as needed we are providing that prescription.  He is taking this reasonably without any sinister pattern.   ROS Otherwise per HPI.  Assessment & Plan: Visit Diagnoses:  1. Spinal stenosis of lumbar region without neurogenic claudication     Plan: No additional findings.   Meds & Orders:  Meds ordered this encounter  Medications  . methylPREDNISolone acetate (DEPO-MEDROL) injection 80 mg    Orders Placed This Encounter  Procedures  . XR C-ARM NO REPORT  . Epidural Steroid injection    Follow-up: Return in about 3 weeks (around 07/09/2018).   Procedures: No procedures performed  Lumbar Epidural Steroid Injection - Interlaminar Approach with Fluoroscopic Guidance  Patient: Alan Knight.      Date of Birth: 1946/03/22 MRN: [id number] PCP: Reynold Bowen, MD      Visit Date: 06/18/2018   Universal Protocol:     Consent Given By: the patient  Position: PRONE  Additional Comments: Vital signs were monitored before and after the procedure. Patient was prepped and draped in the usual sterile fashion. The correct patient, procedure, and site was verified.   Injection  Procedure Details:  Procedure Site One Meds Administered:  Meds ordered this encounter  Medications  . methylPREDNISolone acetate (DEPO-MEDROL) injection 80 mg     Laterality: Left  Location/Site:  L4-L5  Needle size: 20 G  Needle type: Tuohy  Needle Placement: Paramedian epidural  Findings:   -Comments: Excellent flow of contrast into the epidural space.  Procedure Details: Using a paramedian approach from the side mentioned above, the region overlying the inferior lamina was localized under fluoroscopic visualization and the soft tissues overlying this structure were infiltrated with 4 ml. of 1% Lidocaine without Epinephrine. The Tuohy needle was inserted into the epidural space using a paramedian approach.   The epidural space was localized using loss of resistance along with lateral and bi-planar fluoroscopic views.  After negative aspirate for air, blood, and CSF, a 2 ml. volume of Isovue-250 was injected into the epidural space and the flow of contrast was observed. Radiographs were obtained for documentation purposes.    The injectate was administered into the level noted above.   Additional Comments:  The patient tolerated the procedure well Dressing: Band-Aid    Post-procedure details: Patient was observed during the procedure. Post-procedure instructions were reviewed.  Patient left the clinic in stable condition.   Clinical History: Lumbar spine MRI 05/31/2016  Disc levels:  Disc spaces: Degenerative disc disease with disc height loss throughout the lumbar spine.  T11-12: Minimal broad-based disc bulge. Bilateral facet arthropathy. No spinal or foraminal stenosis.  T12-L1:  Broad-based disc bulge eccentric towards the left. Moderate left and mild right facet arthropathy. Moderate left foraminal stenosis. No central canal stenosis.  L1-L2: Broad-based disc bulge with a left lateral disc osteophyte complex. Mild bilateral facet arthropathy. Left  lateral recess narrowing. Mild bilateral foraminal narrowing. No central canal stenosis.  L2-L3: Mild broad-based disc bulge. Mild bilateral facet arthropathy. No evidence of neural foraminal stenosis. No central canal stenosis.  L3-L4: Broad-based disc bulge with a a right lateral disc osteophyte complex. Moderate bilateral facet arthropathy. Right lateral recess stenosis. Moderate -severe foraminal stenosis. Mild spinal stenosis.  L4-L5: Broad-based disc bulge. Moderate bilateral facet arthropathy, right worse than left with ligamentum flavum infolding. Moderate spinal stenosis. Severe right and mild-moderate left foraminal stenosis.  L5-S1: Mild broad-based disc bulge. Moderate bilateral facet arthropathy. Moderate right and mild left foraminal stenosis. No central canal stenosis.  IMPRESSION: 1. Diffuse degenerative disc disease and facet arthropathy as described above which is similar in appearance to the prior examination of 01/22/2014. 2. At L4-5 there is a broad-based disc bulge. Moderate bilateral facet arthropathy, right worse than left with ligamentum flavum infolding. Moderate spinal stenosis. Severe right and mild-moderate left foraminal stenosis.   He reports that he has never smoked. He has never used smokeless tobacco. No results for input(s): HGBA1C, LABURIC in the last 8760 hours.  Objective:  VS:  HT:    WT:   BMI:     BP:125/82  HR:69bpm  TEMP: ( )  RESP:  Physical Exam  Ortho Exam Imaging: No results found.  Past Medical/Family/Surgical/Social History: Medications & Allergies reviewed per EMR, new medications updated. Patient Active Problem List   Diagnosis Date Noted  . Chronic pain of both knees 10/03/2017  . Chronic pain syndrome 10/03/2017  . Status post left knee replacement 10/03/2017  . Spondylosis without myelopathy or radiculopathy, lumbar region 05/17/2017  . Chronic bilateral low back pain without sciatica 05/17/2017  . Chest  tightness 02/21/2017  . Cough 02/21/2017  . Osteoarthritis 02/21/2017  . Primary osteoarthritis of left knee 11/09/2015  . S/P total knee replacement using cement 11/09/2015   Past Medical History:  Diagnosis Date  . Arthritis   . Basal cell carcinoma   . BPH (benign prostatic hypertrophy) with urinary obstruction   . Chest tightness 02/21/2017  . Chronic bilateral low back pain without sciatica 05/17/2017  . Chronic pain of both knees 10/03/2017  . Chronic pain syndrome 10/03/2017  . Cough 02/21/2017  . Diverticulosis   . Headache   . Hyperlipidemia   . Hypertension   . Hypogonadism male   . Internal hemorrhoids   . OA (osteoarthritis)   . Osteoarthritis 02/21/2017  . Paroxysmal atrial fibrillation (HCC)   . Primary osteoarthritis of left knee 11/09/2015  . S/P total knee replacement using cement 11/09/2015  . Spondylosis without myelopathy or radiculopathy, lumbar region 05/17/2017  . Status post left knee replacement 10/03/2017   Overview:  Added automatically from request for surgery [id number]   Family History  Problem Relation Age of Onset  . Heart attack Father   . CVA Brother    Past Surgical History:  Procedure Laterality Date  . ATRIAL FIBRILLATION ABLATION N/A 11/16/2017   Procedure: ATRIAL FIBRILLATION ABLATION;  Surgeon: Thompson Grayer, MD;  Location: [place] [facility];  Service: Cardiovascular;  Laterality: N/A;  . CARPAL TUNNEL RELEASE     left  . HIP ARTHROPLASTY    . JOINT REPLACEMENT    . KNEE ARTHROPLASTY     right  .  KNEE SURGERY    . SHOULDER SURGERY     bilateral  . TOTAL KNEE ARTHROPLASTY     left  . TOTAL KNEE ARTHROPLASTY Left 11/09/2015   Procedure: TOTAL KNEE ARTHROPLASTY;  Surgeon: Garald Balding, MD;  Location: [place];  Service: Orthopedics;  Laterality: Left;   Social History   Occupational History  . Occupation: Sales  Tobacco Use  . Smoking status: Never Smoker  . Smokeless tobacco: Never Used  Substance and Sexual Activity  .  Alcohol use: Yes    Comment: 1 mixed drink per day  . Drug use: No  . Sexual activity: Not on file

## 2018-06-20 NOTE — Telephone Encounter (Signed)
Called results LMTCB 

## 2018-06-20 NOTE — Procedures (Signed)
Lumbar Epidural Steroid Injection - Interlaminar Approach with Fluoroscopic Guidance  Patient: Alan Knight.      Date of Birth: [birth year] MRN: [id number] PCP: Reynold Bowen, MD      Visit Date: 06/18/2018   Universal Protocol:     Consent Given By: the patient  Position: PRONE  Additional Comments: Vital signs were monitored before and after the procedure. Patient was prepped and draped in the usual sterile fashion. The correct patient, procedure, and site was verified.   Injection Procedure Details:  Procedure Site One Meds Administered:  Meds ordered this encounter  Medications  . methylPREDNISolone acetate (DEPO-MEDROL) injection 80 mg     Laterality: Left  Location/Site:  L4-L5  Needle size: 20 G  Needle type: Tuohy  Needle Placement: Paramedian epidural  Findings:   -Comments: Excellent flow of contrast into the epidural space.  Procedure Details: Using a paramedian approach from the side mentioned above, the region overlying the inferior lamina was localized under fluoroscopic visualization and the soft tissues overlying this structure were infiltrated with 4 ml. of 1% Lidocaine without Epinephrine. The Tuohy needle was inserted into the epidural space using a paramedian approach.   The epidural space was localized using loss of resistance along with lateral and bi-planar fluoroscopic views.  After negative aspirate for air, blood, and CSF, a 2 ml. volume of Isovue-250 was injected into the epidural space and the flow of contrast was observed. Radiographs were obtained for documentation purposes.    The injectate was administered into the level noted above.   Additional Comments:  The patient tolerated the procedure well Dressing: Band-Aid    Post-procedure details: Patient was observed during the procedure. Post-procedure instructions were reviewed.  Patient left the clinic in stable condition.

## 2018-06-25 ENCOUNTER — Other Ambulatory Visit (INDEPENDENT_AMBULATORY_CARE_PROVIDER_SITE_OTHER): Payer: Self-pay | Admitting: Physical Medicine and Rehabilitation

## 2018-06-25 NOTE — Telephone Encounter (Signed)
Informed patient of ttitration study results and patient understanding was verbalized. Patient understands his sleep study showed  they had an unsuccessful CPAP titration due to severity of sleep apnea and set up in lab BiPAP titration. Pt is aware and agreeable to these results.

## 2018-06-26 ENCOUNTER — Telehealth: Payer: Self-pay | Admitting: *Deleted

## 2018-06-26 NOTE — Telephone Encounter (Signed)
-----   Message from Freada Bergeron, [place] sent at 06/25/2018  5:05 PM EDT ----- Regarding: pre cert Bipap titration

## 2018-06-26 NOTE — Telephone Encounter (Signed)
Staff message sent to [facility] approval received. Ok to schedule BIPAP titration. Information given to assign referral.

## 2018-06-28 NOTE — Telephone Encounter (Signed)
Please advise 

## 2018-07-01 ENCOUNTER — Telehealth: Payer: Self-pay | Admitting: *Deleted

## 2018-07-01 NOTE — Telephone Encounter (Signed)
Referral assigned

## 2018-07-01 NOTE — Telephone Encounter (Signed)
-----   Message from Lauralee Evener, [place] sent at 06/26/2018 11:40 AM EDT ----- Regarding: RE: pre cert [employer] approval received. Ok to schedule BIPAP titration.  Auth # [id number]   Vallid 06/26/18 to 09/24/18. ----- Message ----- From: Freada Bergeron, CMA Sent: 06/25/2018   5:05 PM To: Cv Div Sleep Studies Subject: pre cert                                       Bipap titration

## 2018-07-01 NOTE — Telephone Encounter (Signed)
Patient is scheduled for lab study on 07/25/18. Patient understands his sleep study will be done at [facility] sleep lab. Patient understands he will receive a sleep packet in a week or so. Patient understands to call if he does not receive the sleep packet in a timely manner. Patient agrees with treatment and thanked me for call.

## 2018-07-02 ENCOUNTER — Encounter: Payer: Self-pay | Admitting: *Deleted

## 2018-07-04 ENCOUNTER — Encounter (INDEPENDENT_AMBULATORY_CARE_PROVIDER_SITE_OTHER): Payer: Self-pay | Admitting: Physical Medicine and Rehabilitation

## 2018-07-04 ENCOUNTER — Other Ambulatory Visit (HOSPITAL_COMMUNITY): Payer: Self-pay | Admitting: Nurse Practitioner

## 2018-07-08 DIAGNOSIS — R82998 Other abnormal findings in urine: Secondary | ICD-10-CM | POA: Diagnosis not present

## 2018-07-08 DIAGNOSIS — Z125 Encounter for screening for malignant neoplasm of prostate: Secondary | ICD-10-CM | POA: Diagnosis not present

## 2018-07-08 DIAGNOSIS — E7849 Other hyperlipidemia: Secondary | ICD-10-CM | POA: Diagnosis not present

## 2018-07-08 DIAGNOSIS — R7309 Other abnormal glucose: Secondary | ICD-10-CM | POA: Diagnosis not present

## 2018-07-09 ENCOUNTER — Telehealth (INDEPENDENT_AMBULATORY_CARE_PROVIDER_SITE_OTHER): Payer: Self-pay | Admitting: Physical Medicine and Rehabilitation

## 2018-07-09 NOTE — Telephone Encounter (Signed)
Pt with 50% relief after interlaminar esi can see if [facility] will approve repeat ok. Do not cancel appoint though in case they deny so we can document at ov follow up

## 2018-07-09 NOTE — Telephone Encounter (Signed)
Can you try to get auth for a repeat [phone]? I have not called his wife yet to tell her we will do an injection if we get auth.

## 2018-07-11 ENCOUNTER — Other Ambulatory Visit (INDEPENDENT_AMBULATORY_CARE_PROVIDER_SITE_OTHER): Payer: Self-pay | Admitting: Physical Medicine and Rehabilitation

## 2018-07-11 ENCOUNTER — Telehealth (INDEPENDENT_AMBULATORY_CARE_PROVIDER_SITE_OTHER): Payer: Self-pay | Admitting: *Deleted

## 2018-07-11 NOTE — Telephone Encounter (Signed)
Please advise 

## 2018-07-11 NOTE — Telephone Encounter (Signed)
I will approve but rx is written for two/day and last refill was 10 days ago. Will be seeing him on the 24th

## 2018-07-11 NOTE — Telephone Encounter (Signed)
Called pt and spoke with wife and advised that pt needs to hold eliquis 3 days prior to appt. Changed appt note to esi not ov.

## 2018-07-11 NOTE — Telephone Encounter (Signed)
Patient with diagnosis of Afib on Eliquis for anticoagulation.    Procedure: spinal epidural Date of procedure: TBD  CHADS2-VASc score of  2 (CHF, HTN, AGE, DM2, stroke/tia x 2, CAD, AGE, male)  CrCl 21ml/min  Per office protocol, patient can hold Eliquis for 3 days prior to procedure.

## 2018-07-15 DIAGNOSIS — E7849 Other hyperlipidemia: Secondary | ICD-10-CM | POA: Diagnosis not present

## 2018-07-15 DIAGNOSIS — Z0000 Encounter for general adult medical examination without abnormal findings: Secondary | ICD-10-CM | POA: Diagnosis not present

## 2018-07-15 DIAGNOSIS — G4733 Obstructive sleep apnea (adult) (pediatric): Secondary | ICD-10-CM | POA: Diagnosis not present

## 2018-07-15 DIAGNOSIS — Z6824 Body mass index (BMI) 24.0-24.9, adult: Secondary | ICD-10-CM | POA: Diagnosis not present

## 2018-07-15 DIAGNOSIS — I480 Paroxysmal atrial fibrillation: Secondary | ICD-10-CM | POA: Diagnosis not present

## 2018-07-15 DIAGNOSIS — N401 Enlarged prostate with lower urinary tract symptoms: Secondary | ICD-10-CM | POA: Diagnosis not present

## 2018-07-15 DIAGNOSIS — R7309 Other abnormal glucose: Secondary | ICD-10-CM | POA: Diagnosis not present

## 2018-07-15 DIAGNOSIS — G894 Chronic pain syndrome: Secondary | ICD-10-CM | POA: Diagnosis not present

## 2018-07-15 DIAGNOSIS — K219 Gastro-esophageal reflux disease without esophagitis: Secondary | ICD-10-CM | POA: Diagnosis not present

## 2018-07-15 DIAGNOSIS — G43909 Migraine, unspecified, not intractable, without status migrainosus: Secondary | ICD-10-CM | POA: Diagnosis not present

## 2018-07-16 DIAGNOSIS — Z1212 Encounter for screening for malignant neoplasm of rectum: Secondary | ICD-10-CM | POA: Diagnosis not present

## 2018-07-17 ENCOUNTER — Ambulatory Visit (INDEPENDENT_AMBULATORY_CARE_PROVIDER_SITE_OTHER): Payer: Self-pay

## 2018-07-17 ENCOUNTER — Encounter (INDEPENDENT_AMBULATORY_CARE_PROVIDER_SITE_OTHER): Payer: Self-pay | Admitting: Physical Medicine and Rehabilitation

## 2018-07-17 ENCOUNTER — Ambulatory Visit (INDEPENDENT_AMBULATORY_CARE_PROVIDER_SITE_OTHER): Payer: Medicare HMO | Admitting: Physical Medicine and Rehabilitation

## 2018-07-17 VITALS — BP 125/76 | HR 55

## 2018-07-17 DIAGNOSIS — G8929 Other chronic pain: Secondary | ICD-10-CM

## 2018-07-17 DIAGNOSIS — G894 Chronic pain syndrome: Secondary | ICD-10-CM

## 2018-07-17 DIAGNOSIS — M48061 Spinal stenosis, lumbar region without neurogenic claudication: Secondary | ICD-10-CM

## 2018-07-17 DIAGNOSIS — M545 Low back pain, unspecified: Secondary | ICD-10-CM

## 2018-07-17 MED ORDER — METHYLPREDNISOLONE ACETATE 80 MG/ML IJ SUSP
80.0000 mg | Freq: Once | INTRAMUSCULAR | Status: AC
  Administered 2018-07-17: 80 mg

## 2018-07-17 NOTE — Patient Instructions (Signed)

## 2018-07-17 NOTE — Procedures (Signed)
Lumbar Epidural Steroid Injection - Interlaminar Approach with Fluoroscopic Guidance  Patient: Alan Knight.      Date of Birth: 02-14-1946 MRN: [id number] PCP: Reynold Bowen, MD      Visit Date: 07/17/2018   Universal Protocol:     Consent Given By: the patient  Position: PRONE  Additional Comments: Vital signs were monitored before and after the procedure. Patient was prepped and draped in the usual sterile fashion. The correct patient, procedure, and site was verified.   Injection Procedure Details:  Procedure Site One Meds Administered:  Meds ordered this encounter  Medications  . methylPREDNISolone acetate (DEPO-MEDROL) injection 80 mg     Laterality: Left  Location/Site:  L4-L5  Needle size: 20 G  Needle type: Tuohy  Needle Placement: Paramedian epidural  Findings:   -Comments: Excellent flow of contrast into the epidural space.  Procedure Details: Using a paramedian approach from the side mentioned above, the region overlying the inferior lamina was localized under fluoroscopic visualization and the soft tissues overlying this structure were infiltrated with 4 ml. of 1% Lidocaine without Epinephrine. The Tuohy needle was inserted into the epidural space using a paramedian approach.   The epidural space was localized using loss of resistance along with lateral and bi-planar fluoroscopic views.  After negative aspirate for air, blood, and CSF, a 2 ml. volume of Isovue-250 was injected into the epidural space and the flow of contrast was observed. Radiographs were obtained for documentation purposes.    The injectate was administered into the level noted above.   Additional Comments:  The patient tolerated the procedure well Dressing: Band-Aid    Post-procedure details: Patient was observed during the procedure. Post-procedure instructions were reviewed.  Patient left the clinic in stable condition.

## 2018-07-17 NOTE — Progress Notes (Signed)
.  Numeric Pain Rating Scale and Functional Assessment Average Pain 7   In the last MONTH (on 0-10 scale) has pain interfered with the following?  1. General activity like being  able to carry out your everyday physical activities such as walking, climbing stairs, carrying groceries, or moving a chair?  Rating(3)   +Driver, +BT(stopped eliquis 07/14/18), -Dye Allergies.

## 2018-07-17 NOTE — Progress Notes (Signed)
Alan Knight. - 72 y.o. male MRN [id number]  Date of birth: [birth year]  Office Visit Note: Visit Date: 07/17/2018 PCP: Reynold Bowen, MD Referred by: Reynold Bowen, MD  Subjective: Chief Complaint  Patient presents with  . Lower Back - Pain   HPI: Alan Knight is a 72 year old gentleman quite well-known to [place] over the last several months with with chronic worsening back pain.  He comes in today status post interlaminar epidural steroid injection with more than 50% relief of his symptoms and pretty well pleased with the amount of relief that he got.  Unfortunately still taking tramadol twice daily and not doing as much activity as he is used to doing.  He still has more pain with exercising and moving around and he rates his pain as a 7 out of 10.  I am still unsure why the pain level is flared up as it is.  We have MRI from 2017 with spondylosis and stenosis and really significant degenerative change but has had this for quite some time.  Depending on relief of the injection we would update MRI of the lumbar spine.   ROS Otherwise per HPI.  Assessment & Plan: Visit Diagnoses:  1. Spinal stenosis of lumbar region without neurogenic claudication   2. Chronic bilateral low back pain without sciatica   3. Chronic pain syndrome     Plan: No additional findings.   Meds & Orders:  Meds ordered this encounter  Medications  . methylPREDNISolone acetate (DEPO-MEDROL) injection 80 mg    Orders Placed This Encounter  Procedures  . XR C-ARM NO REPORT  . Epidural Steroid injection    Follow-up: Return in about 3 weeks (around 08/07/2018).   Procedures: No procedures performed  Lumbar Epidural Steroid Injection - Interlaminar Approach with Fluoroscopic Guidance  Patient: Alan Knight.      Date of Birth: 72-17-1947 MRN: [id number] PCP: Reynold Bowen, MD      Visit Date: 07/17/2018   Universal Protocol:     Consent Given By: the patient  Position: PRONE  Additional  Comments: Vital signs were monitored before and after the procedure. Patient was prepped and draped in the usual sterile fashion. The correct patient, procedure, and site was verified.   Injection Procedure Details:  Procedure Site One Meds Administered:  Meds ordered this encounter  Medications  . methylPREDNISolone acetate (DEPO-MEDROL) injection 80 mg     Laterality: Left  Location/Site:  L4-L5  Needle size: 20 G  Needle type: Tuohy  Needle Placement: Paramedian epidural  Findings:   -Comments: Excellent flow of contrast into the epidural space.  Procedure Details: Using a paramedian approach from the side mentioned above, the region overlying the inferior lamina was localized under fluoroscopic visualization and the soft tissues overlying this structure were infiltrated with 4 ml. of 1% Lidocaine without Epinephrine. The Tuohy needle was inserted into the epidural space using a paramedian approach.   The epidural space was localized using loss of resistance along with lateral and bi-planar fluoroscopic views.  After negative aspirate for air, blood, and CSF, a 2 ml. volume of Isovue-250 was injected into the epidural space and the flow of contrast was observed. Radiographs were obtained for documentation purposes.    The injectate was administered into the level noted above.   Additional Comments:  The patient tolerated the procedure well Dressing: Band-Aid    Post-procedure details: Patient was observed during the procedure. Post-procedure instructions were reviewed.  Patient left the clinic in stable  condition.   Clinical History: Lumbar spine MRI 05/31/2016  Disc levels:  Disc spaces: Degenerative disc disease with disc height loss throughout the lumbar spine.  T11-12: Minimal broad-based disc bulge. Bilateral facet arthropathy. No spinal or foraminal stenosis.  T12-L1: Broad-based disc bulge eccentric towards the left. Moderate left and mild right  facet arthropathy. Moderate left foraminal stenosis. No central canal stenosis.  L1-L2: Broad-based disc bulge with a left lateral disc osteophyte complex. Mild bilateral facet arthropathy. Left lateral recess narrowing. Mild bilateral foraminal narrowing. No central canal stenosis.  L2-L3: Mild broad-based disc bulge. Mild bilateral facet arthropathy. No evidence of neural foraminal stenosis. No central canal stenosis.  L3-L4: Broad-based disc bulge with a a right lateral disc osteophyte complex. Moderate bilateral facet arthropathy. Right lateral recess stenosis. Moderate -severe foraminal stenosis. Mild spinal stenosis.  L4-L5: Broad-based disc bulge. Moderate bilateral facet arthropathy, right worse than left with ligamentum flavum infolding. Moderate spinal stenosis. Severe right and mild-moderate left foraminal stenosis.  L5-S1: Mild broad-based disc bulge. Moderate bilateral facet arthropathy. Moderate right and mild left foraminal stenosis. No central canal stenosis.  IMPRESSION: 1. Diffuse degenerative disc disease and facet arthropathy as described above which is similar in appearance to the prior examination of 01/22/2014. 2. At L4-5 there is a broad-based disc bulge. Moderate bilateral facet arthropathy, right worse than left with ligamentum flavum infolding. Moderate spinal stenosis. Severe right and mild-moderate left foraminal stenosis.   He reports that he has never smoked. He has never used smokeless tobacco. No results for input(s): HGBA1C, LABURIC in the last 8760 hours.  Objective:  VS:  HT:    WT:   BMI:     BP:125/76  HR:(!) 55bpm  TEMP: ( )  RESP:  Physical Exam  Ortho Exam Imaging: No results found.  Past Medical/Family/Surgical/Social History: Medications & Allergies reviewed per EMR, new medications updated. Patient Active Problem List   Diagnosis Date Noted  . Chronic pain of both knees 10/03/2017  . Chronic pain syndrome  10/03/2017  . Status post left knee replacement 10/03/2017  . Spondylosis without myelopathy or radiculopathy, lumbar region 05/17/2017  . Chronic bilateral low back pain without sciatica 05/17/2017  . Chest tightness 02/21/2017  . Cough 02/21/2017  . Osteoarthritis 02/21/2017  . Primary osteoarthritis of left knee 11/09/2015  . S/P total knee replacement using cement 11/09/2015   Past Medical History:  Diagnosis Date  . Arthritis   . Basal cell carcinoma   . BPH (benign prostatic hypertrophy) with urinary obstruction   . Chest tightness 02/21/2017  . Chronic bilateral low back pain without sciatica 05/17/2017  . Chronic pain of both knees 10/03/2017  . Chronic pain syndrome 10/03/2017  . Cough 02/21/2017  . Diverticulosis   . Headache   . Hyperlipidemia   . Hypertension   . Hypogonadism male   . Internal hemorrhoids   . OA (osteoarthritis)   . Osteoarthritis 02/21/2017  . Paroxysmal atrial fibrillation (HCC)   . Primary osteoarthritis of left knee 11/09/2015  . S/P total knee replacement using cement 11/09/2015  . Spondylosis without myelopathy or radiculopathy, lumbar region 05/17/2017  . Status post left knee replacement 10/03/2017   Overview:  Added automatically from request for surgery [id number]   Family History  Problem Relation Age of Onset  . Heart attack Father   . CVA Brother    Past Surgical History:  Procedure Laterality Date  . ATRIAL FIBRILLATION ABLATION N/A 11/16/2017   Procedure: ATRIAL FIBRILLATION ABLATION;  Surgeon: Thompson Grayer, MD;  Location: [place] [facility];  Service: Cardiovascular;  Laterality: N/A;  . CARPAL TUNNEL RELEASE     left  . HIP ARTHROPLASTY    . JOINT REPLACEMENT    . KNEE ARTHROPLASTY     right  . KNEE SURGERY    . SHOULDER SURGERY     bilateral  . TOTAL KNEE ARTHROPLASTY     left  . TOTAL KNEE ARTHROPLASTY Left 11/09/2015   Procedure: TOTAL KNEE ARTHROPLASTY;  Surgeon: Garald Balding, MD;  Location: [place];  Service:  Orthopedics;  Laterality: Left;   Social History   Occupational History  . Occupation: Sales  Tobacco Use  . Smoking status: Never Smoker  . Smokeless tobacco: Never Used  Substance and Sexual Activity  . Alcohol use: Yes    Comment: 1 mixed drink per day  . Drug use: No  . Sexual activity: Not on file

## 2018-07-24 DIAGNOSIS — Z85828 Personal history of other malignant neoplasm of skin: Secondary | ICD-10-CM | POA: Diagnosis not present

## 2018-07-24 DIAGNOSIS — L821 Other seborrheic keratosis: Secondary | ICD-10-CM | POA: Diagnosis not present

## 2018-07-24 DIAGNOSIS — L570 Actinic keratosis: Secondary | ICD-10-CM | POA: Diagnosis not present

## 2018-07-25 ENCOUNTER — Ambulatory Visit (HOSPITAL_BASED_OUTPATIENT_CLINIC_OR_DEPARTMENT_OTHER): Payer: Medicare HMO | Attending: Cardiology | Admitting: Cardiology

## 2018-07-25 DIAGNOSIS — I491 Atrial premature depolarization: Secondary | ICD-10-CM | POA: Insufficient documentation

## 2018-07-25 DIAGNOSIS — R0683 Snoring: Secondary | ICD-10-CM | POA: Insufficient documentation

## 2018-07-25 DIAGNOSIS — G4731 Primary central sleep apnea: Secondary | ICD-10-CM | POA: Insufficient documentation

## 2018-07-25 DIAGNOSIS — I471 Supraventricular tachycardia: Secondary | ICD-10-CM | POA: Diagnosis not present

## 2018-07-25 DIAGNOSIS — G4733 Obstructive sleep apnea (adult) (pediatric): Secondary | ICD-10-CM

## 2018-07-30 NOTE — Procedures (Signed)
   Patient Name Alan Knight, [place] Date: 07/25/2018 Gender: Male D.O.B: 1946/07/03 Age (years): 67 Referring Provider: Fransico Him MD, ABSM Height (inches): 71 Interpreting Physician: Fransico Him MD, ABSM Weight (lbs): 167 RPSGT: Lanae Boast BMI: 23 MRN: [id number] Neck Size: 16.50  CLINICAL INFORMATION  The patient is referred for a BiPAP titration to treat sleep apnea.  SLEEP STUDY TECHNIQUE  As per the AASM Manual for the Scoring of Sleep and Associated Events v2.3 (April 2016) with a hypopnea requiring 4% desaturations. The channels recorded and monitored were frontal, central and occipital EEG, electrooculogram (EOG), submentalis EMG (chin), nasal and oral airflow, thoracic and abdominal wall motion, anterior tibialis EMG, snore microphone, electrocardiogram, and pulse oximetry. Bilevel positive airway pressure (BPAP) was initiated at the beginning of the study and titrated to treat sleep-disordered breathing.  MEDICATIONS  Medications self-administered by patient taken the night of the study : N/A  RESPIRATORY PARAMETERS  Optimal IPAP Pressure (cm):17 AHI at Optimal Pressure (/hr)0.0 Optimal EPAP Pressure (cm):13 Overall Minimal O2 (%):86.0 Minimal O2 at Optimal Pressure (%):95.0  SLEEP ARCHITECTURE  Start Time:9:54:02 PM Stop Time:5:07:56 AM Total Time (min):433.9 Total Sleep Time (min):367.5 Sleep Latency (min):36.7 Sleep Efficiency (%):84.7% REM Latency (min):86.0 WASO (min):29.7 Stage N1 (%):10.7% Stage N2 (%):64.9% Stage N3 (%):0.0% Stage R (%):24.4 Supine (%):100.00 Arousal Index (/hr)19.1  CARDIAC DATA  AThe 2 lead EKG demonstrated sinus rhythm. The mean heart rate was 49.3 beats per minute. Other EKG findings include: PACs, atrial couplets and nonsustained atrial tachycardia.  LEG MOVEMENT DATA  The total Periodic Limb Movements of Sleep (PLMS) were 0. The PLMS index was 0.0. A PLMS index of <15 is considered normal in  adults.  IMPRESSIONS  - An optimal PAP pressure was selected for this patient ( 17/13 cm of water) - Mild Central Sleep Apnea was noted during this titration (CAI = 10.8/h). - Moderete oxygen desaturations were observed during this titration (min O2 = 86.0%). - The patient snored with moderate snoring volume. - 2-lead EKG demonstrated: PACs, atrial couplets and nonsustained atrial tachycardia - Clinically significant periodic limb movements were not noted during this study. Arousals associated with PLMs were rare.  DIAGNOSIS  - Obstructive Sleep Apnea (327.23 [G47.33 ICD-10]) - Nonsustained atrial tachycardia  RECOMMENDATIONS  - Trial of BiPAP therapy on 17/13 cm H2O with a Large size Fisher&Paykel Full Face Mask Simplus mask and heated humidification. - Avoid alcohol, sedatives and other CNS depressants that may worsen sleep apnea and disrupt normal sleep architecture. - Sleep hygiene should be reviewed to assess factors that may improve sleep quality. - Weight management and regular exercise should be initiated or continued. - Return to Sleep Center for re-evaluation after 10 weeks of therapy  [Electronically signed] 07/30/2018 09:03 PM  Fransico Him MD, ABSM Diplomate, American Board of Sleep Medicine

## 2018-08-01 DIAGNOSIS — G43809 Other migraine, not intractable, without status migrainosus: Secondary | ICD-10-CM | POA: Diagnosis not present

## 2018-08-05 ENCOUNTER — Telehealth: Payer: Self-pay | Admitting: *Deleted

## 2018-08-05 NOTE — Telephone Encounter (Signed)
-----   Message from Sueanne Margarita, MD sent at 07/30/2018  9:10 PM EDT ----- Please let patient know that they had a successful PAP titration and let DME know that orders are in EPIC.  Please set up 10 week OV with me.

## 2018-08-05 NOTE — Telephone Encounter (Signed)
Informed patient of sleep study results and patient understanding was verbalized. Patient understands his study was successful and Dr Radford Pax has ordered him a BIpap. Upon patient request DME selection is [facility] Patient understands he will be contacted by [facility] to set up his BIpap. Patient understands to call if [facility] does not contact him with new setup in a timely manner. Patient understands they will be called once confirmation has been received from [facility] that they have received their new machine to schedule 10 week follow up appointment.  [facility] notified of new cpap order  Please add to airview Patient was grateful for the call and thanked me.

## 2018-08-06 ENCOUNTER — Ambulatory Visit (INDEPENDENT_AMBULATORY_CARE_PROVIDER_SITE_OTHER): Payer: Medicare HMO | Admitting: Physical Medicine and Rehabilitation

## 2018-08-06 ENCOUNTER — Encounter (INDEPENDENT_AMBULATORY_CARE_PROVIDER_SITE_OTHER): Payer: Self-pay | Admitting: Physical Medicine and Rehabilitation

## 2018-08-06 ENCOUNTER — Telehealth (INDEPENDENT_AMBULATORY_CARE_PROVIDER_SITE_OTHER): Payer: Self-pay | Admitting: Physical Medicine and Rehabilitation

## 2018-08-06 VITALS — BP 112/69 | HR 69 | Temp 98.0°F

## 2018-08-06 DIAGNOSIS — M545 Low back pain, unspecified: Secondary | ICD-10-CM

## 2018-08-06 DIAGNOSIS — G894 Chronic pain syndrome: Secondary | ICD-10-CM

## 2018-08-06 DIAGNOSIS — M48061 Spinal stenosis, lumbar region without neurogenic claudication: Secondary | ICD-10-CM | POA: Diagnosis not present

## 2018-08-06 DIAGNOSIS — G8929 Other chronic pain: Secondary | ICD-10-CM | POA: Diagnosis not present

## 2018-08-06 NOTE — Progress Notes (Signed)
 .  Numeric Pain Rating Scale and Functional Assessment Average Pain 4 Pain Right Now 2 My pain is intermittent, dull and aching Pain is worse with: some activites Pain improves with: medication   In the last MONTH (on 0-10 scale) has pain interfered with the following?  1. General activity like being  able to carry out your everyday physical activities such as walking, climbing stairs, carrying groceries, or moving a chair?  Rating(3)  2. Relation with others like being able to carry out your usual social activities and roles such as  activities at home, at work and in your community. Rating(3)  3. Enjoyment of life such that you have  been bothered by emotional problems such as feeling anxious, depressed or irritable?  Rating(0)

## 2018-08-07 ENCOUNTER — Encounter (INDEPENDENT_AMBULATORY_CARE_PROVIDER_SITE_OTHER): Payer: Self-pay | Admitting: Physical Medicine and Rehabilitation

## 2018-08-07 NOTE — Progress Notes (Signed)
Alan Knight. - 72 y.o. male MRN [id number]  Date of birth: 1946/11/07  Office Visit Note: Visit Date: 08/06/2018 PCP: Reynold Bowen, MD Referred by: Reynold Bowen, MD  Subjective: Chief Complaint  Patient presents with  . Lower Back - Pain   HPI: Mr. Alan Knight is a 72 year old gentleman who comes in today accompanied by his wife who provides some of the history.  He is a patient well-known to me over the last many years.  There was a.  Where we did not see him in the most recently we have been reintroduced to him for severe back pain with some referral in the buttocks but nothing really down the legs.  He is really a very active individual and former [employer].  He likes to play golf and lift weights and go to the gym.  For the last many months now he has been unable to really enjoy going to the gym because of back pain.  He has played golf on limited occasions.  We went through a situation where we thought this may be more facet mediated pain Duda degenerative facet arthropathy with some scoliosis and lateral recess narrowing on last MRI.  Diagnostic blocks were beneficial but the radiofrequency ablation was not very beneficial.  We have now completed 2 epidural injections for what is basically lateral recess narrowing and some central canal stenosis but without radiculopathy.  His pain level has decreased quite a bit now on his average pain is a 4 out of 10 and he was able to play golf recently without much difficulty overall.  He used to take a fairly consistent dose of anti-inflammatory medicine and this is been stopped do to cardiac issues.  He is currently on Eliquis.  The fact that he cannot take anti-inflammatories I think this really caused him to have a significant flareup of his osteoarthritis pain.  He currently is taking 75 mg of Lyrica twice daily which is managed by his primary care physician.  He takes approximately 250 mg tramadol tablets per day 1 in the morning and 1 at night or  sometimes he will take this around golf.  He is also taking Tylenol on a scheduled basis and the combination of these medications as well as the injection is really dropped his pain level fairly well but not where he wants to be.  He still reports significant back pain with activity level still requiring tramadol for any relief at all.  His cardiac doctors have allowed him to take Aleve twice a day for 2 days out of the week.  He does try to time this around activities.  His pain is intermittent dull and aching but is really problematic for him in terms of what he would like to do.  He would really like to get back to the gym.  We spoke at length today for more than 20 minutes about going to the gym and had a start out slowing which exercises to avoid.  He is going to try to get back at that.   Review of Systems  Constitutional: Negative for chills, fever, malaise/fatigue and weight loss.  HENT: Negative for hearing loss and sinus pain.   Eyes: Negative for blurred vision, double vision and photophobia.  Respiratory: Negative for cough and shortness of breath.   Cardiovascular: Negative for chest pain, palpitations and leg swelling.  Gastrointestinal: Negative for abdominal pain, nausea and vomiting.  Genitourinary: Negative for flank pain.  Musculoskeletal: Positive for back pain. Negative for  myalgias.  Skin: Negative for itching and rash.  Neurological: Negative for tremors, focal weakness and weakness.  Endo/Heme/Allergies: Negative.   Psychiatric/Behavioral: Negative for depression.  All other systems reviewed and are negative.  Otherwise per HPI.  Assessment & Plan: Visit Diagnoses:  1. Spinal stenosis of lumbar region without neurogenic claudication   2. Chronic bilateral low back pain without sciatica   3. Chronic pain syndrome     Plan: Findings:  Chronic severe recalcitrant mostly low back pain some referral in the buttocks consistent now diagnostically and therapeutically with  L3-4 L4-5 mostly lateral recess stenosis with some central stenosis and severe facet arthropathy with degenerative scoliosis more to the right.  He did not get relief with the radiofrequency ablation as much as we hoped but he has done fairly well with epidural injection on 2 occasions.  First injection gave him about 50% relief and then the symptoms started to return but he did get some lasting benefit from that.  We repeated the injection recently and this gave him again approximately 50% relief of that symptoms have started to return but overall he is getting some benefit totally from this.  He reports overall he is about 50% better than when we first saw him it was allowing him with medications to continue to do some activities like golf.  He is going to get back to going to the gym which I think will help him overall will help his mental state which I think is a big issue at this point as well and he can see our prior notes on this.  Given the fact that he is gotten relief with successive epidural injections I want a request from [facility] [facility] to do one more epidural injection to see how much relief we get and then at that point we will just watch him for a while with continued medication management.  I would like to be able to back off on the tramadol if possible but for right now we will go ahead and keep refilling this as he is using this very appropriately he is usually only using 2 tablets/day.  He will continue with current medications which are Lyrica twice a day as well as 2 days out of week he uses Naprosyn.  He also uses Tylenol on a scheduled basis.  I do want him to start up his activity level slowly and we talked about that at length today.    Meds & Orders: No orders of the defined types were placed in this encounter.  No orders of the defined types were placed in this encounter.   Follow-up: Return for repeat epidural.   Procedures: No procedures performed  No notes on file   Clinical  History: Lumbar spine MRI 05/31/2016  Disc levels:  Disc spaces: Degenerative disc disease with disc height loss throughout the lumbar spine.  T11-12: Minimal broad-based disc bulge. Bilateral facet arthropathy. No spinal or foraminal stenosis.  T12-L1: Broad-based disc bulge eccentric towards the left. Moderate left and mild right facet arthropathy. Moderate left foraminal stenosis. No central canal stenosis.  L1-L2: Broad-based disc bulge with a left lateral disc osteophyte complex. Mild bilateral facet arthropathy. Left lateral recess narrowing. Mild bilateral foraminal narrowing. No central canal stenosis.  L2-L3: Mild broad-based disc bulge. Mild bilateral facet arthropathy. No evidence of neural foraminal stenosis. No central canal stenosis.  L3-L4: Broad-based disc bulge with a a right lateral disc osteophyte complex. Moderate bilateral facet arthropathy. Right lateral recess stenosis. Moderate -severe foraminal stenosis.  Mild spinal stenosis.  L4-L5: Broad-based disc bulge. Moderate bilateral facet arthropathy, right worse than left with ligamentum flavum infolding. Moderate spinal stenosis. Severe right and mild-moderate left foraminal stenosis.  L5-S1: Mild broad-based disc bulge. Moderate bilateral facet arthropathy. Moderate right and mild left foraminal stenosis. No central canal stenosis.  IMPRESSION: 1. Diffuse degenerative disc disease and facet arthropathy as described above which is similar in appearance to the prior examination of 01/22/2014. 2. At L4-5 there is a broad-based disc bulge. Moderate bilateral facet arthropathy, right worse than left with ligamentum flavum infolding. Moderate spinal stenosis. Severe right and mild-moderate left foraminal stenosis.   He reports that he has never smoked. He has never used smokeless tobacco. No results for input(s): HGBA1C, LABURIC in the last 8760 hours.  Objective:  VS:  HT:    WT:   BMI:      BP:112/69  HR:69bpm  TEMP:98 F (36.7 C)(Oral)  RESP:98 % Physical Exam  Constitutional: He is oriented to person, place, and time. He appears well-developed and well-nourished. No distress.  HENT:  Head: Normocephalic and atraumatic.  Nose: Nose normal.  Mouth/Throat: Oropharynx is clear and moist.  Eyes: Pupils are equal, round, and reactive to light. Conjunctivae are normal.  Neck: Normal range of motion. Neck supple. No tracheal deviation present.  Cardiovascular: Regular rhythm and intact distal pulses.  Pulmonary/Chest: Effort normal and breath sounds normal. No respiratory distress.  Abdominal: Soft. He exhibits no distension.  Musculoskeletal: He exhibits no deformity.  Patient stands with a forward flexed lumbar spine and does have a great deal of stiffness trying to get into extension.  He does have some reproduction of pain with extension.  He has a negative slump test bilaterally with good distal strength.  He has well-healed surgical scars of both knees.  He has no swelling or edema in the lower extremities.  He has no clonus.  Neurological: He is alert and oriented to person, place, and time. He exhibits normal muscle tone. Coordination normal.  Skin: Skin is warm and dry. No rash noted. No erythema.  Psychiatric: He has a normal mood and affect. His behavior is normal.  Nursing note and vitals reviewed.   Ortho Exam Imaging: No results found.  Past Medical/Family/Surgical/Social History: Medications & Allergies reviewed per EMR, new medications updated. Patient Active Problem List   Diagnosis Date Noted  . Chronic pain of both knees 10/03/2017  . Chronic pain syndrome 10/03/2017  . Status post left knee replacement 10/03/2017  . Spondylosis without myelopathy or radiculopathy, lumbar region 05/17/2017  . Chronic bilateral low back pain without sciatica 05/17/2017  . Chest tightness 02/21/2017  . Cough 02/21/2017  . Osteoarthritis 02/21/2017  . Primary  osteoarthritis of left knee 11/09/2015  . S/P total knee replacement using cement 11/09/2015   Past Medical History:  Diagnosis Date  . Arthritis   . Basal cell carcinoma   . BPH (benign prostatic hypertrophy) with urinary obstruction   . Chest tightness 02/21/2017  . Chronic bilateral low back pain without sciatica 05/17/2017  . Chronic pain of both knees 10/03/2017  . Chronic pain syndrome 10/03/2017  . Cough 02/21/2017  . Diverticulosis   . Headache   . Hyperlipidemia   . Hypertension   . Hypogonadism male   . Internal hemorrhoids   . OA (osteoarthritis)   . Osteoarthritis 02/21/2017  . Paroxysmal atrial fibrillation (HCC)   . Primary osteoarthritis of left knee 11/09/2015  . S/P total knee replacement using cement 11/09/2015  . Spondylosis  without myelopathy or radiculopathy, lumbar region 05/17/2017  . Status post left knee replacement 10/03/2017   Overview:  Added automatically from request for surgery [id number]   Family History  Problem Relation Age of Onset  . Heart attack Father   . CVA Brother    Past Surgical History:  Procedure Laterality Date  . ATRIAL FIBRILLATION ABLATION N/A 11/16/2017   Procedure: ATRIAL FIBRILLATION ABLATION;  Surgeon: Thompson Grayer, MD;  Location: [facility];  Service: Cardiovascular;  Laterality: N/A;  . CARPAL TUNNEL RELEASE     left  . HIP ARTHROPLASTY    . JOINT REPLACEMENT    . KNEE ARTHROPLASTY     right  . KNEE SURGERY    . SHOULDER SURGERY     bilateral  . TOTAL KNEE ARTHROPLASTY     left  . TOTAL KNEE ARTHROPLASTY Left 11/09/2015   Procedure: TOTAL KNEE ARTHROPLASTY;  Surgeon: Garald Balding, MD;  Location: [facility];  Service: Orthopedics;  Laterality: Left;   Social History   Occupational History  . Occupation: Sales  Tobacco Use  . Smoking status: Never Smoker  . Smokeless tobacco: Never Used  Substance and Sexual Activity  . Alcohol use: Yes    Comment: 1 mixed drink per day  . Drug use: No  . Sexual  activity: Not on file

## 2018-08-13 ENCOUNTER — Telehealth (INDEPENDENT_AMBULATORY_CARE_PROVIDER_SITE_OTHER): Payer: Self-pay | Admitting: *Deleted

## 2018-08-13 NOTE — Telephone Encounter (Signed)
ervice QASUO:[id number] Initiated Date:08/13/2019Case Activity:Submission in ProgressCase Status:Pending

## 2018-08-14 ENCOUNTER — Telehealth (INDEPENDENT_AMBULATORY_CARE_PROVIDER_SITE_OTHER): Payer: Self-pay | Admitting: *Deleted

## 2018-08-14 NOTE — Telephone Encounter (Signed)
OK to hold eliquis for 2 days if medically necessary for the procedure.

## 2018-08-15 NOTE — Telephone Encounter (Signed)
[id number]  Effective 08/13/18-11/11/18  Pt is scheduled 08/30/18 with driver and will hold BT 2 days prior to appt date.

## 2018-08-19 DIAGNOSIS — G4733 Obstructive sleep apnea (adult) (pediatric): Secondary | ICD-10-CM | POA: Diagnosis not present

## 2018-08-20 IMAGING — NM NM MISC PROCEDURE
3 series · 18 of 18 positions shown · non-contrast
Comparison: none

[Series 1: stress-sum-em_(id)_sa · 6.4mm · 6.40mm/px · 6 of 64 frames shown]
[frame 6/64]
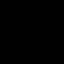
[frame 16/64]
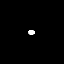
[frame 27/64]
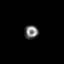
[frame 38/64]
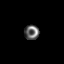
[frame 48/64]
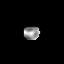
[frame 59/64]
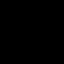

[Series 1: stress-gsp_(id)_sa · 6.4mm · 6.40mm/px · 6 of 512 frames shown]
[frame 43/512]
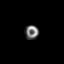
[frame 128/512]
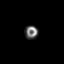
[frame 214/512]
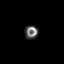
[frame 299/512]
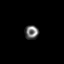
[frame 384/512]
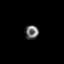
[frame 470/512]
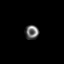

[Series 1: rest_(id)_sa · 6.4mm · 6.40mm/px · 6 of 64 frames shown]
[frame 6/64]
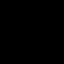
[frame 16/64]
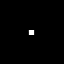
[frame 27/64]
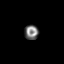
[frame 38/64]
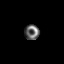
[frame 48/64]
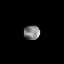
[frame 59/64]
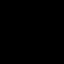

[18 of 18 positions shown; findings below may reference images not displayed]

Canned report from images found in remote index.

Refer to host system for actual result text.

## 2018-08-20 NOTE — Telephone Encounter (Signed)
Patient scheduled.

## 2018-08-30 ENCOUNTER — Ambulatory Visit (INDEPENDENT_AMBULATORY_CARE_PROVIDER_SITE_OTHER): Payer: Self-pay

## 2018-08-30 ENCOUNTER — Encounter (INDEPENDENT_AMBULATORY_CARE_PROVIDER_SITE_OTHER): Payer: Self-pay | Admitting: Physical Medicine and Rehabilitation

## 2018-08-30 ENCOUNTER — Ambulatory Visit (INDEPENDENT_AMBULATORY_CARE_PROVIDER_SITE_OTHER): Payer: Medicare HMO | Admitting: Physical Medicine and Rehabilitation

## 2018-08-30 VITALS — BP 121/74 | HR 58 | Temp 98.1°F

## 2018-08-30 DIAGNOSIS — M48061 Spinal stenosis, lumbar region without neurogenic claudication: Secondary | ICD-10-CM

## 2018-08-30 MED ORDER — METHYLPREDNISOLONE ACETATE 80 MG/ML IJ SUSP
80.0000 mg | Freq: Once | INTRAMUSCULAR | Status: AC
  Administered 2018-08-30: 80 mg

## 2018-08-30 NOTE — Procedures (Signed)
Lumbar Epidural Steroid Injection - Interlaminar Approach with Fluoroscopic Guidance  Patient: Alan Knight.      Date of Birth: 1946-05-18 MRN: [id number] PCP: Reynold Bowen, MD      Visit Date: 08/30/2018   Universal Protocol:     Consent Given By: the patient  Position: PRONE  Additional Comments: Vital signs were monitored before and after the procedure. Patient was prepped and draped in the usual sterile fashion. The correct patient, procedure, and site was verified.   Injection Procedure Details:  Procedure Site One Meds Administered:  Meds ordered this encounter  Medications  . methylPREDNISolone acetate (DEPO-MEDROL) injection 80 mg     Laterality: Left  Location/Site:  L4-L5  Needle size: 20 G  Needle type: Tuohy  Needle Placement: Paramedian epidural  Findings:   -Comments: Excellent flow of contrast into the epidural space.  Procedure Details: Using a paramedian approach from the side mentioned above, the region overlying the inferior lamina was localized under fluoroscopic visualization and the soft tissues overlying this structure were infiltrated with 4 ml. of 1% Lidocaine without Epinephrine. The Tuohy needle was inserted into the epidural space using a paramedian approach.   The epidural space was localized using loss of resistance along with lateral and bi-planar fluoroscopic views.  After negative aspirate for air, blood, and CSF, a 2 ml. volume of Isovue-250 was injected into the epidural space and the flow of contrast was observed. Radiographs were obtained for documentation purposes.    The injectate was administered into the level noted above.   Additional Comments:  The patient tolerated the procedure well Dressing: Band-Aid    Post-procedure details: Patient was observed during the procedure. Post-procedure instructions were reviewed.  Patient left the clinic in stable condition.

## 2018-08-30 NOTE — Progress Notes (Signed)
.  Numeric Pain Rating Scale and Functional Assessment Average Pain 7   In the last MONTH (on 0-10 scale) has pain interfered with the following?  1. General activity like being  able to carry out your everyday physical activities such as walking, climbing stairs, carrying groceries, or moving a chair?  Rating(5)   +Driver, +BT(pt stopped eliquis 08/27/18) , -Dye Allergies.

## 2018-08-30 NOTE — Patient Instructions (Signed)

## 2018-09-03 NOTE — Telephone Encounter (Signed)
Patient has a 10 week follow up appointment scheduled for 11/20. 2019. Patient/wife per dpr understands he needs to keep this appointment for insurance compliance. Patient/wife per dpr was grateful for the call and thanked me.

## 2018-09-17 NOTE — Progress Notes (Signed)
Alan Knight. - 72 y.o. male MRN [id number]  Date of birth: Feb 28, 1946  Office Visit Note: Visit Date: 08/30/2018 PCP: Reynold Bowen, MD Referred by: Reynold Bowen, MD  Subjective: Chief Complaint  Patient presents with  . Lower Back - Pain   HPI: Alan Knight is a 72 year old gentleman that comes in today for last in a series of epidural injections at L4-5 for stenosis and spondylosis and low back pain and some radicular pain.  He is improved significantly from when we first saw him but he still reporting a lot of pain.  He is somewhat apprehensive about going back to the gym and maintaining activity level.  He has been playing golf.  I think he is doing fairly well but we will finish off the injection and we will see him back as needed.   ROS Otherwise per HPI.  Assessment & Plan: Visit Diagnoses:  1. Spinal stenosis of lumbar region without neurogenic claudication     Plan: No additional findings.   Meds & Orders:  Meds ordered this encounter  Medications  . methylPREDNISolone acetate (DEPO-MEDROL) injection 80 mg    Orders Placed This Encounter  Procedures  . XR C-ARM NO REPORT  . Epidural Steroid injection    Follow-up: Return if symptoms worsen or fail to improve, for consider MRI.   Procedures: No procedures performed  Lumbar Epidural Steroid Injection - Interlaminar Approach with Fluoroscopic Guidance  Patient: Alan Knight.      Date of Birth: 1946/08/31 MRN: [id number] PCP: Reynold Bowen, MD      Visit Date: 08/30/2018   Universal Protocol:     Consent Given By: the patient  Position: PRONE  Additional Comments: Vital signs were monitored before and after the procedure. Patient was prepped and draped in the usual sterile fashion. The correct patient, procedure, and site was verified.   Injection Procedure Details:  Procedure Site One Meds Administered:  Meds ordered this encounter  Medications  . methylPREDNISolone acetate  (DEPO-MEDROL) injection 80 mg     Laterality: Left  Location/Site:  L4-L5  Needle size: 20 G  Needle type: Tuohy  Needle Placement: Paramedian epidural  Findings:   -Comments: Excellent flow of contrast into the epidural space.  Procedure Details: Using a paramedian approach from the side mentioned above, the region overlying the inferior lamina was localized under fluoroscopic visualization and the soft tissues overlying this structure were infiltrated with 4 ml. of 1% Lidocaine without Epinephrine. The Tuohy needle was inserted into the epidural space using a paramedian approach.   The epidural space was localized using loss of resistance along with lateral and bi-planar fluoroscopic views.  After negative aspirate for air, blood, and CSF, a 2 ml. volume of Isovue-250 was injected into the epidural space and the flow of contrast was observed. Radiographs were obtained for documentation purposes.    The injectate was administered into the level noted above.   Additional Comments:  The patient tolerated the procedure well Dressing: Band-Aid    Post-procedure details: Patient was observed during the procedure. Post-procedure instructions were reviewed.  Patient left the clinic in stable condition.   Clinical History: Lumbar spine MRI 05/31/2016  Disc levels:  Disc spaces: Degenerative disc disease with disc height loss throughout the lumbar spine.  T11-12: Minimal broad-based disc bulge. Bilateral facet arthropathy. No spinal or foraminal stenosis.  T12-L1: Broad-based disc bulge eccentric towards the left. Moderate left and mild right facet arthropathy. Moderate left foraminal stenosis. No central canal  stenosis.  L1-L2: Broad-based disc bulge with a left lateral disc osteophyte complex. Mild bilateral facet arthropathy. Left lateral recess narrowing. Mild bilateral foraminal narrowing. No central canal stenosis.  L2-L3: Mild broad-based disc bulge. Mild  bilateral facet arthropathy. No evidence of neural foraminal stenosis. No central canal stenosis.  L3-L4: Broad-based disc bulge with a a right lateral disc osteophyte complex. Moderate bilateral facet arthropathy. Right lateral recess stenosis. Moderate -severe foraminal stenosis. Mild spinal stenosis.  L4-L5: Broad-based disc bulge. Moderate bilateral facet arthropathy, right worse than left with ligamentum flavum infolding. Moderate spinal stenosis. Severe right and mild-moderate left foraminal stenosis.  L5-S1: Mild broad-based disc bulge. Moderate bilateral facet arthropathy. Moderate right and mild left foraminal stenosis. No central canal stenosis.  IMPRESSION: 1. Diffuse degenerative disc disease and facet arthropathy as described above which is similar in appearance to the prior examination of 01/22/2014. 2. At L4-5 there is a broad-based disc bulge. Moderate bilateral facet arthropathy, right worse than left with ligamentum flavum infolding. Moderate spinal stenosis. Severe right and mild-moderate left foraminal stenosis.   He reports that he has never smoked. He has never used smokeless tobacco. No results for input(s): HGBA1C, LABURIC in the last 8760 hours.  Objective:  VS:  HT:    WT:   BMI:     BP:121/74  HR:(!) 58bpm  TEMP:98.1 F (36.7 C)(Oral)  RESP:  Physical Exam  Ortho Exam Imaging: No results found.  Past Medical/Family/Surgical/Social History: Medications & Allergies reviewed per EMR, new medications updated. Patient Active Problem List   Diagnosis Date Noted  . Chronic pain of both knees 10/03/2017  . Chronic pain syndrome 10/03/2017  . Status post left knee replacement 10/03/2017  . Spondylosis without myelopathy or radiculopathy, lumbar region 05/17/2017  . Chronic bilateral low back pain without sciatica 05/17/2017  . Chest tightness 02/21/2017  . Cough 02/21/2017  . Osteoarthritis 02/21/2017  . Primary osteoarthritis of left knee  11/09/2015  . S/P total knee replacement using cement 11/09/2015   Past Medical History:  Diagnosis Date  . Arthritis   . Basal cell carcinoma   . BPH (benign prostatic hypertrophy) with urinary obstruction   . Chest tightness 02/21/2017  . Chronic bilateral low back pain without sciatica 05/17/2017  . Chronic pain of both knees 10/03/2017  . Chronic pain syndrome 10/03/2017  . Cough 02/21/2017  . Diverticulosis   . Headache   . Hyperlipidemia   . Hypertension   . Hypogonadism male   . Internal hemorrhoids   . OA (osteoarthritis)   . Osteoarthritis 02/21/2017  . Paroxysmal atrial fibrillation (HCC)   . Primary osteoarthritis of left knee 11/09/2015  . S/P total knee replacement using cement 11/09/2015  . Spondylosis without myelopathy or radiculopathy, lumbar region 05/17/2017  . Status post left knee replacement 10/03/2017   Overview:  Added automatically from request for surgery [id number]   Family History  Problem Relation Age of Onset  . Heart attack Father   . CVA Brother    Past Surgical History:  Procedure Laterality Date  . ATRIAL FIBRILLATION ABLATION N/A 11/16/2017   Procedure: ATRIAL FIBRILLATION ABLATION;  Surgeon: Thompson Grayer, MD;  Location: [facility];  Service: Cardiovascular;  Laterality: N/A;  . CARPAL TUNNEL RELEASE     left  . HIP ARTHROPLASTY    . JOINT REPLACEMENT    . KNEE ARTHROPLASTY     right  . KNEE SURGERY    . SHOULDER SURGERY     bilateral  . TOTAL KNEE ARTHROPLASTY  left  . TOTAL KNEE ARTHROPLASTY Left 11/09/2015   Procedure: TOTAL KNEE ARTHROPLASTY;  Surgeon: Garald Balding, MD;  Location: [place];  Service: Orthopedics;  Laterality: Left;   Social History   Occupational History  . Occupation: Sales  Tobacco Use  . Smoking status: Never Smoker  . Smokeless tobacco: Never Used  Substance and Sexual Activity  . Alcohol use: Yes    Comment: 1 mixed drink per day  . Drug use: No  . Sexual activity: Not on file

## 2018-09-19 DIAGNOSIS — G4733 Obstructive sleep apnea (adult) (pediatric): Secondary | ICD-10-CM | POA: Diagnosis not present

## 2018-09-25 DIAGNOSIS — R69 Illness, unspecified: Secondary | ICD-10-CM | POA: Diagnosis not present

## 2018-09-27 ENCOUNTER — Telehealth: Payer: Self-pay | Admitting: *Deleted

## 2018-09-27 DIAGNOSIS — G4733 Obstructive sleep apnea (adult) (pediatric): Secondary | ICD-10-CM

## 2018-09-27 NOTE — Telephone Encounter (Signed)
Informed patient of compliance results and verbalized understanding was indicated. Patient is aware and agreeable to AHI being out of range at 10.1. Patient confirms that he does sleep on his back. Patient was not aware he had a large mask leak but he will take his mask to his DME to have it refitted.

## 2018-09-27 NOTE — Telephone Encounter (Signed)
-----   Message from Sueanne Margarita, MD sent at 09/19/2018  4:41 PM EDT ----- AHI is too high.  Please find out if patient is sleeping on his back.  He also has a large mask leak so please have him take his mass to the DME to have it refitted.

## 2018-09-28 DIAGNOSIS — Z23 Encounter for immunization: Secondary | ICD-10-CM | POA: Diagnosis not present

## 2018-10-03 ENCOUNTER — Ambulatory Visit: Payer: Medicare HMO | Admitting: Internal Medicine

## 2018-10-16 ENCOUNTER — Telehealth (INDEPENDENT_AMBULATORY_CARE_PROVIDER_SITE_OTHER): Payer: Self-pay | Admitting: Physical Medicine and Rehabilitation

## 2018-10-16 DIAGNOSIS — M47816 Spondylosis without myelopathy or radiculopathy, lumbar region: Secondary | ICD-10-CM

## 2018-10-16 DIAGNOSIS — M48061 Spinal stenosis, lumbar region without neurogenic claudication: Secondary | ICD-10-CM

## 2018-10-16 DIAGNOSIS — M419 Scoliosis, unspecified: Secondary | ICD-10-CM

## 2018-10-16 NOTE — Telephone Encounter (Signed)
Can do MRI, last was 2017, do not have much to offer except neurosugical evaluation or regrouping with PT, alread doing chiropractic care.

## 2018-10-19 DIAGNOSIS — G4733 Obstructive sleep apnea (adult) (pediatric): Secondary | ICD-10-CM | POA: Diagnosis not present

## 2018-10-27 ENCOUNTER — Ambulatory Visit
Admission: RE | Admit: 2018-10-27 | Discharge: 2018-10-27 | Disposition: A | Discharge status: Home / Self Care | Payer: Medicare HMO | Source: Ambulatory Visit | Attending: Physical Medicine and Rehabilitation | Admitting: Physical Medicine and Rehabilitation

## 2018-10-27 DIAGNOSIS — M48061 Spinal stenosis, lumbar region without neurogenic claudication: Secondary | ICD-10-CM | POA: Diagnosis not present

## 2018-10-27 DIAGNOSIS — M47816 Spondylosis without myelopathy or radiculopathy, lumbar region: Secondary | ICD-10-CM

## 2018-10-27 DIAGNOSIS — M419 Scoliosis, unspecified: Secondary | ICD-10-CM

## 2018-10-28 DIAGNOSIS — L570 Actinic keratosis: Secondary | ICD-10-CM | POA: Diagnosis not present

## 2018-10-28 DIAGNOSIS — Z85828 Personal history of other malignant neoplasm of skin: Secondary | ICD-10-CM | POA: Diagnosis not present

## 2018-10-28 DIAGNOSIS — L821 Other seborrheic keratosis: Secondary | ICD-10-CM | POA: Diagnosis not present

## 2018-10-29 ENCOUNTER — Telehealth: Payer: Self-pay | Admitting: *Deleted

## 2018-10-29 ENCOUNTER — Telehealth: Payer: Self-pay | Admitting: Internal Medicine

## 2018-10-29 ENCOUNTER — Encounter (INDEPENDENT_AMBULATORY_CARE_PROVIDER_SITE_OTHER): Payer: Self-pay | Admitting: Physical Medicine and Rehabilitation

## 2018-10-29 ENCOUNTER — Ambulatory Visit (INDEPENDENT_AMBULATORY_CARE_PROVIDER_SITE_OTHER): Payer: Medicare HMO | Admitting: Physical Medicine and Rehabilitation

## 2018-10-29 VITALS — BP 116/69 | HR 58

## 2018-10-29 DIAGNOSIS — G894 Chronic pain syndrome: Secondary | ICD-10-CM

## 2018-10-29 DIAGNOSIS — M419 Scoliosis, unspecified: Secondary | ICD-10-CM

## 2018-10-29 DIAGNOSIS — M47816 Spondylosis without myelopathy or radiculopathy, lumbar region: Secondary | ICD-10-CM | POA: Diagnosis not present

## 2018-10-29 DIAGNOSIS — M48061 Spinal stenosis, lumbar region without neurogenic claudication: Secondary | ICD-10-CM

## 2018-10-29 NOTE — Telephone Encounter (Signed)
-----   Message from Sueanne Margarita, MD sent at 10/28/2018  6:05 PM EST ----- AHI too high - increase BiPAP to 18/14cm H2O and encourage compliance.  Repeat download in 4 weeks

## 2018-10-29 NOTE — Telephone Encounter (Signed)
Gave patient recommended pressure changes made by Dr. Radford Pax. Order faxed to [facility]. Left detailed message on voicemail and informed patient to call back at [phone] with questions.

## 2018-10-29 NOTE — Telephone Encounter (Signed)
There are no contraindications with turmeric and his other medications, however would advise pt to monitor for any extra bruising or bleeding. Turmeric can slow blood clotting and pt already takes Eliquis. Would also counsel him to avoid naproxen on his med list and other NSAIDs due to increased risk of bleeding as well.

## 2018-10-29 NOTE — Addendum Note (Signed)
Addended by: Freada Bergeron on: 10/29/2018 02:34 PM   Modules accepted: Orders

## 2018-10-29 NOTE — Telephone Encounter (Signed)
LMTCB

## 2018-10-29 NOTE — Telephone Encounter (Addendum)
Order faxed to [facility] and sent via community message.and patient notified.

## 2018-10-29 NOTE — Progress Notes (Signed)
.  Numeric Pain Rating Scale and Functional Assessment Average Pain 5 Pain Right Now 3 My pain is constant, dull and aching Pain is worse with: bending and some activites Pain improves with: medication   In the last MONTH (on 0-10 scale) has pain interfered with the following?  1. General activity like being  able to carry out your everyday physical activities such as walking, climbing stairs, carrying groceries, or moving a chair?  Rating(4)  2. Relation with others like being able to carry out your usual social activities and roles such as  activities at home, at work and in your community. Rating(4)  3. Enjoyment of life such that you have  been bothered by emotional problems such as feeling anxious, depressed or irritable?  Rating(3)

## 2018-10-29 NOTE — Telephone Encounter (Signed)
Alan Margarita, MD  Reynold Bowen, MD; Freada Bergeron, CMA        AHI too high - increase BiPAP to 18/14cm H2O and encourage compliance. Repeat download in 4 weeks

## 2018-10-29 NOTE — Telephone Encounter (Signed)
Pt c/o medication issue:  1. Name of Medication: Tumeric   2. How are you currently taking this medication (dosage and times per day)?    3. Are you having a reaction (difficulty breathing--STAT)? none  4. What is your medication issue? Orthopedic doctor is putting her husband on this she wanted to make sure it was ok for him to take along with the heart medication Dr. Rayann Heman has Eddie Dibbles on.

## 2018-10-30 NOTE — Telephone Encounter (Signed)
Pt made aware of our Pharmacist recommendations concerning his tumeric.  Educated the pt per [employer].  Pt verbalized understanding and agrees with this plan.

## 2018-10-30 NOTE — Telephone Encounter (Signed)
Patient returned called

## 2018-10-31 MED ORDER — DULOXETINE HCL 20 MG PO CPEP: ORAL_CAPSULE | ORAL | 0 refills | Status: DC

## 2018-11-01 DIAGNOSIS — G4733 Obstructive sleep apnea (adult) (pediatric): Secondary | ICD-10-CM | POA: Diagnosis not present

## 2018-11-05 ENCOUNTER — Other Ambulatory Visit (INDEPENDENT_AMBULATORY_CARE_PROVIDER_SITE_OTHER): Payer: Self-pay | Admitting: Physical Medicine and Rehabilitation

## 2018-11-05 MED ORDER — DULOXETINE HCL 60 MG PO CPEP: 60.0000 mg | ORAL_CAPSULE | Freq: Every day | ORAL | 3 refills | Status: AC

## 2018-11-11 ENCOUNTER — Encounter (INDEPENDENT_AMBULATORY_CARE_PROVIDER_SITE_OTHER): Payer: Self-pay | Admitting: Physical Medicine and Rehabilitation

## 2018-11-11 NOTE — Progress Notes (Signed)
Alan Knight. - 72 y.o. male MRN [id number]  Date of birth: June 16, 1946  Office Visit Note: Visit Date: 10/29/2018 PCP: Reynold Bowen, MD Referred by: Reynold Bowen, MD  Subjective: Chief Complaint  Patient presents with  . Lower Back - Pain   HPI: Alan Knight. is a 72 y.o. male who comes in today For evaluation management of his chronic worsening severe low back pain is been recalcitrant to injections and medication management recently.  He comes in today with his wife as usual who provides some of the history.  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  He had been well maintained on the use of anti-inflammatories and intermittent injections for years.  He more recently had some issues with his heart and was placed on a anticoagulant and has not been able to take a nonsteroidal anti-inflammatory since that time.  His cardiac doctors to allow him to take Aleve a couple of times a week but nothing sustained.  He has tried other medications and is maintained on a couple of tramadol a day and that seems to help.  He is able to play golf.  He has gotten back to the gym to some degree.  We completed a series of interlaminar epidural injections because the first interlaminar injection done did seem to help him quite a bit.  After the other 2 though he has not noted much in the way of difference please.  He has had radiofrequency ablation of the lumbar facet joints.  Unfortunately this was a very bad experience for him which is very uncommon that we have anybody complaining of a lot of pain from the procedure.  Interestingly during the procedure itself he did not have much in the way of complaints at all but now it is an issue that he says it was very painful.  Nonetheless it did not help.  On her last visit we ordered MRI of the lumbar spine for review to see if there is anything new that it changed over the last year or 2.  We did  review the MRI with him today and is really not much in the way of differences.  In the upper lumbar region there is extraforaminal protrusion but nothing severe.  He continues to have worse findings at L4-5 with facet arthropathy and foraminal narrowing on the right with degenerative curvature which is moderate.  There is moderate multifactorial central stenosis.Marland Kitchen  He is not really interested in lumbar surgery.  Review of Systems  Constitutional: Negative for chills, fever, malaise/fatigue and weight loss.  HENT: Negative for hearing loss and sinus pain.   Eyes: Negative for blurred vision, double vision and photophobia.  Respiratory: Negative for cough and shortness of breath.   Cardiovascular: Negative for chest pain, palpitations and leg swelling.  Gastrointestinal: Negative for abdominal pain, nausea and vomiting.  Genitourinary: Negative for flank pain.  Musculoskeletal: Positive for back pain and joint pain. Negative for myalgias.  Skin: Negative for itching and rash.  Neurological: Negative for tremors, focal weakness and weakness.  Endo/Heme/Allergies: Negative.   Psychiatric/Behavioral: Negative for depression.  All other systems reviewed and are negative.  Otherwise per HPI.  Assessment & Plan: Visit Diagnoses:  1. Spinal stenosis of lumbar region without neurogenic claudication   2. Spondylosis without myelopathy or radiculopathy, lumbar region   3. Scoliosis of lumbar spine, unspecified scoliosis type   4.  Chronic pain syndrome     Plan: Findings:  Chronic recalcitrant low back pain with some relief after series of epidural injections but no relief with facet ablation.  Pain level now at 5 on average and he is able to play golf and go to the gym some.  I have asked him to continue to go to the gym and strengthen his spine as I think that is important.  I think 1 of the things that bother him is he cannot take his anti-inflammatories like he used to.  He does take tramadol  sparingly and we will provide that medication.  He continues to see the chiropractor.  Overall is hard to convince him that he is doing fairly well but I think it is because he has not done as well as he would like to.  His options at this point would be lumbar spine surgery versus potential stimulator trial which would be somewhat difficult on anticoagulant but could be done.  Other avenues medication management.  We discussed the fact that I am not what he believes and chronic escalating doses of narcotics and is not something he really wants anyway.  I did discuss the Loxitane.  We are going to start duloxetine 20 mg at night and try to work her way up to a higher dose.  We talked about the risks and benefits of that medication.  He will call us back in a month or so and we will see how he is doing with that medication.  If he does not tolerate he can call us sooner.    Meds & Orders:  Meds ordered this encounter  Medications  . DISCONTD: DULoxetine (CYMBALTA) 20 MG capsule    Sig: Take 1 capsule (20 mg total) by mouth daily for 7 days, THEN 2 capsules (40 mg total) daily for 7 days, THEN 3 capsules (60 mg total) daily.    Dispense:  159 capsule    Refill:  0   No orders of the defined types were placed in this encounter.   Follow-up: Return if symptoms worsen or fail to improve.   Procedures: No procedures performed  No notes on file   Clinical History: MRI LUMBAR SPINE WITHOUT CONTRAST  TECHNIQUE: Multiplanar, multisequence MR imaging of the lumbar spine was performed. No intravenous contrast was administered.  COMPARISON:  Lumbar MRI 05/31/2016.  FINDINGS: Segmentation: Conventional anatomy assumed, with the last open disc space designated L5-S1.This corresponds with previous numbering.  Alignment: Stable. There is a moderate convex right scoliosis centered at L1-2. There is a slight retrolisthesis at L4-5 and a slight anterolisthesis at L5-S1.  Vertebrae: No worrisome  osseous lesion, acute fracture or pars defect. There are scattered endplate degenerative changes throughout the lumbar spine. There is a stable small hemangioma at L4. The lumbar pedicles are somewhat short on a congenital basis. The visualized sacroiliac joints appear unremarkable.  Conus medullaris: Extends to the T12 level and appears normal.  Paraspinal and other soft tissues: No significant paraspinal findings.  Disc levels:  T10-11: Sagittal images demonstrate effacement of the CSF surrounding the cord due to annular disc bulging and bilateral facet hypertrophy. This level is not imaged in the axial plane. Appearance is similar to the previous examination.  T11-12: Sagittal images demonstrate mild disc bulging without significant spinal stenosis or nerve root encroachment.  T12-L1: Loss of disc height with annular disc bulging and endplate osteophytes, asymmetric to the left, similar to previous study. There is moderate left-sided foraminal narrowing which  appears unchanged.  L1-2: Loss of disc height with annular disc bulging and endplate osteophytes asymmetric to the left. There is stable asymmetric mild left lateral recess and moderate left foraminal narrowing.  L2-3: Loss of disc height with annular disc bulging. There is a new small disc protrusion in the right subarticular zone which narrows the right lateral recess and may contribute to right L3 nerve root encroachment. In addition, there is a broad-based extraforaminal disc protrusion on the left which may contribute to left L2 nerve root encroachment.  L3-4: Chronic loss of disc height with annular disc bulging and endplate osteophytes asymmetric to the right. Moderate facet and ligamentous hypertrophy are also asymmetric to the right. There is resulting mild spinal stenosis and lateral recess narrowing bilaterally which appears unchanged. In addition, there is moderate to severe right foraminal  narrowing which appears chronic.  L4-5: Chronic loss of disc height with annular disc bulging and endplate osteophytes asymmetric to the right. Moderate facet and ligamentous hypertrophy, worse on the right. These factors contribute to stable moderate multifactorial spinal stenosis, moderate to severe right and mild left foraminal narrowing.  L5-S1: Stable mild disc bulging and facet hypertrophy. Stable mild right-greater-than-left foraminal narrowing.  IMPRESSION: 1. Multilevel spondylosis associated with a convex right scoliosis. 2. At L2-3, there are new right subarticular zone and left extraforaminal disc protrusions which may contribute to right L3 and left L2 nerve root encroachment, respectively. 3. The findings at the additional levels are stable. There is mild multifactorial spinal stenosis and moderate to severe right foraminal narrowing at L3-4. At L4-5, there is moderate multifactorial spinal stenosis with moderate to severe right foraminal narrowing. 4. Chronic effacement of the CSF surrounding the cord at T10-11 without cord deformity, similar to previous study.   Electronically Signed   By: Richardean Sale M.D.   On: 10/28/2018 08:37   He reports that he has never smoked. He has never used smokeless tobacco. No results for input(s): HGBA1C, LABURIC in the last 8760 hours.  Objective:  VS:  HT:    WT:   BMI:     BP:116/69  HR:(!) 58bpm  TEMP: ( )  RESP:  Physical Exam  Constitutional: He is oriented to person, place, and time. He appears well-developed and well-nourished. No distress.  HENT:  Head: Normocephalic and atraumatic.  Nose: Nose normal.  Mouth/Throat: Oropharynx is clear and moist.  Eyes: Pupils are equal, round, and reactive to light. Conjunctivae are normal.  Neck: Normal range of motion. Neck supple. No tracheal deviation present.  Cardiovascular: Regular rhythm and intact distal pulses.  Pulmonary/Chest: Effort normal and breath sounds  normal.  Abdominal: Soft. He exhibits no distension. There is no rebound and no guarding.  Musculoskeletal: He exhibits no deformity.  Patient ambulates without aid.  He stands with some difficulty in the full extension with pain on extension and facet loading.  Some pain in the paraspinal quadratus lumborum region.  No pain over the greater trochanters and no pain with hip rotation.  He has good distal strength.  Neurological: He is alert and oriented to person, place, and time. He exhibits normal muscle tone. Coordination normal.  Skin: Skin is warm. No rash noted.  Psychiatric: He has a normal mood and affect. His behavior is normal.  Nursing note and vitals reviewed.   Ortho Exam Imaging: No results found.  Past Medical/Family/Surgical/Social History: Medications & Allergies reviewed per EMR, new medications updated. Patient Active Problem List   Diagnosis Date Noted  . Chronic pain  of both knees 10/03/2017  . Chronic pain syndrome 10/03/2017  . Status post left knee replacement 10/03/2017  . Spondylosis without myelopathy or radiculopathy, lumbar region 05/17/2017  . Chronic bilateral low back pain without sciatica 05/17/2017  . Chest tightness 02/21/2017  . Cough 02/21/2017  . Osteoarthritis 02/21/2017  . Primary osteoarthritis of left knee 11/09/2015  . S/P total knee replacement using cement 11/09/2015   Past Medical History:  Diagnosis Date  . Arthritis   . Basal cell carcinoma   . BPH (benign prostatic hypertrophy) with urinary obstruction   . Chest tightness 02/21/2017  . Chronic bilateral low back pain without sciatica 05/17/2017  . Chronic pain of both knees 10/03/2017  . Chronic pain syndrome 10/03/2017  . Cough 02/21/2017  . Diverticulosis   . Headache   . Hyperlipidemia   . Hypertension   . Hypogonadism male   . Internal hemorrhoids   . OA (osteoarthritis)   . Osteoarthritis 02/21/2017  . Paroxysmal atrial fibrillation (HCC)   . Primary osteoarthritis of  left knee 11/09/2015  . S/P total knee replacement using cement 11/09/2015  . Spondylosis without myelopathy or radiculopathy, lumbar region 05/17/2017  . Status post left knee replacement 10/03/2017   Overview:  Added automatically from request for surgery [id number]   Family History  Problem Relation Age of Onset  . Heart attack Father   . CVA Brother    Past Surgical History:  Procedure Laterality Date  . ATRIAL FIBRILLATION ABLATION N/A 11/16/2017   Procedure: ATRIAL FIBRILLATION ABLATION;  Surgeon: Thompson Grayer, MD;  Location: [facility];  Service: Cardiovascular;  Laterality: N/A;  . CARPAL TUNNEL RELEASE     left  . HIP ARTHROPLASTY    . JOINT REPLACEMENT    . KNEE ARTHROPLASTY     right  . KNEE SURGERY    . SHOULDER SURGERY     bilateral  . TOTAL KNEE ARTHROPLASTY     left  . TOTAL KNEE ARTHROPLASTY Left 11/09/2015   Procedure: TOTAL KNEE ARTHROPLASTY;  Surgeon: Garald Balding, MD;  Location: [facility];  Service: Orthopedics;  Laterality: Left;   Social History   Occupational History  . Occupation: Sales  Tobacco Use  . Smoking status: Never Smoker  . Smokeless tobacco: Never Used  Substance and Sexual Activity  . Alcohol use: Yes    Comment: 1 mixed drink per day  . Drug use: No  . Sexual activity: Not on file

## 2018-11-13 ENCOUNTER — Ambulatory Visit: Payer: Medicare HMO | Admitting: Cardiology

## 2018-11-13 ENCOUNTER — Encounter: Payer: Self-pay | Admitting: Cardiology

## 2018-11-13 VITALS — BP 160/76 | HR 80 | Ht 70.0 in | Wt 165.0 lb

## 2018-11-13 DIAGNOSIS — G4733 Obstructive sleep apnea (adult) (pediatric): Secondary | ICD-10-CM

## 2018-11-13 DIAGNOSIS — R001 Bradycardia, unspecified: Secondary | ICD-10-CM | POA: Diagnosis not present

## 2018-11-13 HISTORY — DX: Obstructive sleep apnea (adult) (pediatric): G47.33

## 2018-11-13 NOTE — Patient Instructions (Signed)

## 2018-11-13 NOTE — Progress Notes (Signed)
Cardiology Office Note:    Date:  11/13/2018   ID:  [name], DOB [birth year], MRN [id number]  PCP:  Reynold Bowen, MD  Cardiologist:  No primary care provider on file.    Referring MD: Reynold Bowen, MD   Chief Complaint  Patient presents with  . Sleep Apnea    History of Present Illness:    Alan Knight. is a 72 y.o. male with a hx of hypertension, hyperlipidemia and paroxysmal atrial fibrillation.  He was referred for sleep study due to fatigue and snoring.  He was found to have moderate obstructive sleep apnea with an AHI of 19.9/h with no central sleep apnea.  Oxygen desaturations were as low as 89%.  He underwent CPAP titration but could not be adequately titrated to doing ongoing respiratory events.  He underwent BiPAP titration to 18/14cm H2O and is now here for follow-up.  He is doing well with his BiPAP device and thinks that he has gotten used to it.  He tolerates the mask and feels the pressure is adequate.  Since going on BiPAP he feels rested in the am and has no significant daytime sleepiness.  He denies any significant mouth or nasal dryness or nasal congestion.  He does not think that he snores.     Past Medical History:  Diagnosis Date  . Arthritis   . Basal cell carcinoma   . BPH (benign prostatic hypertrophy) with urinary obstruction   . Chest tightness 02/21/2017  . Chronic bilateral low back pain without sciatica 05/17/2017  . Chronic pain of both knees 10/03/2017  . Chronic pain syndrome 10/03/2017  . Cough 02/21/2017  . Diverticulosis   . Headache   . Hyperlipidemia   . Hypertension   . Hypogonadism male   . Internal hemorrhoids   . OA (osteoarthritis)   . OSA treated with BiPAP 11/13/2018   moderate obstructive sleep apnea with an AHI of 19.9/h with no central sleep apnea.  Oxygen desaturations were as low as 89%.  Now on BIPAP at 18/14cm H2O.  . Osteoarthritis 02/21/2017  . Paroxysmal atrial fibrillation (HCC)   . Primary osteoarthritis  of left knee 11/09/2015  . S/P total knee replacement using cement 11/09/2015  . Spondylosis without myelopathy or radiculopathy, lumbar region 05/17/2017  . Status post left knee replacement 10/03/2017   Overview:  Added automatically from request for surgery [id number]    Past Surgical History:  Procedure Laterality Date  . ATRIAL FIBRILLATION ABLATION N/A 11/16/2017   Procedure: ATRIAL FIBRILLATION ABLATION;  Surgeon: Thompson Grayer, MD;  Location: [facility];  Service: Cardiovascular;  Laterality: N/A;  . CARPAL TUNNEL RELEASE     left  . HIP ARTHROPLASTY    . JOINT REPLACEMENT    . KNEE ARTHROPLASTY     right  . KNEE SURGERY    . SHOULDER SURGERY     bilateral  . TOTAL KNEE ARTHROPLASTY     left  . TOTAL KNEE ARTHROPLASTY Left 11/09/2015   Procedure: TOTAL KNEE ARTHROPLASTY;  Surgeon: Garald Balding, MD;  Location: [facility];  Service: Orthopedics;  Laterality: Left;    Current Medications: Current Meds  Medication Sig  . acetaminophen (TYLENOL) 325 MG tablet Take 650 mg every 6 (six) hours as needed by mouth for moderate pain or headache.  . Ascorbic Acid (VITAMIN C) 1000 MG tablet Take 1,000 mg by mouth daily.  . calcium acetate (PHOSLO) 667 MG capsule Take 500 mg by mouth daily.  Marland Kitchen  Cholecalciferol (VITAMIN D3) 5000 UNITS CAPS Take 5,000 Units daily by mouth.   . co-enzyme Q-10 30 MG capsule Take 300 mg by mouth daily.  . DULoxetine (CYMBALTA) 60 MG capsule Take 1 capsule (60 mg total) by mouth daily.  Marland Kitchen ELIQUIS 5 MG TABS tablet TAKE 1 TABLET BY MOUTH TWICE DAILY.  Marland Kitchen ezetimibe (ZETIA) 10 MG tablet Take 10 mg by mouth daily.  Marland Kitchen glucosamine-chondroitin 500-400 MG tablet Take 1 tablet by mouth daily.  Marland Kitchen lisinopril (PRINIVIL,ZESTRIL) 5 MG tablet Take 5 mg by mouth daily.  . Multiple Minerals-Vitamins (CAL-MAG-ZINC-D) TABS Take 1 tablet daily by mouth.  . naproxen (NAPROSYN) 500 MG tablet Take 500 mg by mouth 2 (two) times a week.  . pregabalin (LYRICA) 75 MG capsule Take  75 mg by mouth 2 (two) times daily.  . simvastatin (ZOCOR) 40 MG tablet Take 40 mg by mouth daily.  . traMADol (ULTRAM) 50 MG tablet TAKE 1 TABLET BY MOUTH EVERY 12 HOURS AS NEEDED  . zonisamide (ZONEGRAN) 50 MG capsule Take 50 mg by mouth daily.     Allergies:   Patient has no known allergies.   Social History   Socioeconomic History  . Marital status: Married    Spouse name: Not on file  . Number of children: Not on file  . Years of education: Not on file  . Highest education level: Not on file  Occupational History  . Occupation: Press photographer  Social Needs  . Financial resource strain: Not on file  . Food insecurity:    Worry: Not on file    Inability: Not on file  . Transportation needs:    Medical: Not on file    Non-medical: Not on file  Tobacco Use  . Smoking status: Never Smoker  . Smokeless tobacco: Never Used  Substance and Sexual Activity  . Alcohol use: Yes    Comment: 1 mixed drink per day  . Drug use: No  . Sexual activity: Not on file  Lifestyle  . Physical activity:    Days per week: Not on file    Minutes per session: Not on file  . Stress: Not on file  Relationships  . Social connections:    Talks on phone: Not on file    Gets together: Not on file    Attends religious service: Not on file    Active member of club or organization: Not on file    Attends meetings of clubs or organizations: Not on file    Relationship status: Not on file  Other Topics Concern  . Not on file  Social History Narrative   Lives in [place]   retired     Family History: The patient's family history includes CVA in his brother; Heart attack in his father.  ROS:   Please see the history of present illness.    ROS  All other systems reviewed and negative.   EKGs/Labs/Other Studies Reviewed:    The following studies were reviewed today: PAP download  EKG:  EKG is not ordered today.    Recent Labs: No results found for requested labs within last 8760 hours.    Recent Lipid Panel No results found for: CHOL, TRIG, HDL, CHOLHDL, VLDL, LDLCALC, LDLDIRECT  Physical Exam:    VS:  BP (!) 160/76   Pulse 80   Ht 5\' 10"  (1.778 m)   Wt 165 lb (74.8 kg)   BMI 23.68 kg/m     Wt Readings from Last 3 Encounters:  11/13/18 165  lb (74.8 kg)  06/17/18 162 lb (73.5 kg)  06/17/18 164 lb (74.4 kg)     GEN:  Well nourished, well developed in no acute distress HEENT: Normal NECK: No JVD; No carotid bruits LYMPHATICS: No lymphadenopathy CARDIAC: RRR, no murmurs, rubs, gallops RESPIRATORY:  Clear to auscultation without rales, wheezing or rhonchi  ABDOMEN: Soft, non-tender, non-distended MUSCULOSKELETAL:  No edema; No deformity  SKIN: Warm and dry NEUROLOGIC:  Alert and oriented x 3 PSYCHIATRIC:  Normal affect   ASSESSMENT:    1. OSA treated with BiPAP    PLAN:    In order of problems listed above:  1.  OSA - the patient is tolerating PAP therapy well without any problems. The PAP download was reviewed today and showed an AHI of 3.4/hr on 18/14 cm H2O with 73% compliance in using more than 4 hours nightly.  The patient has been using and benefiting from PAP use and will continue to benefit from therapy.    Medication Adjustments/Labs and Tests Ordered: Current medicines are reviewed at length with the patient today.  Concerns regarding medicines are outlined above.  No orders of the defined types were placed in this encounter.  No orders of the defined types were placed in this encounter.   Signed, Alan Him, MD  11/13/2018 11:12 AM    Alan Knight

## 2018-11-18 ENCOUNTER — Encounter: Payer: Self-pay | Admitting: Internal Medicine

## 2018-11-18 ENCOUNTER — Ambulatory Visit: Payer: Medicare HMO | Admitting: Internal Medicine

## 2018-11-18 VITALS — BP 138/72 | HR 60 | Ht 70.0 in | Wt 160.4 lb

## 2018-11-18 DIAGNOSIS — G4733 Obstructive sleep apnea (adult) (pediatric): Secondary | ICD-10-CM | POA: Diagnosis not present

## 2018-11-18 DIAGNOSIS — I480 Paroxysmal atrial fibrillation: Secondary | ICD-10-CM

## 2018-11-18 DIAGNOSIS — R001 Bradycardia, unspecified: Secondary | ICD-10-CM

## 2018-11-18 NOTE — Patient Instructions (Signed)
Medication Instructions:  Your physician recommends that you continue on your current medications as directed. Please refer to the Current Medication list given to you today.  Labwork: None ordered.  Testing/Procedures: None ordered.  Follow-Up: Your physician wants you to follow-up in: 6 months with Roderic Palau NP at the afib clinic.   You will receive a reminder letter in the mail two months in advance. If you don't receive a letter, please call our office to schedule the follow-up appointment.  Any Other Special Instructions Will Be Listed Below (If Applicable).  If you need a refill on your cardiac medications before your next appointment, please call your pharmacy.

## 2018-11-18 NOTE — Progress Notes (Signed)
PCP: Reynold Bowen, MD   Primary EP: Dr Gerlene Burdock. is a 73 y.o. male who presents today for routine electrophysiology followup.  Since last being seen in our clinic, the patient reports doing very well.  Today, he denies symptoms of palpitations, chest pain, shortness of breath,  lower extremity edema, dizziness, presyncope, or syncope.  The patient is otherwise without complaint today.   Past Medical History:  Diagnosis Date  . Arthritis   . Basal cell carcinoma   . BPH (benign prostatic hypertrophy) with urinary obstruction   . Chest tightness 02/21/2017  . Chronic bilateral low back pain without sciatica 05/17/2017  . Chronic pain of both knees 10/03/2017  . Chronic pain syndrome 10/03/2017  . Cough 02/21/2017  . Diverticulosis   . Headache   . Hyperlipidemia   . Hypertension   . Hypogonadism male   . Internal hemorrhoids   . OA (osteoarthritis)   . OSA treated with BiPAP 11/13/2018   moderate obstructive sleep apnea with an AHI of 19.9/h with no central sleep apnea.  Oxygen desaturations were as low as 89%.  Now on BIPAP at 18/14cm H2O.  . Osteoarthritis 02/21/2017  . Paroxysmal atrial fibrillation (HCC)   . Primary osteoarthritis of left knee 11/09/2015  . S/P total knee replacement using cement 11/09/2015  . Spondylosis without myelopathy or radiculopathy, lumbar region 05/17/2017  . Status post left knee replacement 10/03/2017   Overview:  Added automatically from request for surgery [id number]   Past Surgical History:  Procedure Laterality Date  . ATRIAL FIBRILLATION ABLATION N/A 11/16/2017   Procedure: ATRIAL FIBRILLATION ABLATION;  Surgeon: Thompson Grayer, MD;  Location: [facility];  Service: Cardiovascular;  Laterality: N/A;  . CARPAL TUNNEL RELEASE     left  . HIP ARTHROPLASTY    . JOINT REPLACEMENT    . KNEE ARTHROPLASTY     right  . KNEE SURGERY    . SHOULDER SURGERY     bilateral  . TOTAL KNEE ARTHROPLASTY     left  . TOTAL KNEE  ARTHROPLASTY Left 11/09/2015   Procedure: TOTAL KNEE ARTHROPLASTY;  Surgeon: Garald Balding, MD;  Location: [facility];  Service: Orthopedics;  Laterality: Left;    ROS- all systems are reviewed and negatives except as per HPI above  Current Outpatient Medications  Medication Sig Dispense Refill  . acetaminophen (TYLENOL) 325 MG tablet Take 650 mg every 6 (six) hours as needed by mouth for moderate pain or headache.    . Ascorbic Acid (VITAMIN C) 1000 MG tablet Take 1,000 mg by mouth daily.    . calcium acetate (PHOSLO) 667 MG capsule Take 500 mg by mouth daily.    . Cholecalciferol (VITAMIN D3) 5000 UNITS CAPS Take 5,000 Units daily by mouth.     . co-enzyme Q-10 30 MG capsule Take 300 mg by mouth daily.    . DULoxetine (CYMBALTA) 60 MG capsule Take 1 capsule (60 mg total) by mouth daily. 30 capsule 3  . ELIQUIS 5 MG TABS tablet TAKE 1 TABLET BY MOUTH TWICE DAILY. 180 tablet 1  . ezetimibe (ZETIA) 10 MG tablet Take 10 mg by mouth daily.    Marland Kitchen glucosamine-chondroitin 500-400 MG tablet Take 1 tablet by mouth daily.    Marland Kitchen lisinopril (PRINIVIL,ZESTRIL) 5 MG tablet Take 5 mg by mouth daily.    . Multiple Minerals-Vitamins (CAL-MAG-ZINC-D) TABS Take 1 tablet daily by mouth.    . naproxen (NAPROSYN) 500 MG tablet Take 500 mg  by mouth 2 (two) times a week.    . pregabalin (LYRICA) 75 MG capsule Take 75 mg by mouth 2 (two) times daily.    . simvastatin (ZOCOR) 40 MG tablet Take 40 mg by mouth daily.    . traMADol (ULTRAM) 50 MG tablet TAKE 1 TABLET BY MOUTH EVERY 12 HOURS AS NEEDED 30 tablet 0  . zonisamide (ZONEGRAN) 50 MG capsule Take 50 mg by mouth daily.     No current facility-administered medications for this visit.     Physical Exam: Vitals:   11/18/18 1232  BP: 138/72  Pulse: 60  SpO2: 98%  Weight: 160 lb 6.4 oz (72.8 kg)  Height: 5\' 10"  (1.778 m)    GEN- The patient is well appearing, alert and oriented x 3 today.   Head- normocephalic, atraumatic Eyes-  Sclera clear, conjunctiva  pink Ears- hearing intact Oropharynx- clear Lungs- Clear to ausculation bilaterally, normal work of breathing Heart- Regular rate and rhythm, no murmurs, rubs or gallops, PMI not laterally displaced GI- soft, NT, ND, + BS Extremities- no clubbing, cyanosis, or edema  Wt Readings from Last 3 Encounters:  11/18/18 160 lb 6.4 oz (72.8 kg)  11/13/18 165 lb (74.8 kg)  06/17/18 162 lb (73.5 kg)    EKG tracing ordered today is personally reviewed and shows sinus rhythm with PACs  Assessment and Plan:  1. Paroxysmal atrial fibrillation Doing well post ablation off AAD therapy chasd2vasc score is 2.  continue eliquis He has back pain and wishes to take NSAIDs.  I have advised to use them sparingly.  Could consider ILR to monitor for AF to guide anticoagulation going forward  2. Sinus bradycardia Improved  3. OSA compliant with CPAP  Return to see Butch Penny in AF clinic in 6 months   Thompson Grayer MD, [facility] 11/18/2018 12:50 PM

## 2018-11-19 DIAGNOSIS — G4733 Obstructive sleep apnea (adult) (pediatric): Secondary | ICD-10-CM | POA: Diagnosis not present

## 2018-11-25 ENCOUNTER — Telehealth (INDEPENDENT_AMBULATORY_CARE_PROVIDER_SITE_OTHER): Payer: Self-pay | Admitting: Physical Medicine and Rehabilitation

## 2018-11-25 ENCOUNTER — Other Ambulatory Visit (INDEPENDENT_AMBULATORY_CARE_PROVIDER_SITE_OTHER): Payer: Self-pay | Admitting: Physical Medicine and Rehabilitation

## 2018-11-25 DIAGNOSIS — M48061 Spinal stenosis, lumbar region without neurogenic claudication: Secondary | ICD-10-CM

## 2018-11-25 DIAGNOSIS — M5136 Other intervertebral disc degeneration, lumbar region: Secondary | ICD-10-CM

## 2018-11-25 DIAGNOSIS — M47816 Spondylosis without myelopathy or radiculopathy, lumbar region: Secondary | ICD-10-CM

## 2018-11-25 NOTE — Telephone Encounter (Signed)
Patients wife notified

## 2018-11-25 NOTE — Telephone Encounter (Signed)
Referral placed to Dr. Ellene Route

## 2018-11-26 ENCOUNTER — Ambulatory Visit (INDEPENDENT_AMBULATORY_CARE_PROVIDER_SITE_OTHER): Payer: Medicare HMO | Admitting: Physical Medicine and Rehabilitation

## 2018-12-02 DIAGNOSIS — G8929 Other chronic pain: Secondary | ICD-10-CM | POA: Diagnosis not present

## 2018-12-02 DIAGNOSIS — G894 Chronic pain syndrome: Secondary | ICD-10-CM | POA: Diagnosis not present

## 2018-12-02 DIAGNOSIS — M25562 Pain in left knee: Secondary | ICD-10-CM | POA: Diagnosis not present

## 2018-12-02 DIAGNOSIS — Z96651 Presence of right artificial knee joint: Secondary | ICD-10-CM | POA: Diagnosis not present

## 2018-12-02 DIAGNOSIS — M25561 Pain in right knee: Secondary | ICD-10-CM | POA: Diagnosis not present

## 2018-12-02 DIAGNOSIS — Z96652 Presence of left artificial knee joint: Secondary | ICD-10-CM | POA: Diagnosis not present

## 2018-12-02 DIAGNOSIS — R29898 Other symptoms and signs involving the musculoskeletal system: Secondary | ICD-10-CM | POA: Diagnosis not present

## 2018-12-03 ENCOUNTER — Telehealth: Payer: Self-pay | Admitting: *Deleted

## 2018-12-03 NOTE — Telephone Encounter (Signed)
   [facility] Pre-operative Risk Assessment    Request for surgical clearance:  1. What type of surgery is being performed? RIGHT GENICULAR RFA ON 12/10/18; LEFT GENICULAR RFA ON 01/09/19  2. When is this surgery scheduled? 12/10/18 AND 01/09/19  3. Are there any medications that need to be held prior to surgery and how long? ELIQUIS   4. Practice name and name of physician performing surgery? [facility]; DR. Anibal Henderson PATEL   5. What is your office phone and fax number? PH# [phone]; FAX# [phone]   6. Anesthesia type (None, local, MAC, general) ? NONE LISTED. I CALLED TO VERIFY IF ANY ANESTHESIA   Alan Knight 12/03/2018, 11:53 AM  _________________________________________________________________   (provider comments below)

## 2018-12-03 NOTE — Telephone Encounter (Signed)
Patient with diagnosis of Afib on Eliquis for anticoagulation.    Procedure: genicular rfa Date of procedure: 12/10/18 and 01/09/18  CHADS2-VASc score of  2 (CHF, HTN, AGE, DM2, stroke/tia x 2, CAD, AGE, male)  CrCl 27ml/min  Per office protocol, patient can hold Eliquis for  1-2 days prior to procedure.

## 2018-12-04 NOTE — Telephone Encounter (Signed)
I s/w [facility], RN who states pt will have conscious sedation. Alan Knight asked would they be able to notified by tomorrow if the pt has been cleared.

## 2018-12-04 NOTE — Telephone Encounter (Signed)
   Primary Cardiologist: Dr. Clair Gulling  Chart reviewed as part of pre-operative protocol coverage. Given past medical history and was recently seen on 11/18/18 by Dr. Rayann Heman, based on ACC/AHA guidelines, Domenik Trice. would be at acceptable risk for the planned procedure without further cardiovascular testing. He has has a history of PAF status post ablation and off diarrhea rhythmic therapy, maintaining sinus rhythm.  According to our pharmacy protocol:         Patient with diagnosis of Afib on Eliquis for anticoagulation.    Procedure: genicular rfa Date of procedure: 12/10/18 and 01/09/18  CHADS2-VASc score of  2 (CHF, HTN, AGE, DM2, stroke/tia x 2, CAD, AGE, male)  CrCl 53ml/min  Per office protocol, patient can hold Eliquis for  1-2 days prior to procedure.         I will route this recommendation to the requesting party via Epic fax function and remove from pre-op pool.  Please call with questions.  Daune Perch, NP 12/04/2018, 4:11 PM

## 2018-12-09 DIAGNOSIS — G43809 Other migraine, not intractable, without status migrainosus: Secondary | ICD-10-CM | POA: Diagnosis not present

## 2018-12-11 DIAGNOSIS — M25561 Pain in right knee: Secondary | ICD-10-CM | POA: Diagnosis not present

## 2018-12-11 DIAGNOSIS — R009 Unspecified abnormalities of heart beat: Secondary | ICD-10-CM | POA: Diagnosis not present

## 2018-12-11 DIAGNOSIS — Z96651 Presence of right artificial knee joint: Secondary | ICD-10-CM | POA: Diagnosis not present

## 2018-12-11 DIAGNOSIS — G894 Chronic pain syndrome: Secondary | ICD-10-CM | POA: Diagnosis not present

## 2018-12-11 DIAGNOSIS — Z96653 Presence of artificial knee joint, bilateral: Secondary | ICD-10-CM | POA: Diagnosis not present

## 2018-12-12 DIAGNOSIS — I10 Essential (primary) hypertension: Secondary | ICD-10-CM | POA: Diagnosis not present

## 2018-12-12 DIAGNOSIS — M4156 Other secondary scoliosis, lumbar region: Secondary | ICD-10-CM | POA: Diagnosis not present

## 2018-12-19 ENCOUNTER — Other Ambulatory Visit: Payer: Self-pay | Admitting: Neurological Surgery

## 2018-12-19 DIAGNOSIS — G4733 Obstructive sleep apnea (adult) (pediatric): Secondary | ICD-10-CM | POA: Diagnosis not present

## 2018-12-19 DIAGNOSIS — M419 Scoliosis, unspecified: Secondary | ICD-10-CM

## 2019-01-01 DIAGNOSIS — Z13820 Encounter for screening for osteoporosis: Secondary | ICD-10-CM | POA: Diagnosis not present

## 2019-01-02 ENCOUNTER — Other Ambulatory Visit (HOSPITAL_COMMUNITY): Payer: Self-pay | Admitting: Internal Medicine

## 2019-01-02 DIAGNOSIS — M25562 Pain in left knee: Secondary | ICD-10-CM | POA: Diagnosis not present

## 2019-01-02 DIAGNOSIS — G8929 Other chronic pain: Secondary | ICD-10-CM | POA: Diagnosis not present

## 2019-01-02 DIAGNOSIS — Z96651 Presence of right artificial knee joint: Secondary | ICD-10-CM | POA: Diagnosis not present

## 2019-01-02 DIAGNOSIS — Z96652 Presence of left artificial knee joint: Secondary | ICD-10-CM | POA: Diagnosis not present

## 2019-01-02 DIAGNOSIS — M25561 Pain in right knee: Secondary | ICD-10-CM | POA: Diagnosis not present

## 2019-01-02 DIAGNOSIS — G894 Chronic pain syndrome: Secondary | ICD-10-CM | POA: Diagnosis not present

## 2019-01-02 NOTE — Telephone Encounter (Signed)
Eliquis 5mg  refill request received; pt is 73 yrs old, wt-72.8kg, Crea-0.90 via [facility] on 07/08/2018, last seen by [facility] on 11/18/2018; will send in refill to requested pharmacy.

## 2019-01-06 ENCOUNTER — Encounter (INDEPENDENT_AMBULATORY_CARE_PROVIDER_SITE_OTHER): Payer: Self-pay | Admitting: Orthopaedic Surgery

## 2019-01-06 ENCOUNTER — Ambulatory Visit (INDEPENDENT_AMBULATORY_CARE_PROVIDER_SITE_OTHER): Payer: Medicare HMO | Admitting: Orthopaedic Surgery

## 2019-01-06 VITALS — BP 123/87 | HR 64 | Ht 70.0 in | Wt 160.0 lb

## 2019-01-06 DIAGNOSIS — M65332 Trigger finger, left middle finger: Secondary | ICD-10-CM | POA: Diagnosis not present

## 2019-01-06 DIAGNOSIS — R69 Illness, unspecified: Secondary | ICD-10-CM | POA: Diagnosis not present

## 2019-01-06 MED ORDER — METHYLPREDNISOLONE ACETATE 40 MG/ML IJ SUSP
20.0000 mg | INTRAMUSCULAR | Status: AC | PRN
  Administered 2019-01-06: 20 mg

## 2019-01-06 MED ORDER — LIDOCAINE HCL 1 % IJ SOLN
0.5000 mL | INTRAMUSCULAR | Status: AC | PRN
  Administered 2019-01-06: .5 mL

## 2019-01-06 NOTE — Progress Notes (Signed)
Office Visit Note   Patient: Alan Knight.           Date of Birth: 04-26-1946           MRN: [id number] Visit Date: 01/06/2019              Requested by: Reynold Bowen, MD [place], [place] [address] PCP: Reynold Bowen, MD   Assessment & Plan: Visit Diagnoses:  1. Trigger finger, left middle finger     Plan: Left long trigger finger.  Will inject with cortisone and monitor response  Follow-Up Instructions: Return if symptoms worsen or fail to improve.   Orders:  No orders of the defined types were placed in this encounter.  No orders of the defined types were placed in this encounter.     Procedures: Hand/UE Inj: L long A1 for trigger finger on 01/06/2019 1:05 PM Medications: 0.5 mL lidocaine 1 %; 20 mg methylPREDNISolone acetate 40 MG/ML      Clinical Data: No additional findings.   Subjective: Chief Complaint  Patient presents with  . Left Hand - Pain  . Leg Pain    Pt stated leg --injury 4 months ago....sore and just wanted to ask question.  Alan Knight is 73 years old accompanied by his wife and here for evaluation of left long finger triggering.  He has had a similar problem on the right hand with good response to cortisone.  He denies any injury or trauma but can actively trigger the finger.  He also relates having onset of atrial fibrillation.  He had an ablation performed at [facility] with good results but is on Eliquis.  He has had to stop taking Naprosyn which was really helping multiple joint arthralgias.  He was referred to a pain clinic through the [facility] in [place] where he received nerve injections about both knees and "burning of the nerves".  As result he is done very well no longer has pain  HPI  Review of Systems   Objective: Vital Signs: BP 123/87 (BP Location: Left Arm, Patient Position: Sitting, Cuff Size: Normal)   Pulse 64   Ht 5\' 10"  (1.778 m)   Wt 160 lb (72.6 kg)   BMI 22.96 kg/m    Physical Exam Constitutional:      Appearance: He is well-developed.  Eyes:     Pupils: Pupils are equal, round, and reactive to light.  Pulmonary:     Effort: Pulmonary effort is normal.  Skin:    General: Skin is warm and dry.  Neurological:     Mental Status: He is alert and oriented to person, place, and time.  Psychiatric:        Behavior: Behavior normal.     Ortho Exam awake alert and oriented x3.  Comfortable sitting.  Active triggering of the left long finger with a painful nodule on the palmar aspect of the finger at the level of the metacarpal phalangeal joint.  Neurovascular exam intact.  No pain or swelling or effusion of either knee.  Full extension just over 95 degrees of flexion bilaterally  Specialty Comments:  No specialty comments available.  Imaging: No results found.   PMFS History: Patient Active Problem List   Diagnosis Date Noted  . OSA treated with BiPAP 11/13/2018  . Chronic pain of both knees 10/03/2017  . Chronic pain syndrome 10/03/2017  . Status post left knee replacement 10/03/2017  . Spondylosis without myelopathy or radiculopathy, lumbar region 05/17/2017  .  Chronic bilateral low back pain without sciatica 05/17/2017  . Chest tightness 02/21/2017  . Cough 02/21/2017  . Osteoarthritis 02/21/2017  . Primary osteoarthritis of left knee 11/09/2015  . S/P total knee replacement using cement 11/09/2015   Past Medical History:  Diagnosis Date  . Arthritis   . Basal cell carcinoma   . BPH (benign prostatic hypertrophy) with urinary obstruction   . Chest tightness 02/21/2017  . Chronic bilateral low back pain without sciatica 05/17/2017  . Chronic pain of both knees 10/03/2017  . Chronic pain syndrome 10/03/2017  . Cough 02/21/2017  . Diverticulosis   . Headache   . Hyperlipidemia   . Hypertension   . Hypogonadism male   . Internal hemorrhoids   . OA (osteoarthritis)   . OSA treated with BiPAP 11/13/2018   moderate obstructive sleep  apnea with an AHI of 19.9/h with no central sleep apnea.  Oxygen desaturations were as low as 89%.  Now on BIPAP at 18/14cm H2O.  . Osteoarthritis 02/21/2017  . Paroxysmal atrial fibrillation (HCC)   . Primary osteoarthritis of left knee 11/09/2015  . S/P total knee replacement using cement 11/09/2015  . Spondylosis without myelopathy or radiculopathy, lumbar region 05/17/2017  . Status post left knee replacement 10/03/2017   Overview:  Added automatically from request for surgery [id number]    Family History  Problem Relation Age of Onset  . Heart attack Father   . CVA Brother     Past Surgical History:  Procedure Laterality Date  . ATRIAL FIBRILLATION ABLATION N/A 11/16/2017   Procedure: ATRIAL FIBRILLATION ABLATION;  Surgeon: Thompson Grayer, MD;  Location: [facility];  Service: Cardiovascular;  Laterality: N/A;  . CARPAL TUNNEL RELEASE     left  . HIP ARTHROPLASTY    . JOINT REPLACEMENT    . KNEE ARTHROPLASTY     right  . KNEE SURGERY    . SHOULDER SURGERY     bilateral  . TOTAL KNEE ARTHROPLASTY     left  . TOTAL KNEE ARTHROPLASTY Left 11/09/2015   Procedure: TOTAL KNEE ARTHROPLASTY;  Surgeon: Garald Balding, MD;  Location: [facility];  Service: Orthopedics;  Laterality: Left;   Social History   Occupational History  . Occupation: Sales  Tobacco Use  . Smoking status: Never Smoker  . Smokeless tobacco: Never Used  Substance and Sexual Activity  . Alcohol use: Yes    Comment: 1 mixed drink per day  . Drug use: No  . Sexual activity: Not on file     Garald Balding, MD   Note - This record has been created using [employer].  Chart creation errors have been sought, but may not always  have been located. Such creation errors do not reflect on  the standard of medical care.

## 2019-01-09 DIAGNOSIS — Z96652 Presence of left artificial knee joint: Secondary | ICD-10-CM | POA: Diagnosis not present

## 2019-01-09 DIAGNOSIS — Z79899 Other long term (current) drug therapy: Secondary | ICD-10-CM | POA: Diagnosis not present

## 2019-01-09 DIAGNOSIS — Z96653 Presence of artificial knee joint, bilateral: Secondary | ICD-10-CM | POA: Diagnosis not present

## 2019-01-09 DIAGNOSIS — M25562 Pain in left knee: Secondary | ICD-10-CM | POA: Diagnosis not present

## 2019-01-09 DIAGNOSIS — G894 Chronic pain syndrome: Secondary | ICD-10-CM | POA: Diagnosis not present

## 2019-01-13 DIAGNOSIS — M25569 Pain in unspecified knee: Secondary | ICD-10-CM | POA: Diagnosis not present

## 2019-01-13 DIAGNOSIS — R413 Other amnesia: Secondary | ICD-10-CM | POA: Diagnosis not present

## 2019-01-13 DIAGNOSIS — N401 Enlarged prostate with lower urinary tract symptoms: Secondary | ICD-10-CM | POA: Diagnosis not present

## 2019-01-13 DIAGNOSIS — K219 Gastro-esophageal reflux disease without esophagitis: Secondary | ICD-10-CM | POA: Diagnosis not present

## 2019-01-13 DIAGNOSIS — M48061 Spinal stenosis, lumbar region without neurogenic claudication: Secondary | ICD-10-CM | POA: Diagnosis not present

## 2019-01-13 DIAGNOSIS — I480 Paroxysmal atrial fibrillation: Secondary | ICD-10-CM | POA: Diagnosis not present

## 2019-01-13 DIAGNOSIS — G4733 Obstructive sleep apnea (adult) (pediatric): Secondary | ICD-10-CM | POA: Diagnosis not present

## 2019-01-13 DIAGNOSIS — E7849 Other hyperlipidemia: Secondary | ICD-10-CM | POA: Diagnosis not present

## 2019-01-13 DIAGNOSIS — G894 Chronic pain syndrome: Secondary | ICD-10-CM | POA: Diagnosis not present

## 2019-01-13 DIAGNOSIS — R7309 Other abnormal glucose: Secondary | ICD-10-CM | POA: Diagnosis not present

## 2019-01-14 ENCOUNTER — Ambulatory Visit (INDEPENDENT_AMBULATORY_CARE_PROVIDER_SITE_OTHER): Payer: Medicare HMO | Admitting: Orthopaedic Surgery

## 2019-01-14 DIAGNOSIS — M4156 Other secondary scoliosis, lumbar region: Secondary | ICD-10-CM | POA: Diagnosis not present

## 2019-01-14 DIAGNOSIS — M5136 Other intervertebral disc degeneration, lumbar region: Secondary | ICD-10-CM | POA: Diagnosis not present

## 2019-01-16 DIAGNOSIS — M5136 Other intervertebral disc degeneration, lumbar region: Secondary | ICD-10-CM | POA: Diagnosis not present

## 2019-01-16 DIAGNOSIS — M4156 Other secondary scoliosis, lumbar region: Secondary | ICD-10-CM | POA: Diagnosis not present

## 2019-01-19 DIAGNOSIS — G4733 Obstructive sleep apnea (adult) (pediatric): Secondary | ICD-10-CM | POA: Diagnosis not present

## 2019-01-20 DIAGNOSIS — M4156 Other secondary scoliosis, lumbar region: Secondary | ICD-10-CM | POA: Diagnosis not present

## 2019-01-20 DIAGNOSIS — M5136 Other intervertebral disc degeneration, lumbar region: Secondary | ICD-10-CM | POA: Diagnosis not present

## 2019-01-22 DIAGNOSIS — M4156 Other secondary scoliosis, lumbar region: Secondary | ICD-10-CM | POA: Diagnosis not present

## 2019-01-22 DIAGNOSIS — M5136 Other intervertebral disc degeneration, lumbar region: Secondary | ICD-10-CM | POA: Diagnosis not present

## 2019-01-23 DIAGNOSIS — M5136 Other intervertebral disc degeneration, lumbar region: Secondary | ICD-10-CM | POA: Diagnosis not present

## 2019-01-23 DIAGNOSIS — M4156 Other secondary scoliosis, lumbar region: Secondary | ICD-10-CM | POA: Diagnosis not present

## 2019-01-27 DIAGNOSIS — M5136 Other intervertebral disc degeneration, lumbar region: Secondary | ICD-10-CM | POA: Diagnosis not present

## 2019-01-27 DIAGNOSIS — M4156 Other secondary scoliosis, lumbar region: Secondary | ICD-10-CM | POA: Diagnosis not present

## 2019-01-27 DIAGNOSIS — L570 Actinic keratosis: Secondary | ICD-10-CM | POA: Diagnosis not present

## 2019-01-27 DIAGNOSIS — L821 Other seborrheic keratosis: Secondary | ICD-10-CM | POA: Diagnosis not present

## 2019-01-27 DIAGNOSIS — Z85828 Personal history of other malignant neoplasm of skin: Secondary | ICD-10-CM | POA: Diagnosis not present

## 2019-01-29 DIAGNOSIS — M5136 Other intervertebral disc degeneration, lumbar region: Secondary | ICD-10-CM | POA: Diagnosis not present

## 2019-01-29 DIAGNOSIS — M4156 Other secondary scoliosis, lumbar region: Secondary | ICD-10-CM | POA: Diagnosis not present

## 2019-01-30 DIAGNOSIS — Z79899 Other long term (current) drug therapy: Secondary | ICD-10-CM | POA: Diagnosis not present

## 2019-01-30 DIAGNOSIS — M25562 Pain in left knee: Secondary | ICD-10-CM | POA: Diagnosis not present

## 2019-01-30 DIAGNOSIS — M25561 Pain in right knee: Secondary | ICD-10-CM | POA: Diagnosis not present

## 2019-01-30 DIAGNOSIS — Z7901 Long term (current) use of anticoagulants: Secondary | ICD-10-CM | POA: Diagnosis not present

## 2019-01-30 DIAGNOSIS — Z96651 Presence of right artificial knee joint: Secondary | ICD-10-CM | POA: Diagnosis not present

## 2019-01-30 DIAGNOSIS — Z96652 Presence of left artificial knee joint: Secondary | ICD-10-CM | POA: Diagnosis not present

## 2019-01-30 DIAGNOSIS — G8929 Other chronic pain: Secondary | ICD-10-CM | POA: Diagnosis not present

## 2019-01-30 DIAGNOSIS — Z96653 Presence of artificial knee joint, bilateral: Secondary | ICD-10-CM | POA: Diagnosis not present

## 2019-01-30 DIAGNOSIS — G894 Chronic pain syndrome: Secondary | ICD-10-CM | POA: Diagnosis not present

## 2019-01-31 DIAGNOSIS — M5136 Other intervertebral disc degeneration, lumbar region: Secondary | ICD-10-CM | POA: Diagnosis not present

## 2019-01-31 DIAGNOSIS — M4156 Other secondary scoliosis, lumbar region: Secondary | ICD-10-CM | POA: Diagnosis not present

## 2019-02-03 DIAGNOSIS — M5136 Other intervertebral disc degeneration, lumbar region: Secondary | ICD-10-CM | POA: Diagnosis not present

## 2019-02-03 DIAGNOSIS — M4156 Other secondary scoliosis, lumbar region: Secondary | ICD-10-CM | POA: Diagnosis not present

## 2019-02-05 DIAGNOSIS — M5136 Other intervertebral disc degeneration, lumbar region: Secondary | ICD-10-CM | POA: Diagnosis not present

## 2019-02-05 DIAGNOSIS — M4156 Other secondary scoliosis, lumbar region: Secondary | ICD-10-CM | POA: Diagnosis not present

## 2019-02-06 DIAGNOSIS — M4156 Other secondary scoliosis, lumbar region: Secondary | ICD-10-CM | POA: Diagnosis not present

## 2019-02-10 DIAGNOSIS — M5136 Other intervertebral disc degeneration, lumbar region: Secondary | ICD-10-CM | POA: Diagnosis not present

## 2019-02-10 DIAGNOSIS — M4156 Other secondary scoliosis, lumbar region: Secondary | ICD-10-CM | POA: Diagnosis not present

## 2019-02-12 DIAGNOSIS — M4156 Other secondary scoliosis, lumbar region: Secondary | ICD-10-CM | POA: Diagnosis not present

## 2019-02-12 DIAGNOSIS — M5136 Other intervertebral disc degeneration, lumbar region: Secondary | ICD-10-CM | POA: Diagnosis not present

## 2019-02-12 DIAGNOSIS — R69 Illness, unspecified: Secondary | ICD-10-CM | POA: Diagnosis not present

## 2019-02-17 DIAGNOSIS — M4156 Other secondary scoliosis, lumbar region: Secondary | ICD-10-CM | POA: Diagnosis not present

## 2019-02-17 DIAGNOSIS — M5136 Other intervertebral disc degeneration, lumbar region: Secondary | ICD-10-CM | POA: Diagnosis not present

## 2019-02-19 DIAGNOSIS — G4733 Obstructive sleep apnea (adult) (pediatric): Secondary | ICD-10-CM | POA: Diagnosis not present

## 2019-02-21 DIAGNOSIS — M5136 Other intervertebral disc degeneration, lumbar region: Secondary | ICD-10-CM | POA: Diagnosis not present

## 2019-02-21 DIAGNOSIS — M4156 Other secondary scoliosis, lumbar region: Secondary | ICD-10-CM | POA: Diagnosis not present

## 2019-03-12 ENCOUNTER — Telehealth: Payer: Self-pay | Admitting: *Deleted

## 2019-03-12 NOTE — Telephone Encounter (Signed)
Informed Wife/patient of compliance results and verbalized understanding was indicated. Wife/Patient is aware and agreeable to AHI being within range at 2.4. Wife/Patient is aware and agreeable to improving compliance with machine usage.

## 2019-03-20 DIAGNOSIS — G4733 Obstructive sleep apnea (adult) (pediatric): Secondary | ICD-10-CM | POA: Diagnosis not present

## 2019-03-27 DIAGNOSIS — G4733 Obstructive sleep apnea (adult) (pediatric): Secondary | ICD-10-CM | POA: Diagnosis not present

## 2019-04-20 DIAGNOSIS — G4733 Obstructive sleep apnea (adult) (pediatric): Secondary | ICD-10-CM | POA: Diagnosis not present

## 2019-04-24 ENCOUNTER — Telehealth (INDEPENDENT_AMBULATORY_CARE_PROVIDER_SITE_OTHER): Payer: Self-pay | Admitting: Physical Medicine and Rehabilitation

## 2019-04-24 NOTE — Telephone Encounter (Signed)
?   What would the referral be for. And did he ever see Dr. Ellene Route. appt was for 11/2018 and I do not know if I ever saw a note.

## 2019-04-25 NOTE — Telephone Encounter (Signed)
The patient's wife states that the referral will be for chronic back pain. She states that he has had no relief in a year for his pain. She states that he did see Dr. Ellene Route and she would like for [place] to wait until we get the notes from that office before we refer him to Dr. Posey Pronto. I called their office to request that the notes be faxed.

## 2019-04-28 DIAGNOSIS — D225 Melanocytic nevi of trunk: Secondary | ICD-10-CM | POA: Diagnosis not present

## 2019-04-28 DIAGNOSIS — L570 Actinic keratosis: Secondary | ICD-10-CM | POA: Diagnosis not present

## 2019-04-28 DIAGNOSIS — L821 Other seborrheic keratosis: Secondary | ICD-10-CM | POA: Diagnosis not present

## 2019-04-28 DIAGNOSIS — Z85828 Personal history of other malignant neoplasm of skin: Secondary | ICD-10-CM | POA: Diagnosis not present

## 2019-05-14 DIAGNOSIS — M48061 Spinal stenosis, lumbar region without neurogenic claudication: Secondary | ICD-10-CM | POA: Diagnosis not present

## 2019-05-14 DIAGNOSIS — M47816 Spondylosis without myelopathy or radiculopathy, lumbar region: Secondary | ICD-10-CM | POA: Diagnosis not present

## 2019-05-14 DIAGNOSIS — M25541 Pain in joints of right hand: Secondary | ICD-10-CM | POA: Diagnosis not present

## 2019-05-14 DIAGNOSIS — M25542 Pain in joints of left hand: Secondary | ICD-10-CM | POA: Diagnosis not present

## 2019-05-20 DIAGNOSIS — G4733 Obstructive sleep apnea (adult) (pediatric): Secondary | ICD-10-CM | POA: Diagnosis not present

## 2019-05-28 ENCOUNTER — Ambulatory Visit: Payer: No Typology Code available for payment source | Admitting: Physical Medicine and Rehabilitation

## 2019-05-28 ENCOUNTER — Encounter

## 2019-06-10 DIAGNOSIS — M47816 Spondylosis without myelopathy or radiculopathy, lumbar region: Secondary | ICD-10-CM | POA: Diagnosis not present

## 2019-06-17 ENCOUNTER — Other Ambulatory Visit: Payer: Self-pay

## 2019-06-17 ENCOUNTER — Ambulatory Visit (INDEPENDENT_AMBULATORY_CARE_PROVIDER_SITE_OTHER): Payer: Medicare HMO | Admitting: Physical Medicine and Rehabilitation

## 2019-06-17 VITALS — BP 131/89 | HR 60

## 2019-06-17 DIAGNOSIS — G894 Chronic pain syndrome: Secondary | ICD-10-CM | POA: Diagnosis not present

## 2019-06-17 DIAGNOSIS — M419 Scoliosis, unspecified: Secondary | ICD-10-CM

## 2019-06-17 DIAGNOSIS — M47816 Spondylosis without myelopathy or radiculopathy, lumbar region: Secondary | ICD-10-CM | POA: Diagnosis not present

## 2019-06-17 DIAGNOSIS — M48061 Spinal stenosis, lumbar region without neurogenic claudication: Secondary | ICD-10-CM

## 2019-06-17 DIAGNOSIS — M5136 Other intervertebral disc degeneration, lumbar region: Secondary | ICD-10-CM | POA: Diagnosis not present

## 2019-06-17 NOTE — Progress Notes (Signed)
  Numeric Pain Rating Scale and Functional Assessment Average Pain 7 Pain Right Now 8 My pain is constant and aching Pain is worse with: standing and some activites Pain improves with: medication   In the last MONTH (on 0-10 scale) has pain interfered with the following?  1. General activity like being  able to carry out your everyday physical activities such as walking, climbing stairs, carrying groceries, or moving a chair?  Rating(10)  2. Relation with others like being able to carry out your usual social activities and roles such as  activities at home, at work and in your community. Rating(10)  3. Enjoyment of life such that you have  been bothered by emotional problems such as feeling anxious, depressed or irritable?  Rating(5)

## 2019-06-18 DIAGNOSIS — R69 Illness, unspecified: Secondary | ICD-10-CM | POA: Diagnosis not present

## 2019-07-08 DIAGNOSIS — M47816 Spondylosis without myelopathy or radiculopathy, lumbar region: Secondary | ICD-10-CM | POA: Diagnosis not present

## 2019-07-15 DIAGNOSIS — E7849 Other hyperlipidemia: Secondary | ICD-10-CM | POA: Diagnosis not present

## 2019-07-15 DIAGNOSIS — R7309 Other abnormal glucose: Secondary | ICD-10-CM | POA: Diagnosis not present

## 2019-07-15 DIAGNOSIS — R82998 Other abnormal findings in urine: Secondary | ICD-10-CM | POA: Diagnosis not present

## 2019-07-15 DIAGNOSIS — M859 Disorder of bone density and structure, unspecified: Secondary | ICD-10-CM | POA: Diagnosis not present

## 2019-07-15 DIAGNOSIS — Z125 Encounter for screening for malignant neoplasm of prostate: Secondary | ICD-10-CM | POA: Diagnosis not present

## 2019-07-22 DIAGNOSIS — Z0000 Encounter for general adult medical examination without abnormal findings: Secondary | ICD-10-CM | POA: Diagnosis not present

## 2019-07-22 DIAGNOSIS — M48061 Spinal stenosis, lumbar region without neurogenic claudication: Secondary | ICD-10-CM | POA: Diagnosis not present

## 2019-07-22 DIAGNOSIS — G43909 Migraine, unspecified, not intractable, without status migrainosus: Secondary | ICD-10-CM | POA: Diagnosis not present

## 2019-07-22 DIAGNOSIS — G894 Chronic pain syndrome: Secondary | ICD-10-CM | POA: Diagnosis not present

## 2019-07-22 DIAGNOSIS — M8550 Aneurysmal bone cyst, unspecified site: Secondary | ICD-10-CM | POA: Diagnosis not present

## 2019-07-22 DIAGNOSIS — R7309 Other abnormal glucose: Secondary | ICD-10-CM | POA: Diagnosis not present

## 2019-07-22 DIAGNOSIS — G4733 Obstructive sleep apnea (adult) (pediatric): Secondary | ICD-10-CM | POA: Diagnosis not present

## 2019-07-22 DIAGNOSIS — E785 Hyperlipidemia, unspecified: Secondary | ICD-10-CM | POA: Diagnosis not present

## 2019-07-22 DIAGNOSIS — I480 Paroxysmal atrial fibrillation: Secondary | ICD-10-CM | POA: Diagnosis not present

## 2019-07-22 DIAGNOSIS — N401 Enlarged prostate with lower urinary tract symptoms: Secondary | ICD-10-CM | POA: Diagnosis not present

## 2019-07-24 DIAGNOSIS — G4733 Obstructive sleep apnea (adult) (pediatric): Secondary | ICD-10-CM | POA: Diagnosis not present

## 2019-07-29 ENCOUNTER — Telehealth: Payer: Self-pay | Admitting: Cardiology

## 2019-07-29 DIAGNOSIS — Z85828 Personal history of other malignant neoplasm of skin: Secondary | ICD-10-CM | POA: Diagnosis not present

## 2019-07-29 DIAGNOSIS — D0462 Carcinoma in situ of skin of left upper limb, including shoulder: Secondary | ICD-10-CM | POA: Diagnosis not present

## 2019-07-29 DIAGNOSIS — L570 Actinic keratosis: Secondary | ICD-10-CM | POA: Diagnosis not present

## 2019-07-29 DIAGNOSIS — L821 Other seborrheic keratosis: Secondary | ICD-10-CM | POA: Diagnosis not present

## 2019-07-29 NOTE — Telephone Encounter (Signed)
  Wife is calling because her husband uses a Bipap machine and he has started  Snoring again in the last 3-4 months. They are wondering if he needs to be fitted again for a mask or if something is not working properly. Please advise.

## 2019-07-29 NOTE — Telephone Encounter (Signed)
Please get a download

## 2019-07-30 DIAGNOSIS — G894 Chronic pain syndrome: Secondary | ICD-10-CM | POA: Diagnosis not present

## 2019-07-30 DIAGNOSIS — M47816 Spondylosis without myelopathy or radiculopathy, lumbar region: Secondary | ICD-10-CM | POA: Diagnosis not present

## 2019-07-30 NOTE — Telephone Encounter (Signed)
Needs to improve compliance

## 2019-07-30 NOTE — Telephone Encounter (Signed)
[facility]:  Compliance Report Usage 06/29/2019 - 07/28/2019 Usage days 26/30 days (87%) >= 4 hours 19 days (63%) < 4 hours 7 days (23%) Usage hours 123 hours 47 minutes Average usage (total days) 4 hours 8 minutes Average usage (days used) 4 hours 46 minutes Median usage (days used) 4 hours 50 minutes Total used hours (value since last reset - 07/28/2019) 1,637 hours [employer] 10 VAuto Serial number [id number] Mode Spont IPAP 18 cmH2O EPAP 14 cmH2O Easy-Breathe On Therapy Leaks - L/min Median: 6.3 95th percentile: 53.5 Maximum: 86.8 Events per hour AI: 1.1 HI: 0.2 AHI: 1.3 Apnea Index Central: 0.7 Obstructive: 0.2 Unknown: 0.2

## 2019-08-01 NOTE — Telephone Encounter (Signed)
Called home phone lmtcb.

## 2019-08-04 ENCOUNTER — Encounter: Payer: Self-pay | Admitting: Physical Medicine and Rehabilitation

## 2019-08-04 NOTE — Progress Notes (Signed)
Alan Knight. - 73 y.o. male MRN [id number]  Date of birth: 1946-02-21  Office Visit Note: Visit Date: 06/17/2019 PCP: Reynold Bowen, MD Referred by: Reynold Bowen, MD  Subjective: Chief Complaint  Patient presents with   Lower Back - Pain   HPI: Alan Knight. is a 73 y.o. male who comes in today For evaluation management of chronic worsening severe low back pain.  Patient is present with his wife today.  There has been some off and on discussion about potential for patient to have some cognitive decline or difficulty.  I have known Mr. Alan Knight now for many years he does seem to be somewhat change in his affect and his approach to pain and activity.  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  He was maintained on Aleve for many years but now with some heart issues has had to discontinue that.  He began having just increasing back pain to the point where we completed epidural injections repeated MRI and repeated physical therapy.  We also ended up doing radiofrequency ablation.  He has a pretty degenerative spine with facet arthropathy and degenerative scoliosis as well as some levels of stenosis.  No high-grade nerve compression.  He did well with medial branch blocks but he reports that the ablation procedure was very painful.  Interestingly my radiology tech who is in the room with me for these procedures and myself do not remember any issues with the radiofrequency ablation at all during the procedure but if you ask him he says is 1 of the worst things he ever had.  The patient is also had ablation procedures performed of his knees.  This was done by Dr. Esmeralda Links at [facility].  I was told early on that he did not feel much relief with these but somehow these have been repeated so I guess he did get some relief.  His wife gives most of the history today.  His back pain is really no different.  If he is inactive and not doing  much he feels okay.  He reports constant aching pain.  Is worse with standing and some activities.  He is been unable to play any golf or swing the club.  He does report the tramadol twice a day is helping with the pain.  He has been on Lyrica and other medications with some relief.  They are telling me now that they are seeing Dr. Wilford Grist in [facility].  It sounds like the [facility] has assigned him to see Dr. Wilford Grist for evaluation of his pain condition.  The patient is trying to get more medical care through the [employer] [facility] [employer].  They are very pleased with this.  He has not had any falls or trauma or weakness since I last seen him.  No real new symptoms just 8 out of 10 back pain.  Review of Systems  Constitutional: Positive for malaise/fatigue. Negative for chills, fever and weight loss.  HENT: Negative for hearing loss and sinus pain.   Eyes: Negative for blurred vision, double vision and photophobia.  Respiratory: Negative for cough and shortness of breath.   Cardiovascular: Negative for chest pain, palpitations and leg swelling.  Gastrointestinal: Negative for abdominal pain, nausea and vomiting.  Genitourinary: Negative for flank pain.  Musculoskeletal: Positive for back pain and joint pain. Negative for myalgias.  Skin: Negative for itching and rash.  Neurological:  Negative for tremors, focal weakness and weakness.  Endo/Heme/Allergies: Negative.   Psychiatric/Behavioral: Negative for depression.  All other systems reviewed and are negative.  Otherwise per HPI.  Assessment & Plan: Visit Diagnoses:  1. Spinal stenosis of lumbar region without neurogenic claudication   2. Spondylosis without myelopathy or radiculopathy, lumbar region   3. Other intervertebral disc degeneration, lumbar region   4. Scoliosis of lumbar spine, unspecified scoliosis type   5. 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  This is also in the setting of newly treated heart issues.  He has had all manner of interventional care with some level of relief at times but nothing really substantial recently over the last year.  We spoke at great length today with the patient and his wife.  We spoke for more than 45 minutes on his condition and his current treatment plan.  I agree with them that Dr. Wilford Grist is a very good choice he has a very good reputation.  I have met him on a couple of occasions at the pain Society of the [place]'s meeting.  He probably does not remember me but we did make.  Nonetheless I think he would also be able to help the patient may be with neuropsychological evaluation as they have more ability in their facility for that than we would here.  Dr. Wilford Grist is also a neurologist so I think that would help his situation.  At this point they are transferring care to Dr. Wilford Grist which I fully agree with and hope he can really help.  Obviously we would see the patient back for any reason.    Meds & Orders: No orders of the defined types were placed in this encounter.  No orders of the defined types were placed in this encounter.   Follow-up: Return if symptoms worsen or fail to improve.   Procedures: No procedures performed  No notes on file   Clinical History: MRI LUMBAR SPINE WITHOUT CONTRAST  TECHNIQUE: Multiplanar, multisequence MR imaging of the lumbar spine was performed. No intravenous contrast was administered.  COMPARISON:  Lumbar MRI 05/31/2016.  FINDINGS: Segmentation: Conventional anatomy assumed, with the last open disc space designated L5-S1.This corresponds with previous  numbering.  Alignment: Stable. There is a moderate convex right scoliosis centered at L1-2. There is a slight retrolisthesis at L4-5 and a slight anterolisthesis at L5-S1.  Vertebrae: No worrisome osseous lesion, acute fracture or pars defect. There are scattered endplate degenerative changes throughout the lumbar spine. There is a stable small hemangioma at L4. The lumbar pedicles are somewhat short on a congenital basis. The visualized sacroiliac joints appear unremarkable.  Conus medullaris: Extends to the T12 level and appears normal.  Paraspinal and other soft tissues: No significant paraspinal findings.  Disc levels:  T10-11: Sagittal images demonstrate effacement of the CSF surrounding the cord due to annular disc bulging and bilateral facet hypertrophy. This level is not imaged in the axial plane. Appearance is similar to the previous examination.  T11-12: Sagittal images demonstrate mild disc bulging without significant spinal stenosis or nerve root encroachment.  T12-L1: Loss of disc height with annular disc bulging  and endplate osteophytes, asymmetric to the left, similar to previous study. There is moderate left-sided foraminal narrowing which appears unchanged.  L1-2: Loss of disc height with annular disc bulging and endplate osteophytes asymmetric to the left. There is stable asymmetric mild left lateral recess and moderate left foraminal narrowing.  L2-3: Loss of disc height with annular disc bulging. There is a new small disc protrusion in the right subarticular zone which narrows the right lateral recess and may contribute to right L3 nerve root encroachment. In addition, there is a broad-based extraforaminal disc protrusion on the left which may contribute to left L2 nerve root encroachment.  L3-4: Chronic loss of disc height with annular disc bulging and endplate osteophytes asymmetric to the right. Moderate facet and ligamentous hypertrophy  are also asymmetric to the right. There is resulting mild spinal stenosis and lateral recess narrowing bilaterally which appears unchanged. In addition, there is moderate to severe right foraminal narrowing which appears chronic.  L4-5: Chronic loss of disc height with annular disc bulging and endplate osteophytes asymmetric to the right. Moderate facet and ligamentous hypertrophy, worse on the right. These factors contribute to stable moderate multifactorial spinal stenosis, moderate to severe right and mild left foraminal narrowing.  L5-S1: Stable mild disc bulging and facet hypertrophy. Stable mild right-greater-than-left foraminal narrowing.  IMPRESSION: 1. Multilevel spondylosis associated with a convex right scoliosis. 2. At L2-3, there are new right subarticular zone and left extraforaminal disc protrusions which may contribute to right L3 and left L2 nerve root encroachment, respectively. 3. The findings at the additional levels are stable. There is mild multifactorial spinal stenosis and moderate to severe right foraminal narrowing at L3-4. At L4-5, there is moderate multifactorial spinal stenosis with moderate to severe right foraminal narrowing. 4. Chronic effacement of the CSF surrounding the cord at T10-11 without cord deformity, similar to previous study.   Electronically Signed   By: Richardean Sale M.D.   On: 10/28/2018 08:37   He reports that he has never smoked. He has never used smokeless tobacco. No results for input(s): HGBA1C, LABURIC in the last 8760 hours.  Objective:  VS:  HT:     WT:    BMI:      BP:131/89   HR:60bpm   TEMP: ( )   RESP:  Physical Exam Vitals signs and nursing note reviewed.  Constitutional:      General: He is not in acute distress.    Appearance: He is well-developed.  HENT:     Head: Normocephalic and atraumatic.     Nose: Nose normal.     Mouth/Throat:     Mouth: Mucous membranes are moist.     Pharynx: Oropharynx is  clear.  Eyes:     Conjunctiva/sclera: Conjunctivae normal.     Pupils: Pupils are equal, round, and reactive to light.  Neck:     Musculoskeletal: Normal range of motion and neck supple.     Trachea: No tracheal deviation.  Cardiovascular:     Rate and Rhythm: Normal rate and regular rhythm.     Pulses: Normal pulses.  Pulmonary:     Effort: Pulmonary effort is normal.     Breath sounds: Normal breath sounds.  Abdominal:     General: There is no distension.     Palpations: Abdomen is soft.     Tenderness: There is no guarding or rebound.  Musculoskeletal:        General: No deformity.     Right lower leg: No edema.  Left lower leg: No edema.     Comments: Patient is somewhat slow to rise from a seated position to full extension and he does have pain with extension of the lumbar spine.  He has no focal trigger points.  He has no pain with hip rotation he has good distal strength without clonus.  Skin:    General: Skin is warm and dry.     Findings: No erythema or rash.  Neurological:     General: No focal deficit present.     Mental Status: He is alert and oriented to person, place, and time.     Sensory: No sensory deficit.     Motor: No abnormal muscle tone.     Coordination: Coordination normal.     Gait: Gait abnormal.  Psychiatric:        Mood and Affect: Mood normal.        Behavior: Behavior normal.        Thought Content: Thought content normal.     Ortho Exam Imaging: No results found.  Past Medical/Family/Surgical/Social History: Medications & Allergies reviewed per EMR, new medications updated. Patient Active Problem List   Diagnosis Date Noted   OSA treated with BiPAP 11/13/2018   Chronic pain of both knees 10/03/2017   Chronic pain syndrome 10/03/2017   Status post left knee replacement 10/03/2017   Spondylosis without myelopathy or radiculopathy, lumbar region 05/17/2017   Chronic bilateral low back pain without sciatica 05/17/2017   Chest  tightness 02/21/2017   Cough 02/21/2017   Osteoarthritis 02/21/2017   Primary osteoarthritis of left knee 11/09/2015   S/P total knee replacement using cement 11/09/2015   Past Medical History:  Diagnosis Date   Arthritis    Basal cell carcinoma    BPH (benign prostatic hypertrophy) with urinary obstruction    Chest tightness 02/21/2017   Chronic bilateral low back pain without sciatica 05/17/2017   Chronic pain of both knees 10/03/2017   Chronic pain syndrome 10/03/2017   Cough 02/21/2017   Diverticulosis    Headache    Hyperlipidemia    Hypertension    Hypogonadism male    Internal hemorrhoids    OA (osteoarthritis)    OSA treated with BiPAP 11/13/2018   moderate obstructive sleep apnea with an AHI of 19.9/h with no central sleep apnea.  Oxygen desaturations were as low as 89%.  Now on BIPAP at 18/14cm H2O.   Osteoarthritis 02/21/2017   Paroxysmal atrial fibrillation (HCC)    Primary osteoarthritis of left knee 11/09/2015   S/P total knee replacement using cement 11/09/2015   Spondylosis without myelopathy or radiculopathy, lumbar region 05/17/2017   Status post left knee replacement 10/03/2017   Overview:  Added automatically from request for surgery [id number]   Family History  Problem Relation Age of Onset   Heart attack Father    CVA Brother    Past Surgical History:  Procedure Laterality Date   ATRIAL FIBRILLATION ABLATION N/A 11/16/2017   Procedure: ATRIAL FIBRILLATION ABLATION;  Surgeon: Thompson Grayer, MD;  Location: [facility];  Service: Cardiovascular;  Laterality: N/A;   CARPAL TUNNEL RELEASE     left   HIP ARTHROPLASTY     JOINT REPLACEMENT     KNEE ARTHROPLASTY     right   KNEE SURGERY     SHOULDER SURGERY     bilateral   TOTAL KNEE ARTHROPLASTY     left   TOTAL KNEE ARTHROPLASTY Left 11/09/2015   Procedure: TOTAL KNEE ARTHROPLASTY;  Surgeon:  Garald Balding, MD;  Location: [place];  Service: Orthopedics;   Laterality: Left;   Social History   Occupational History   Occupation: Sales  Tobacco Use   Smoking status: Never Smoker   Smokeless tobacco: Never Used  Substance and Sexual Activity   Alcohol use: Yes    Comment: 1 mixed drink per day   Drug use: No   Sexual activity: Not on file

## 2019-08-07 NOTE — Telephone Encounter (Signed)
Reached out to the patient and informed his wife per dpr that he should improve his compliance and not sleep on his back as that will make his apnea events worse. Verneda Skill verbalized understanding and thanked me for calling.

## 2019-08-19 DIAGNOSIS — M47816 Spondylosis without myelopathy or radiculopathy, lumbar region: Secondary | ICD-10-CM | POA: Diagnosis not present

## 2019-08-24 DIAGNOSIS — G4733 Obstructive sleep apnea (adult) (pediatric): Secondary | ICD-10-CM | POA: Diagnosis not present

## 2019-08-26 DIAGNOSIS — M47816 Spondylosis without myelopathy or radiculopathy, lumbar region: Secondary | ICD-10-CM | POA: Diagnosis not present

## 2019-09-06 DIAGNOSIS — Z23 Encounter for immunization: Secondary | ICD-10-CM | POA: Diagnosis not present

## 2019-09-19 DIAGNOSIS — M47816 Spondylosis without myelopathy or radiculopathy, lumbar region: Secondary | ICD-10-CM | POA: Diagnosis not present

## 2019-09-19 DIAGNOSIS — G894 Chronic pain syndrome: Secondary | ICD-10-CM | POA: Diagnosis not present

## 2019-09-19 DIAGNOSIS — M48061 Spinal stenosis, lumbar region without neurogenic claudication: Secondary | ICD-10-CM | POA: Diagnosis not present

## 2019-09-24 DIAGNOSIS — G4733 Obstructive sleep apnea (adult) (pediatric): Secondary | ICD-10-CM | POA: Diagnosis not present

## 2019-10-29 DIAGNOSIS — C44622 Squamous cell carcinoma of skin of right upper limb, including shoulder: Secondary | ICD-10-CM | POA: Diagnosis not present

## 2019-10-29 DIAGNOSIS — D485 Neoplasm of uncertain behavior of skin: Secondary | ICD-10-CM | POA: Diagnosis not present

## 2019-10-29 DIAGNOSIS — Z85828 Personal history of other malignant neoplasm of skin: Secondary | ICD-10-CM | POA: Diagnosis not present

## 2019-10-29 DIAGNOSIS — L570 Actinic keratosis: Secondary | ICD-10-CM | POA: Diagnosis not present

## 2019-10-29 DIAGNOSIS — C44629 Squamous cell carcinoma of skin of left upper limb, including shoulder: Secondary | ICD-10-CM | POA: Diagnosis not present

## 2019-10-29 DIAGNOSIS — L821 Other seborrheic keratosis: Secondary | ICD-10-CM | POA: Diagnosis not present

## 2019-10-29 DIAGNOSIS — C44529 Squamous cell carcinoma of skin of other part of trunk: Secondary | ICD-10-CM | POA: Diagnosis not present

## 2019-10-29 DIAGNOSIS — D225 Melanocytic nevi of trunk: Secondary | ICD-10-CM | POA: Diagnosis not present

## 2019-10-29 DIAGNOSIS — C44722 Squamous cell carcinoma of skin of right lower limb, including hip: Secondary | ICD-10-CM | POA: Diagnosis not present

## 2019-10-29 DIAGNOSIS — C44621 Squamous cell carcinoma of skin of unspecified upper limb, including shoulder: Secondary | ICD-10-CM | POA: Diagnosis not present

## 2019-11-05 DIAGNOSIS — R69 Illness, unspecified: Secondary | ICD-10-CM | POA: Diagnosis not present

## 2019-11-12 ENCOUNTER — Telehealth: Payer: Self-pay | Admitting: *Deleted

## 2019-11-12 NOTE — Telephone Encounter (Signed)

## 2019-11-12 NOTE — Progress Notes (Addendum)
Virtual Visit via Telephone Note   This visit type was conducted due to national recommendations for restrictions regarding the COVID-19 Pandemic (e.g. social distancing) in an effort to limit this patient's exposure and mitigate transmission in our community.  Due to his co-morbid illnesses, this patient is at least at moderate risk for complications without adequate follow up.  This format is felt to be most appropriate for this patient at this time.  All issues noted in this document were discussed and addressed.  A limited physical exam was performed with this format.  Please refer to the patient's chart for his consent to telehealth for [facility].   Evaluation Performed:  Follow-up visit  This visit type was conducted due to national recommendations for restrictions regarding the COVID-19 Pandemic (e.g. social distancing).  This format is felt to be most appropriate for this patient at this time.  All issues noted in this document were discussed and addressed.  No physical exam was performed (except for noted visual exam findings with Video Visits).  Please refer to the patient's chart (MyChart message for video visits and phone note for telephone visits) for the patient's consent to telehealth for [facility].  Date:  11/13/2019   ID:  Alan Route., DOB [birth year], MRN [id number]  Patient Location:  Home  Provider location:   [place]  PCP:  Reynold Bowen, MD  Cardiologist:  Thompson Grayer, MD Sleep Medicine:  Fransico Him, MD Electrophysiologist:  Thompson Grayer, MD   Chief Complaint:  OSA  History of Present Illness:    Alan Oiler. is a 73 y.o. male who presents via audio/video conferencing for a telehealth visit today.    Alan Caballeros. is a 73 y.o. male with a hx of hypertension, hyperlipidemia and paroxysmal atrial fibrillation.  He was referred for sleep study due to fatigue and snoring.  He was found to have moderate obstructive sleep apnea with an  AHI of 19.9/h with no central sleep apnea.  Oxygen desaturations were as low as 89%.  He underwent CPAP titration but could not be adequately titrated to doing ongoing respiratory events.  He underwent BiPAP titration to 18/14cm H2O.  He is doing well with his CPAP device and thinks that he has gotten used to it.  He tolerates the mask and feels the pressure is adequate.  Since going on CPAP he feels rested in the am and has no significant daytime sleepiness.  He denies any significant mouth or nasal dryness or nasal congestion.  He does not think that he snores.    The patient does not have symptoms concerning for COVID-19 infection (fever, chills, cough, or new shortness of breath).   Prior CV studies:   The following studies were reviewed today:  PAP compliance download  Past Medical History:  Diagnosis Date  . Arthritis   . Basal cell carcinoma   . BPH (benign prostatic hypertrophy) with urinary obstruction   . Chest tightness 02/21/2017  . Chronic bilateral low back pain without sciatica 05/17/2017  . Chronic pain of both knees 10/03/2017  . Chronic pain syndrome 10/03/2017  . Cough 02/21/2017  . Diverticulosis   . Headache   . Hyperlipidemia   . Hypertension   . Hypogonadism male   . Internal hemorrhoids   . OA (osteoarthritis)   . OSA treated with BiPAP 11/13/2018   moderate obstructive sleep apnea with an AHI of 19.9/h with no central sleep apnea.  Oxygen desaturations were as low as  89%.  Now on BIPAP at 18/14cm H2O.  . Osteoarthritis 02/21/2017  . Paroxysmal atrial fibrillation (HCC)   . Primary osteoarthritis of left knee 11/09/2015  . S/P total knee replacement using cement 11/09/2015  . Spondylosis without myelopathy or radiculopathy, lumbar region 05/17/2017  . Status post left knee replacement 10/03/2017   Overview:  Added automatically from request for surgery [id number]   Past Surgical History:  Procedure Laterality Date  . ATRIAL FIBRILLATION ABLATION N/A 11/16/2017    Procedure: ATRIAL FIBRILLATION ABLATION;  Surgeon: Thompson Grayer, MD;  Location: [place] [facility];  Service: Cardiovascular;  Laterality: N/A;  . CARPAL TUNNEL RELEASE     left  . HIP ARTHROPLASTY    . JOINT REPLACEMENT    . KNEE ARTHROPLASTY     right  . KNEE SURGERY    . SHOULDER SURGERY     bilateral  . TOTAL KNEE ARTHROPLASTY     left  . TOTAL KNEE ARTHROPLASTY Left 11/09/2015   Procedure: TOTAL KNEE ARTHROPLASTY;  Surgeon: Garald Balding, MD;  Location: [place];  Service: Orthopedics;  Laterality: Left;     Current Meds  Medication Sig  . acetaminophen (TYLENOL) 325 MG tablet Take 650 mg every 6 (six) hours as needed by mouth for moderate pain or headache.  . Ascorbic Acid (VITAMIN C) 1000 MG tablet Take 1,000 mg by mouth daily.  . calcium acetate (PHOSLO) 667 MG capsule Take 500 mg by mouth daily.  . Cholecalciferol (VITAMIN D3) 5000 UNITS CAPS Take 5,000 Units daily by mouth.   . co-enzyme Q-10 30 MG capsule Take 300 mg by mouth daily.  . DULoxetine (CYMBALTA) 60 MG capsule Take 1 capsule (60 mg total) by mouth daily.  Marland Kitchen ELIQUIS 5 MG TABS tablet TAKE 1 TABLET BY MOUTH TWICE DAILY  . ezetimibe (ZETIA) 10 MG tablet Take 10 mg by mouth daily.  Marland Kitchen gabapentin (NEURONTIN) 300 MG capsule Take 1 capsule by mouth daily.  Marland Kitchen glucosamine-chondroitin 500-400 MG tablet Take 1 tablet by mouth daily.  Marland Kitchen lisinopril (PRINIVIL,ZESTRIL) 5 MG tablet Take 10 mg by mouth daily.   . Multiple Minerals-Vitamins (CAL-MAG-ZINC-D) TABS Take 1 tablet daily by mouth.  . naproxen (NAPROSYN) 500 MG tablet Take 500 mg by mouth 2 (two) times a week.  . Omega-3 1000 MG CAPS Take 1 capsule by mouth daily.  . pregabalin (LYRICA) 75 MG capsule Take 75 mg by mouth 2 (two) times daily.  . simvastatin (ZOCOR) 40 MG tablet Take 40 mg by mouth daily.  . traMADol (ULTRAM) 50 MG tablet TAKE 1 TABLET BY MOUTH EVERY 12 HOURS AS NEEDED  . zonisamide (ZONEGRAN) 50 MG capsule Take 50 mg by mouth daily.     Allergies:    Patient has no known allergies.   Social History   Tobacco Use  . Smoking status: Never Smoker  . Smokeless tobacco: Never Used  Substance Use Topics  . Alcohol use: Yes    Comment: 1 mixed drink per day  . Drug use: No     Family Hx: The patient's family history includes CVA in his brother; Heart attack in his father.  ROS:   Please see the history of present illness.     All other systems reviewed and are negative.   Labs/Other Tests and Data Reviewed:    Recent Labs: No results found for requested labs within last 8760 hours.   Recent Lipid Panel No results found for: CHOL, TRIG, HDL, CHOLHDL, LDLCALC, LDLDIRECT  Wt Readings from  Last 3 Encounters:  11/13/19 161 lb (73 kg)  01/06/19 160 lb (72.6 kg)  11/18/18 160 lb 6.4 oz (72.8 kg)     Objective:    Vital Signs:  BP 132/72   Pulse 69   Ht 5\' 10"  (1.778 m)   Wt 161 lb (73 kg)   BMI 23.10 kg/m     ASSESSMENT & PLAN:    1.  OSA -The patient is tolerating PAP therapy well without any problems. The PAP download was reviewed today and showed an AHI of 3.3/hr on 18/14 cm H2O with 77% compliance in using more than 4 hours nightly.  The patient has been using and benefiting from PAP use and will continue to benefit from therapy. His wife says that he snores when he is sleeping supine.  I encouraged her to remind him to try to sleep on his side.   2. HTN -BP controlled on exam -continue Lisinopril 10mg  daily -creatinine is stable at 0.8 in July 2020  3.  PAF -he has not had any palpitations -he denies any bleeding issues with the DOAC -continue Eliquis 5mg  BID  COVID-19 Education: The signs and symptoms of COVID-19 were discussed with the patient and how to seek care for testing (follow up with PCP or arrange E-visit).  The importance of social distancing was discussed today.  Patient Risk:   After full review of this patient's clinical status, I feel that they are at least moderate risk at this  time.  Time:   Today, I have spent 20 minutes directly with the patient on telemedicine discussing medical problems including OSA, HTN and PAF.  We also reviewed the symptoms of COVID 19 and the ways to protect against contracting the virus with telehealth technology.  I spent an additional 5 minutes reviewing patient's chart including PAP compliance download.  Medication Adjustments/Labs and Tests Ordered: Current medicines are reviewed at length with the patient today.  Concerns regarding medicines are outlined above.  Tests Ordered: No orders of the defined types were placed in this encounter.  Medication Changes: No orders of the defined types were placed in this encounter.   Disposition:  Follow up in 1 year(s)  Signed, Fransico Him, MD  11/13/2019 11:16 AM    [place]

## 2019-11-13 ENCOUNTER — Other Ambulatory Visit: Payer: Self-pay

## 2019-11-13 ENCOUNTER — Encounter: Payer: Self-pay | Admitting: Cardiology

## 2019-11-13 ENCOUNTER — Telehealth (INDEPENDENT_AMBULATORY_CARE_PROVIDER_SITE_OTHER): Payer: Medicare HMO | Admitting: Cardiology

## 2019-11-13 VITALS — BP 132/72 | HR 69 | Ht 70.0 in | Wt 161.0 lb

## 2019-11-13 DIAGNOSIS — G4733 Obstructive sleep apnea (adult) (pediatric): Secondary | ICD-10-CM | POA: Diagnosis not present

## 2019-11-13 DIAGNOSIS — I480 Paroxysmal atrial fibrillation: Secondary | ICD-10-CM

## 2019-11-13 DIAGNOSIS — I10 Essential (primary) hypertension: Secondary | ICD-10-CM

## 2019-12-08 DIAGNOSIS — G894 Chronic pain syndrome: Secondary | ICD-10-CM | POA: Diagnosis not present

## 2019-12-08 DIAGNOSIS — M5136 Other intervertebral disc degeneration, lumbar region: Secondary | ICD-10-CM | POA: Diagnosis not present

## 2019-12-08 DIAGNOSIS — M47816 Spondylosis without myelopathy or radiculopathy, lumbar region: Secondary | ICD-10-CM | POA: Diagnosis not present

## 2019-12-08 DIAGNOSIS — M792 Neuralgia and neuritis, unspecified: Secondary | ICD-10-CM | POA: Diagnosis not present

## 2019-12-10 DIAGNOSIS — G894 Chronic pain syndrome: Secondary | ICD-10-CM | POA: Diagnosis not present

## 2019-12-10 DIAGNOSIS — M25561 Pain in right knee: Secondary | ICD-10-CM | POA: Diagnosis not present

## 2019-12-10 DIAGNOSIS — Z96651 Presence of right artificial knee joint: Secondary | ICD-10-CM | POA: Diagnosis not present

## 2019-12-10 DIAGNOSIS — M25562 Pain in left knee: Secondary | ICD-10-CM | POA: Diagnosis not present

## 2019-12-10 DIAGNOSIS — Z96652 Presence of left artificial knee joint: Secondary | ICD-10-CM | POA: Diagnosis not present

## 2019-12-10 DIAGNOSIS — Z96653 Presence of artificial knee joint, bilateral: Secondary | ICD-10-CM | POA: Diagnosis not present

## 2019-12-10 DIAGNOSIS — M549 Dorsalgia, unspecified: Secondary | ICD-10-CM | POA: Diagnosis not present

## 2019-12-10 DIAGNOSIS — G8929 Other chronic pain: Secondary | ICD-10-CM | POA: Diagnosis not present

## 2020-01-08 DIAGNOSIS — Z96641 Presence of right artificial hip joint: Secondary | ICD-10-CM | POA: Diagnosis not present

## 2020-01-08 DIAGNOSIS — Z79899 Other long term (current) drug therapy: Secondary | ICD-10-CM | POA: Diagnosis not present

## 2020-01-08 DIAGNOSIS — M1990 Unspecified osteoarthritis, unspecified site: Secondary | ICD-10-CM | POA: Diagnosis not present

## 2020-01-08 DIAGNOSIS — Z8249 Family history of ischemic heart disease and other diseases of the circulatory system: Secondary | ICD-10-CM | POA: Diagnosis not present

## 2020-01-08 DIAGNOSIS — M25562 Pain in left knee: Secondary | ICD-10-CM | POA: Diagnosis not present

## 2020-01-08 DIAGNOSIS — Z96653 Presence of artificial knee joint, bilateral: Secondary | ICD-10-CM | POA: Diagnosis not present

## 2020-01-08 DIAGNOSIS — G894 Chronic pain syndrome: Secondary | ICD-10-CM | POA: Diagnosis not present

## 2020-01-08 DIAGNOSIS — Z833 Family history of diabetes mellitus: Secondary | ICD-10-CM | POA: Diagnosis not present

## 2020-01-08 DIAGNOSIS — Z7901 Long term (current) use of anticoagulants: Secondary | ICD-10-CM | POA: Diagnosis not present

## 2020-01-08 DIAGNOSIS — M25561 Pain in right knee: Secondary | ICD-10-CM | POA: Diagnosis not present

## 2020-01-12 DIAGNOSIS — G894 Chronic pain syndrome: Secondary | ICD-10-CM | POA: Diagnosis not present

## 2020-01-12 DIAGNOSIS — G8929 Other chronic pain: Secondary | ICD-10-CM | POA: Diagnosis not present

## 2020-01-12 DIAGNOSIS — Z96653 Presence of artificial knee joint, bilateral: Secondary | ICD-10-CM | POA: Diagnosis not present

## 2020-01-12 DIAGNOSIS — Z96651 Presence of right artificial knee joint: Secondary | ICD-10-CM | POA: Diagnosis not present

## 2020-01-12 DIAGNOSIS — M25562 Pain in left knee: Secondary | ICD-10-CM | POA: Diagnosis not present

## 2020-01-12 DIAGNOSIS — Z96641 Presence of right artificial hip joint: Secondary | ICD-10-CM | POA: Diagnosis not present

## 2020-01-12 DIAGNOSIS — M25561 Pain in right knee: Secondary | ICD-10-CM | POA: Diagnosis not present

## 2020-01-15 DIAGNOSIS — Z7901 Long term (current) use of anticoagulants: Secondary | ICD-10-CM | POA: Diagnosis not present

## 2020-01-15 DIAGNOSIS — Z881 Allergy status to other antibiotic agents status: Secondary | ICD-10-CM | POA: Diagnosis not present

## 2020-01-15 DIAGNOSIS — Z8249 Family history of ischemic heart disease and other diseases of the circulatory system: Secondary | ICD-10-CM | POA: Diagnosis not present

## 2020-01-15 DIAGNOSIS — Z96651 Presence of right artificial knee joint: Secondary | ICD-10-CM | POA: Diagnosis not present

## 2020-01-15 DIAGNOSIS — G894 Chronic pain syndrome: Secondary | ICD-10-CM | POA: Diagnosis not present

## 2020-01-15 DIAGNOSIS — M1711 Unilateral primary osteoarthritis, right knee: Secondary | ICD-10-CM | POA: Diagnosis not present

## 2020-01-15 DIAGNOSIS — Z833 Family history of diabetes mellitus: Secondary | ICD-10-CM | POA: Diagnosis not present

## 2020-01-15 DIAGNOSIS — Z79899 Other long term (current) drug therapy: Secondary | ICD-10-CM | POA: Diagnosis not present

## 2020-01-15 DIAGNOSIS — Z96641 Presence of right artificial hip joint: Secondary | ICD-10-CM | POA: Diagnosis not present

## 2020-01-15 DIAGNOSIS — Z823 Family history of stroke: Secondary | ICD-10-CM | POA: Diagnosis not present

## 2020-01-15 DIAGNOSIS — Z96653 Presence of artificial knee joint, bilateral: Secondary | ICD-10-CM | POA: Diagnosis not present

## 2020-01-15 DIAGNOSIS — M25561 Pain in right knee: Secondary | ICD-10-CM | POA: Diagnosis not present

## 2020-01-17 ENCOUNTER — Ambulatory Visit: Payer: Medicare HMO

## 2020-01-20 DIAGNOSIS — R7309 Other abnormal glucose: Secondary | ICD-10-CM | POA: Diagnosis not present

## 2020-01-20 DIAGNOSIS — R413 Other amnesia: Secondary | ICD-10-CM | POA: Diagnosis not present

## 2020-01-20 DIAGNOSIS — M48061 Spinal stenosis, lumbar region without neurogenic claudication: Secondary | ICD-10-CM | POA: Diagnosis not present

## 2020-01-20 DIAGNOSIS — M8580 Other specified disorders of bone density and structure, unspecified site: Secondary | ICD-10-CM | POA: Diagnosis not present

## 2020-01-20 DIAGNOSIS — G894 Chronic pain syndrome: Secondary | ICD-10-CM | POA: Diagnosis not present

## 2020-01-20 DIAGNOSIS — E785 Hyperlipidemia, unspecified: Secondary | ICD-10-CM | POA: Diagnosis not present

## 2020-01-20 DIAGNOSIS — I480 Paroxysmal atrial fibrillation: Secondary | ICD-10-CM | POA: Diagnosis not present

## 2020-01-20 DIAGNOSIS — Z7901 Long term (current) use of anticoagulants: Secondary | ICD-10-CM | POA: Diagnosis not present

## 2020-01-20 DIAGNOSIS — I2510 Atherosclerotic heart disease of native coronary artery without angina pectoris: Secondary | ICD-10-CM | POA: Diagnosis not present

## 2020-01-20 DIAGNOSIS — G4733 Obstructive sleep apnea (adult) (pediatric): Secondary | ICD-10-CM | POA: Diagnosis not present

## 2020-01-28 DIAGNOSIS — H25813 Combined forms of age-related cataract, bilateral: Secondary | ICD-10-CM | POA: Diagnosis not present

## 2020-01-28 DIAGNOSIS — H5203 Hypermetropia, bilateral: Secondary | ICD-10-CM | POA: Diagnosis not present

## 2020-01-28 DIAGNOSIS — H40033 Anatomical narrow angle, bilateral: Secondary | ICD-10-CM | POA: Diagnosis not present

## 2020-01-28 DIAGNOSIS — H52222 Regular astigmatism, left eye: Secondary | ICD-10-CM | POA: Diagnosis not present

## 2020-01-28 DIAGNOSIS — H524 Presbyopia: Secondary | ICD-10-CM | POA: Diagnosis not present

## 2020-02-02 ENCOUNTER — Telehealth: Payer: Self-pay | Admitting: Neurology

## 2020-02-02 DIAGNOSIS — Z85828 Personal history of other malignant neoplasm of skin: Secondary | ICD-10-CM | POA: Diagnosis not present

## 2020-02-02 DIAGNOSIS — M7918 Myalgia, other site: Secondary | ICD-10-CM | POA: Diagnosis not present

## 2020-02-02 DIAGNOSIS — L570 Actinic keratosis: Secondary | ICD-10-CM | POA: Diagnosis not present

## 2020-02-02 DIAGNOSIS — D225 Melanocytic nevi of trunk: Secondary | ICD-10-CM | POA: Diagnosis not present

## 2020-02-02 DIAGNOSIS — G894 Chronic pain syndrome: Secondary | ICD-10-CM | POA: Diagnosis not present

## 2020-02-02 DIAGNOSIS — M5136 Other intervertebral disc degeneration, lumbar region: Secondary | ICD-10-CM | POA: Diagnosis not present

## 2020-02-02 DIAGNOSIS — M47816 Spondylosis without myelopathy or radiculopathy, lumbar region: Secondary | ICD-10-CM | POA: Diagnosis not present

## 2020-02-02 DIAGNOSIS — L821 Other seborrheic keratosis: Secondary | ICD-10-CM | POA: Diagnosis not present

## 2020-02-02 NOTE — Telephone Encounter (Signed)
Alan Knight, patient hs an appointment this week can you please reschedule to [name] please? It can be video instead of office. Alternatively if patient is doing well we can just reschedule and have them come into the office at the end of the summer of beginning of fall when it may be safer ie Covid. thanks

## 2020-02-02 NOTE — Telephone Encounter (Signed)
I called patient and got them rescheduled.

## 2020-02-05 ENCOUNTER — Ambulatory Visit: Payer: No Typology Code available for payment source | Admitting: Neurology

## 2020-03-05 DIAGNOSIS — R69 Illness, unspecified: Secondary | ICD-10-CM | POA: Diagnosis not present

## 2020-03-08 NOTE — Progress Notes (Signed)
[id number]    Provider:  Dr Jaynee Eagles Requesting Provider: Reynold Bowen, MD Primary Care Provider:  Reynold Bowen, MD  CC:  Memory loss  HPI:  Alan Knight. is a 74 y.o. male here as requested by Reynold Bowen, MD for memory impairment. PMHx obstructive sleep apnea treated with BiPAP, prediabetes, arthritis and chronic low back pain, paroxysmal A. fib status post ablation and off antiarrhythmic therapy, hypertension, hyperlipidemia, headache, chronic pain due to osteoarthritis.  I reviewed Dr. Lorain Childes notes: Patient does not report significant alcohol use, monthly or less, he has never smoked, he exercises 2 times a week.  He has good CPAP compliance.  His headaches are well managed, no current meds, no falls no assistive devices, no A. fib episodes, he does report some memory issues which have been persistent.  He reports some mild worsening, he had hallucinations with Valtrex which he took for shingles, he was sent to neurology to evaluate these changes.  They noticed slight memory changes for about 5-7 years since retiring, worsening recently in the setting os pain. Noticed some short-term memory loss in the beginning.  He has been athletic all his life, within the last year his back "went crazy" he has been taking tramadol. About 1.5 years ago he went into chronic pain. The pain is affecting his memory. Here with his wife who also provides much information. He has also been on tramadol 2-3x a day.Memory worsening this past 1.5 years. Patient thinks he is having more memory changes. He is having more problems multi-tasking, he can't complete a list of items wife asks him to do. He is repeating himself and asking the same questions. Wife pays bills but not because patient made any mistakes. But wife doesn't think he could be reliable to take care of the bills. He also has some obsessive-compulsive traits. Successful with medications himself but wife says he can get  confused if there is a change of medication and she has to check up on him. No problems driving but she has started driving more. Mother had dementia. He denies concussions.   Reviewed notes, labs and imaging from outside physicians, which showed:  I reviewed MRI of the brain 03/31/2015 images and agree with the following: 1. No acute intracranial process identified. 2. Generalized cerebral atrophy with mild patchy T2/FLAIR hyperintensities within the periventricular and deep white matter of both cerebral hemispheres. While these foci are nonspecific, these are most likely related to underlying chronic small vessel ischemic changes, mild for patient age.  Review of Systems: Patient complains of symptoms per HPI as well as the following symptoms: memory loss. Pertinent negatives and positives per HPI. All others negative.   Social History   Socioeconomic History  . Marital status: Married    Spouse name: Not on file  . Number of children: Not on file  . Years of education: Not on file  . Highest education level: High school graduate  Occupational History  . Occupation: Sales  Tobacco Use  . Smoking status: Never Smoker  . Smokeless tobacco: Never Used  Substance and Sexual Activity  . Alcohol use: Not Currently    Comment: 1 mixed drink per day  . Drug use: No  . Sexual activity: Not on file  Other Topics Concern  . Not on file  Social History Narrative   Lives in [place] with wife   Retired   Right handed   Caffeine: maybe 2 cups a month   Social Determinants of Health  Financial Resource Strain:   . Difficulty of Paying Living Expenses:   Food Insecurity:   . Worried About Charity fundraiser in the Last Year:   . Arboriculturist in the Last Year:   Transportation Needs:   . Film/video editor (Medical):   Marland Kitchen Lack of Transportation (Non-Medical):   Physical Activity:   . Days of Exercise per Week:   . Minutes of Exercise per Session:   Stress:   . Feeling  of Stress :   Social Connections:   . Frequency of Communication with Friends and Family:   . Frequency of Social Gatherings with Friends and Family:   . Attends Religious Services:   . Active Member of Clubs or Organizations:   . Attends Archivist Meetings:   Marland Kitchen Marital Status:   Intimate Partner Violence:   . Fear of Current or Ex-Partner:   . Emotionally Abused:   Marland Kitchen Physically Abused:   . Sexually Abused:     Family History  Problem Relation Age of Onset  . Heart attack Father   . CVA Brother   . Memory loss Mother        was in memory care for 15+ years    Past Medical History:  Diagnosis Date  . Arthritis   . Basal cell carcinoma   . BPH (benign prostatic hypertrophy) with urinary obstruction   . Chest tightness 02/21/2017  . Chronic bilateral low back pain without sciatica 05/17/2017  . Chronic pain of both knees 10/03/2017  . Chronic pain syndrome 10/03/2017  . Cough 02/21/2017  . Diverticulosis   . GERD (gastroesophageal reflux disease)   . Headache   . Hyperlipidemia   . Hypertension   . Hypogonadism male   . Internal hemorrhoids   . Migraine   . OA (osteoarthritis)   . OSA treated with BiPAP 11/13/2018   moderate obstructive sleep apnea with an AHI of 19.9/h with no central sleep apnea.  Oxygen desaturations were as low as 89%.  Now on BIPAP at 18/14cm H2O.  . Osteoarthritis 02/21/2017  . Paroxysmal atrial fibrillation (HCC)   . Primary osteoarthritis of left knee 11/09/2015  . S/P total knee replacement using cement 11/09/2015  . Spondylosis without myelopathy or radiculopathy, lumbar region 05/17/2017  . Status post left knee replacement 10/03/2017   Overview:  Added automatically from request for surgery [id number]    Patient Active Problem List   Diagnosis Date Noted  . OSA treated with BiPAP 11/13/2018  . Chronic pain of both knees 10/03/2017  . Chronic pain syndrome 10/03/2017  . Status post left knee replacement 10/03/2017  . Spondylosis  without myelopathy or radiculopathy, lumbar region 05/17/2017  . Chronic bilateral low back pain without sciatica 05/17/2017  . Chest tightness 02/21/2017  . Cough 02/21/2017  . Osteoarthritis 02/21/2017  . Primary osteoarthritis of left knee 11/09/2015  . S/P total knee replacement using cement 11/09/2015    Past Surgical History:  Procedure Laterality Date  . ATRIAL FIBRILLATION ABLATION N/A 11/16/2017   Procedure: ATRIAL FIBRILLATION ABLATION;  Surgeon: Thompson Grayer, MD;  Location: [facility];  Service: Cardiovascular;  Laterality: N/A;  . CARPAL TUNNEL RELEASE     left  . genicular nerve ablation    . HIP ARTHROPLASTY    . JOINT REPLACEMENT    . KNEE ARTHROPLASTY     right  . KNEE SURGERY    . lumbar nerve ablation    . SHOULDER SURGERY  bilateral  . TOTAL KNEE ARTHROPLASTY     left  . TOTAL KNEE ARTHROPLASTY Left 11/09/2015   Procedure: TOTAL KNEE ARTHROPLASTY;  Surgeon: Garald Balding, MD;  Location: [facility];  Service: Orthopedics;  Laterality: Left;    Current Outpatient Medications  Medication Sig Dispense Refill  . acetaminophen (TYLENOL) 325 MG tablet Take 650 mg every 6 (six) hours as needed by mouth for moderate pain or headache.    . Ascorbic Acid (VITAMIN C) 1000 MG tablet Take 1,000 mg by mouth daily.    . calcium acetate (PHOSLO) 667 MG capsule Take 500 mg by mouth daily.    . Cholecalciferol (VITAMIN D3) 5000 UNITS CAPS Take 5,000 Units daily by mouth.     . co-enzyme Q-10 30 MG capsule Take 300 mg by mouth daily.    . DULoxetine (CYMBALTA) 60 MG capsule Take 1 capsule (60 mg total) by mouth daily. 30 capsule 3  . ELIQUIS 5 MG TABS tablet TAKE 1 TABLET BY MOUTH TWICE DAILY 180 tablet 1  . ezetimibe (ZETIA) 10 MG tablet Take 10 mg by mouth daily.    Marland Kitchen gabapentin (NEURONTIN) 300 MG capsule Take 1 capsule by mouth daily.    Marland Kitchen glucosamine-chondroitin 500-400 MG tablet Take 1 tablet by mouth daily.    Marland Kitchen lisinopril (PRINIVIL,ZESTRIL) 5 MG tablet Take  10 mg by mouth daily.     . Multiple Minerals-Vitamins (CAL-MAG-ZINC-D) TABS Take 1 tablet daily by mouth.    . Omega-3 1000 MG CAPS Take 1 capsule by mouth daily.    . pregabalin (LYRICA) 75 MG capsule Take 75 mg by mouth 2 (two) times daily.    . simvastatin (ZOCOR) 40 MG tablet Take 40 mg by mouth daily.    . traMADol (ULTRAM) 50 MG tablet TAKE 1 TABLET BY MOUTH EVERY 12 HOURS AS NEEDED 30 tablet 0   No current facility-administered medications for this visit.    Allergies as of 03/09/2020  . (No Known Allergies)    Vitals: BP 112/70 (BP Location: Right Arm, Patient Position: Sitting)   Pulse 66   Temp (!) 97 F (36.1 C)   Ht 5\' 10"  (1.778 m)   Wt 162 lb (73.5 kg)   BMI 23.24 kg/m  Last Weight:  Wt Readings from Last 1 Encounters:  03/09/20 162 lb (73.5 kg)   Last Height:   Ht Readings from Last 1 Encounters:  03/09/20 5\' 10"  (1.778 m)     Physical exam: Exam: Gen: NAD, conversant, well nourised, well groomed                     CV: RRR, no MRG. No Carotid Bruits. No peripheral edema, warm, nontender Eyes: Conjunctivae clear without exudates or hemorrhage  Neuro: Detailed Neurologic Exam  Speech:    Speech is normal; fluent and spontaneous with normal comprehension.  Cognition:    The patient is oriented to person, place, and time;     recent and remote memory intact;     language fluent;     normal attention, concentration,     fund of knowledge Cranial Nerves:    The pupils are equal, round, and reactive to light. The fundi areflat. Visual fields are full to finger confrontation. Extraocular movements are intact. Trigeminal sensation is intact and the muscles of mastication are normal. The face is symmetric. The palate elevates in the midline. Hearing intact. Voice is normal. Shoulder shrug is normal. The tongue has normal motion without fasciculations.  Coordination:    Normal finger to nose   Gait:    Normal native gait  Motor Observation:    No  asymmetry, no atrophy, and no involuntary movements noted. Tone:    Normal muscle tone.    Posture:    Posture is normal. normal erect    Strength:    Strength is V/V in the upper and lower limbs.      Sensation: intact to LT     Reflex Exam:  DTR's:    Deep tendon reflexes in the upper and lower extremities are normal bilaterally.   Toes:    The toes are downgoing bilaterally.   Clonus:    Clonus is absent.    Assessment/Plan:  74 y.o. male here as requested by Reynold Bowen, MD for memory impairment. PMHx obstructive sleep apnea treated with BiPAP, prediabetes, arthritis and chronic low back pain, paroxysmal A. fib status post ablation and off antiarrhythmic therapy, hypertension, hyperlipidemia, headache, chronic pain due to osteoarthritis. MMSE 28/30. Mother had Alzheimer's.  - Need formal neuropsych testing on this patient, mother had alzheimer's, he is exhibiting symptoms c/w memory loss more than stated age. Difficult to tell how much is chronic pain/medication related.  -MRI of the brain for reversible causes of dementia/memory loss - FDG PET scan may be informative in the future if needed - Blood work today    Orders Placed This Encounter  Procedures  . MR BRAIN W WO CONTRAST  . CBC  . Comprehensive metabolic panel  . B12 and Folate Panel  . Methylmalonic acid, serum  . TSH  . Homocysteine  . Vitamin B1  . Ambulatory referral to Neuropsychology   No orders of the defined types were placed in this encounter.   Cc: Reynold Bowen, MD  Sarina Ill, MD  [facility] [address] [place], [place] [address]  Phone [phone] Fax [phone]

## 2020-03-09 ENCOUNTER — Encounter: Payer: Self-pay | Admitting: Neurology

## 2020-03-09 ENCOUNTER — Telehealth: Payer: Self-pay | Admitting: Neurology

## 2020-03-09 ENCOUNTER — Other Ambulatory Visit: Payer: Self-pay

## 2020-03-09 ENCOUNTER — Ambulatory Visit (INDEPENDENT_AMBULATORY_CARE_PROVIDER_SITE_OTHER): Payer: Medicare HMO | Admitting: Neurology

## 2020-03-09 VITALS — BP 112/70 | HR 66 | Temp 97.0°F | Ht 70.0 in | Wt 162.0 lb

## 2020-03-09 DIAGNOSIS — G9340 Encephalopathy, unspecified: Secondary | ICD-10-CM

## 2020-03-09 DIAGNOSIS — G4733 Obstructive sleep apnea (adult) (pediatric): Secondary | ICD-10-CM | POA: Diagnosis not present

## 2020-03-09 DIAGNOSIS — Z820 Family history of epilepsy and other diseases of the nervous system: Secondary | ICD-10-CM | POA: Diagnosis not present

## 2020-03-09 DIAGNOSIS — R413 Other amnesia: Secondary | ICD-10-CM

## 2020-03-09 NOTE — Patient Instructions (Addendum)
Formal Memory Testing - Dr. Sima Matas MRI of the brain Blood work Then consider FDG PET Scan if needed   Memory Compensation Strategies  1. Use "WARM" strategy.  W= write it down  A= associate it  R= repeat it  M= make a mental note  2.   You can keep a Social worker.  Use a 3-ring notebook with sections for the following: calendar, important names and phone numbers,  medications, doctors' names/phone numbers, lists/reminders, and a section to journal what you did  each day.   3.    Use a calendar to write appointments down.  4.    Write yourself a schedule for the day.  This can be placed on the calendar or in a separate section of the Memory Notebook.  Keeping a  regular schedule can help memory.  5.    Use medication organizer with sections for each day or morning/evening pills.  You may need help loading it  6.    Keep a basket, or pegboard by the door.  Place items that you need to take out with you in the basket or on the pegboard.  You may also want to  include a message board for reminders.  7.    Use sticky notes.  Place sticky notes with reminders in a place where the task is performed.  For example: " turn off the  stove" placed by the stove, "lock the door" placed on the door at eye level, " take your medications" on  the bathroom mirror or by the place where you normally take your medications.  8.    Use alarms/timers.  Use while cooking to remind yourself to check on food or as a reminder to take your medicine, or as a  reminder to make a call, or as a reminder to perform another task, etc.

## 2020-03-09 NOTE — Telephone Encounter (Signed)
[employer] medicare/[employer] order sent to [facility]. They will obtain the auth and reach out to the patient to schedule.

## 2020-03-14 LAB — TSH: TSH: 1.54 u[IU]/mL (ref 0.450–4.500)

## 2020-03-14 LAB — CBC
Hematocrit: 43.3 % (ref 37.5–51.0)
Hemoglobin: 14.7 g/dL (ref 13.0–17.7)
MCH: 32 pg (ref 26.6–33.0)
MCHC: 33.9 g/dL (ref 31.5–35.7)
MCV: 94 fL (ref 79–97)
Platelets: 170 10*3/uL (ref 150–450)
RBC: 4.59 x10E6/uL (ref 4.14–5.80)
RDW: 12.8 % (ref 11.6–15.4)
WBC: 4.8 10*3/uL (ref 3.4–10.8)

## 2020-03-14 LAB — VITAMIN B1: Thiamine: 204.1 nmol/L — ABNORMAL HIGH (ref 66.5–200.0)

## 2020-03-14 LAB — METHYLMALONIC ACID, SERUM: Methylmalonic Acid: 168 nmol/L (ref 0–378)

## 2020-03-14 LAB — COMPREHENSIVE METABOLIC PANEL
ALT: 33 IU/L (ref 0–44)
AST: 28 IU/L (ref 0–40)
Albumin/Globulin Ratio: 2 (ref 1.2–2.2)
Albumin: 4 g/dL (ref 3.7–4.7)
Alkaline Phosphatase: 75 IU/L (ref 39–117)
BUN/Creatinine Ratio: 25 — ABNORMAL HIGH (ref 10–24)
BUN: 23 mg/dL (ref 8–27)
Bilirubin Total: 0.8 mg/dL (ref 0.0–1.2)
CO2: 25 mmol/L (ref 20–29)
Calcium: 9.3 mg/dL (ref 8.6–10.2)
Chloride: 103 mmol/L (ref 96–106)
Creatinine, Ser: 0.91 mg/dL (ref 0.76–1.27)
GFR calc Af Amer: 96 mL/min/{1.73_m2} (ref >59)
GFR calc non Af Amer: 83 mL/min/{1.73_m2} (ref >59)
Globulin, Total: 2 g/dL (ref 1.5–4.5)
Glucose: 90 mg/dL (ref 65–99)
Potassium: 4.4 mmol/L (ref 3.5–5.2)
Sodium: 141 mmol/L (ref 134–144)
Total Protein: 6 g/dL (ref 6.0–8.5)

## 2020-03-14 LAB — B12 AND FOLATE PANEL
Folate: >20 ng/mL (ref >3.0)
Vitamin B-12: 641 pg/mL (ref 232–1245)

## 2020-03-14 LAB — HOMOCYSTEINE: Homocysteine: 6.3 umol/L (ref 0.0–19.2)

## 2020-03-24 NOTE — Telephone Encounter (Signed)
[employer] medicare Josem KaufmannBT:[id number] (exp. 03/23/20 to 09/19/20) patient is scheduled at [facility] for 04/02/20.

## 2020-03-25 DIAGNOSIS — R69 Illness, unspecified: Secondary | ICD-10-CM | POA: Diagnosis not present

## 2020-03-26 ENCOUNTER — Other Ambulatory Visit: Payer: Self-pay

## 2020-03-26 ENCOUNTER — Ambulatory Visit
Admission: RE | Admit: 2020-03-26 | Discharge: 2020-03-26 | Disposition: A | Discharge status: Home / Self Care | Payer: Medicare HMO | Source: Ambulatory Visit | Attending: Neurology | Admitting: Neurology

## 2020-03-26 DIAGNOSIS — G9340 Encephalopathy, unspecified: Secondary | ICD-10-CM | POA: Diagnosis not present

## 2020-03-26 DIAGNOSIS — R413 Other amnesia: Secondary | ICD-10-CM | POA: Diagnosis not present

## 2020-03-26 MED ORDER — GADOBENATE DIMEGLUMINE 529 MG/ML IV SOLN
15.0000 mL | Freq: Once | INTRAVENOUS | Status: AC | PRN
  Administered 2020-03-26: 15 mL via INTRAVENOUS

## 2020-03-29 ENCOUNTER — Telehealth: Payer: Self-pay | Admitting: Neurology

## 2020-03-29 NOTE — Telephone Encounter (Signed)
Overall his MRI was unremarkable, no strokes or other serious matters. There is atrophy which is normal for age. There is mild to moderate white matter changes, these can be seen in normal aging but also get worse with vascular risk factors. I can't say whether his are involved in his memory loss but they might be contributing. At this time the neuropsych testing will be very important to tease these things out, I see he is schedule in July and we should meet afterwards. In the meantime I am happy to show them the MRI.  thanks

## 2020-03-29 NOTE — Telephone Encounter (Signed)
Called pt/wife (on DPR) and LVM (ok per DPR) with detailed MRI results as noted below. I left office number for call back if they would like to schedule an appointment to see the imaging with Dr. Jaynee Eagles. Also asked for a call back when they have both appts scheduled with Dr. Sima Matas (testing & results f/u) so we can schedule with Dr. Jaynee Eagles for a follow-up.

## 2020-04-02 ENCOUNTER — Other Ambulatory Visit: Payer: Medicare HMO

## 2020-04-05 ENCOUNTER — Other Ambulatory Visit: Payer: Medicare HMO

## 2020-04-09 DIAGNOSIS — G4733 Obstructive sleep apnea (adult) (pediatric): Secondary | ICD-10-CM | POA: Diagnosis not present

## 2020-04-21 ENCOUNTER — Other Ambulatory Visit: Payer: Medicare HMO

## 2020-05-04 DIAGNOSIS — L570 Actinic keratosis: Secondary | ICD-10-CM | POA: Diagnosis not present

## 2020-05-04 DIAGNOSIS — Z85828 Personal history of other malignant neoplasm of skin: Secondary | ICD-10-CM | POA: Diagnosis not present

## 2020-05-04 DIAGNOSIS — L821 Other seborrheic keratosis: Secondary | ICD-10-CM | POA: Diagnosis not present

## 2020-05-09 DIAGNOSIS — G4733 Obstructive sleep apnea (adult) (pediatric): Secondary | ICD-10-CM | POA: Diagnosis not present

## 2020-05-12 ENCOUNTER — Encounter: Payer: Self-pay | Admitting: Psychology

## 2020-05-12 ENCOUNTER — Encounter: Payer: Self-pay | Admitting: Gastroenterology

## 2020-05-27 ENCOUNTER — Encounter: Payer: Medicare HMO | Attending: Psychology | Admitting: Psychology

## 2020-05-27 ENCOUNTER — Other Ambulatory Visit: Payer: Self-pay

## 2020-05-27 ENCOUNTER — Encounter: Payer: Self-pay | Admitting: Psychology

## 2020-05-27 DIAGNOSIS — R413 Other amnesia: Secondary | ICD-10-CM

## 2020-05-27 DIAGNOSIS — Z820 Family history of epilepsy and other diseases of the nervous system: Secondary | ICD-10-CM | POA: Diagnosis not present

## 2020-05-27 NOTE — Progress Notes (Signed)
Neuropsychological Consultation   Patient:   Alan Knight.   DOB:   1946-09-17  MR Number:  [id number]  Location:  [facility] [place] [id number] [address] [place] [address] Dept: [phone]           Date of Service:   05/27/2020  Start Time:   3 PM End Time:   5 PM  Today's visit was an in person visit that was conducted in my outpatient clinic office.  Myself, the patient and his wife were present for this visit.  The first hour and 15 minutes was utilized with the face-to-face clinical interview and history and the other 45 minutes was utilized with report writing and records reviewed.  Provider/Observer:  Ilean Skill, Psy.D.       Clinical Neuropsychologist       Billing Code/Service: [id number]  Chief Complaint:    Alan Knight. Alan Knight, [place]. is a 74 year old male referred by Sarina Ill MD with [facility] for a neuropsychological evaluation because of complaints of slowly progressive memory changes over the past several years.  The patient was initially referred for neurological evaluation by the patient is primary care physician Reynold Bowen, MD.  While the patient generally denies or minimizes any memory changes over the years most of these difficulties are identified by his wife with the patient ultimately agreeing to her assessment.  The patient's wife reports that she began noticing issues about 10 years ago around the time he had has not left knee replacement and reports that it was initially attributed to the knee replacement and pain in through the years to other things until very recently the gradual worsening of his symptoms became more of an issue.  The patient's wife reports that cueing helps with his memory recall.  The patient and his wife deny any symptoms of geographic disorientation, tremor, or any persistent visual hallucinations.   There is a family history of diagnosis of Alzheimer's with his mother.  There was a brief period of time where the patient had some visual hallucinations during an outbreak of shingles where he took Valtrex and the symptoms went away once the Valtrex was stopped.  No other episodes are noted.  The patient's memory issues became more noticeable in the past 5 or so years since he retired with more worsening in the setting of chronic pain.  Reason for Service:  Alan Knight. Alan Knight, [place]. is a 74 year old male referred by Sarina Ill MD with [facility] for a neuropsychological evaluation because of complaints of slowly progressive memory changes over the past several years.  The patient was initially referred for neurological evaluation by the patient is primary care physician Reynold Bowen, MD.  While the patient generally denies or minimizes any memory changes over the years most of these difficulties are identified by his wife with the patient ultimately agreeing to her assessment.  The patient's wife reports that she began noticing issues about 10 years ago around the time he had has not left knee replacement and reports that it was initially attributed to the knee replacement and pain in through the years to other things until very recently the gradual worsening of his symptoms became more of an issue.  The patient's wife reports that cueing helps with his memory recall.  The patient and his wife deny any symptoms of geographic disorientation, tremor, or any persistent visual hallucinations.  There is a  family history of diagnosis of Alzheimer's with his mother.  There was a brief period of time where the patient had some visual hallucinations during an outbreak of shingles where he took Valtrex and the symptoms went away once the Valtrex was stopped.  No other episodes are noted.  The patient's memory issues became more noticeable in the past 5 or so years since he retired with more worsening in  the setting of chronic pain.  The patient has a past medical history of obstructive sleep apnea and has been compliant with use of his BiPAP machine throughout.  The patient also has been diagnosed with prediabetes, arthritis and chronic low back pain, proximal A. fib status post ablation and no ongoing antiarrhythmic therapy, hypertension, hyperlipidemia, headache, and chronic pain due to osteoarthritis.  The patient has scoliosis type curvature in his back as well as being identified with degenerative disc and issues with significant pain in his back and ongoing pain in his knee.  The patient has had a left knee replacement done some years ago.  The patient does well with general daily activities and while he does not have the responsibility of pain bills his wife feels like he would be able to handle that fine.  The patient has had some difficulty managing his medications and appointments but this is usually around times when there has been changes to his medications.  The patient has always been a very organized and fastidious individual with some obsessive-compulsive type traits without life disturbance without complete obsessive-compulsive disorder.  The patient has had multiple surgeries.  The patient had knee surgery on multiple occasions and knee replacement surgery.  The patient has had 4 shoulder surgeries and one hip replacement surgery as well as a heart ablation procedure.  The patient has had ablative therapies in his back for pain as well as spinal stenosis injections.  After some of his orthopedic interventions.  The patient continues with ongoing low back pain but still does work in the yard and takes his dog for regular walks.  The patient is always been actively involved in athletics and physical activities but these have been significantly impacted more recently particularly with his back pain limiting his playing golf.  The patient reports that he regularly uses his BiPAP machine which  helps with sleep even though he does not like wearing it.  The patient reports that his appetite is fine.  The patient has had 2 prior MRI of brain done.  The first was done in 2016 and the second 1 was conducted in April 2021.  Both of these studies suggested mild generalized cortical atrophy typical for age with no progression between 2016 and 2021.  There were also some scattered T2/flair hyperintense foci consistent with mild to moderate chronic microvascular ischemic changes, which had shown some progression when compared to 2016 MRI.  There were no acute findings noted in either MRI.  Reliability of Information: Information is derived from a face-to-face clinical interview with both the patient and his wife as well as review of available medical records.  Behavioral Observation: Alan Charlot.  presents as a 74 y.o.-year-old Ambidextrous but primarily right-handed Caucasian Male who appeared his stated age. his dress was Appropriate and he was Well Groomed and his manners were Appropriate to the situation.  his participation was indicative of Appropriate and Attentive behaviors.  There were physical disabilities noted related to apparent pain symptoms.  he displayed an appropriate level of cooperation and motivation.  Interactions:    Active Appropriate and Attentive  Attention:   within normal limits and attention span and concentration were age appropriate  Memory:   abnormal; remote memory intact, recent memory impaired  Visuo-spatial:  not examined  Speech (Volume):  normal  Speech:   normal; normal  Thought Process:  Coherent and Relevant  Though Content:  WNL; not suicidal and not homicidal  Orientation:   person, place, time/date and situation  Judgment:   Good  Planning:   Good  Affect:    Appropriate  Mood:    Euthymic  Insight:   Good  Intelligence:   high  Marital Status/Living: The patient was born and raised in [place] along with 4  siblings.  His mother's pregnancy delivery and early developmental milestones were met at the appropriate time.  The patient is married and was married in 1974 and has been married for 47 years.  No previous marriages.  The patient and his wife continue to live together and described their marriage and relationship is quite good and positive.  They have no children.  Current Employment: The patient is retired.  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  Hobbies and interests have included playing golf, yard work and going to [employer] and exercising regularly.  The patient served in the [employer] between [place] in 1970.  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  Substance Use:  No concerns of substance abuse are reported.  The patient has very limited use of alcohol and currently has not been drinking any at all.  Education:   The patient graduated from [employer] high school in [place] and maintained essentially a B average throughout.  Best subject was history and science he had some difficulties with algebra.  Extracurricular activities included sports an afterschool job.  Medical History:   Past Medical History:  Diagnosis Date  . Arthritis   . Basal cell carcinoma   . BPH (benign prostatic hypertrophy) with urinary obstruction   . Chest tightness 02/21/2017  . Chronic bilateral low back pain without sciatica 05/17/2017  . Chronic pain of both knees 10/03/2017  . Chronic pain syndrome 10/03/2017  . Cough 02/21/2017  . Diverticulosis   . GERD  (gastroesophageal reflux disease)   . Headache   . Hyperlipidemia   . Hypertension   . Hypogonadism male   . Internal hemorrhoids   . Migraine   . OA (osteoarthritis)   . OSA treated with BiPAP 11/13/2018   moderate obstructive sleep apnea with an AHI of 19.9/h with no central sleep apnea.  Oxygen desaturations were as low as 89%.  Now on BIPAP at 18/14cm H2O.  . Osteoarthritis 02/21/2017  . Paroxysmal atrial fibrillation (HCC)   . Primary osteoarthritis of left knee 11/09/2015  . S/P total knee replacement using cement 11/09/2015  . Spondylosis without myelopathy or radiculopathy, lumbar region 05/17/2017  . Status post left knee replacement 10/03/2017   Overview:  Added automatically from request for surgery [id number]        Abuse/Trauma History:  The patient does not describe any history of abuse or trauma and does not describe any issues that happened during his active duty service in [place] has been individually traumatic.  Psychiatric History:  No prior psychiatric history.  Family Med/Psych History:  Family History  Problem Relation Age of Onset  . Heart attack Father   . CVA Brother   . Memory loss Mother        was in memory care for 15+ years  . Colon cancer Other        uncle according to 2011 colon report   Impression/DX:  Alan Knight. Alan Knight, [place]. is a 74 year old male referred by Sarina Ill MD with [facility] for a neuropsychological evaluation because of complaints of slowly progressive memory changes over the past several years.  The patient was initially referred for neurological evaluation by the patient is primary care physician Reynold Bowen, MD.  While the patient generally denies or minimizes any memory changes over the years most of these difficulties are identified by his wife with the patient ultimately agreeing to her assessment.  The patient's wife reports that she began noticing issues about 10 years ago around the time he had has not left  knee replacement and reports that it was initially attributed to the knee replacement and pain in through the years to other things until very recently the gradual worsening of his symptoms became more of an issue.  The patient's wife reports that cueing helps with his memory recall.  The patient and his wife deny any symptoms of geographic disorientation, tremor, or any persistent visual hallucinations.  There is a family history of diagnosis of Alzheimer's with his mother.  There was a brief period of time where the patient had some visual hallucinations during an outbreak of shingles where he took Valtrex and the symptoms went away once the Valtrex was stopped.  No other episodes are noted.  The patient's memory issues became more noticeable in the past 5 or so years since he retired with more worsening in the setting of chronic pain.   Disposition/Plan:  We have set the patient up for formal neuropsychological testing to conduct a broad objective assessment of a wide range of neuropsychological and cognitive domains with particular emphasis on memory and learning components.  We will utilize a baseline battery of the Wechsler Adult Intelligence scale-IV as well as the Wechsler Memory Scale-IV.  Once these are completed determination will be made as to whether there is other cognitive measures that need to be conducted.  Once these objective assessment procedures have been completed a formal report will be produced and provided to his referring physician as well as providing feedback to the patient regarding the findings and results as well as recommendations.  Diagnosis:    Memory loss  Family history of Alzheimer's disease         Electronically Signed   _______________________ Ilean Skill, Psy.D.

## 2020-06-21 ENCOUNTER — Encounter: Payer: Medicare HMO | Admitting: Psychology

## 2020-06-21 ENCOUNTER — Encounter: Payer: Self-pay | Admitting: Psychology

## 2020-06-21 ENCOUNTER — Other Ambulatory Visit: Payer: Self-pay

## 2020-06-21 DIAGNOSIS — R413 Other amnesia: Secondary | ICD-10-CM | POA: Diagnosis not present

## 2020-06-21 DIAGNOSIS — Z820 Family history of epilepsy and other diseases of the nervous system: Secondary | ICD-10-CM | POA: Diagnosis not present

## 2020-06-21 NOTE — Progress Notes (Signed)
Neuropsychology Note  Alan Knight. completed 240 minutes of neuropsychological testing with this provider. The patient arrived on time to his testing appointment.The patient did not appear overtly distressed by the testing session, per behavioral observation. Rest breaks were offered.   Clinical Decision Making: In considering the patient's current level of functioning, level of presumed impairment, nature of symptoms, emotional and behavioral responses during the interview, level of literacy, and observed level of motivation/effort, a battery of tests was selected and administered to the patient. Changes were made as deemed necessary based on patient performance on testing, technician observations and additional pertinent factors such as those listed above.    Mental Status/Behavioral Observations:    He was appropriately attired and well groomed. He ambulated independently without assistance with noticeable weakness on left right side. The patient was alert and oriented to person, place, time, and situation. His mood was anxious and depressed. He displayed good eye contact during the evaluation and openly answered all questions posed by the examiner. Psychomotor agitation noted. Rapport was good and easily established. Attention was variable and poorly sustained at times. Speech was mostly clear, goal oriented and prosodic with normal volume. Language was fluent with no evidence of poor articulation. Mild-moderate word finding difficulty was noted. Thought processes appeared logical and linear, with no evidence of psychotic symptoms such as delusions and/or hallucinations. Memory appeared largely intact for remote information but relatively weak for more recent material. Insight and judgement seemed fair. He was cooperative with the evaluation and appeared to put forth his best effort.    Tests Administered: . Wechsler Adult Intelligence Scale, 4th Edition (WAIS-IV) . Wechsler Memory Scale, 4th  edition, Older Adult Battery (WMS-IV-OA)  Alan Knight. will return on 08/19/20 for an interactive feedback session with Dr. Sima Matas at which time his test performances, clinical impressions and treatment recommendations will be reviewed in detail. The patient understands he can contact our office should he require our assistance before this time.  240 minutes spent face-to-face with patient administering and scoring standardized tests. [CPT T4911252, 95638]  Results:    Composite Score Summary  Scale Sum of Scaled Scores Composite Score Percentile Rank 95% Conf. Interval Qualitative Description  Verbal Comprehension 20 VCI 81 10 76-87 Low Average  Perceptual Reasoning 27 PRI 94 34 88-101 Average  Working Memory 17 WMI 92 30 86-99 Average  Processing Speed 12 PSI 79 8 73-89 Borderline  Full Scale 76 FSIQ 83 13 79-87 Low Average  General Ability 47 GAI 86 18 81-91 Low Average    Index Level Discrepancy Comparisons  Comparison Score 1 Score 2 Difference Critical Value .05 Significant Difference Y/N Base Rate by Overall Sample  VCI - PRI 81 94 -13 8.31 Y 17.4  VCI - WMI 81 92 -11 9.30 Y 20.0  VCI - PSI 81 79 2 10.19 N 46.2  PRI - WMI 94 92 2 10.18 N 46.5  PRI - PSI 94 79 15 11.00 Y 16.8  WMI - PSI 92 79 13 11.76 Y 19.1  FSIQ - GAI 83 86 -3 3.61 N 30.3     Differences Between Subtest and Overall Mean of Subtest Scores  Subtest Subtest Scaled Score Mean Scaled Score Difference Critical Value .05 Strength or Weakness Base Rate  Block Design 11 7.60 3.40 2.85 S 10%  Similarities 7 7.60 -0.60 2.82  >25%  Digit Span 9 7.60 1.40 2.22  >25%  Matrix Reasoning 9 7.60 1.40 2.54  >25%  Vocabulary 6 7.60 -  1.60 2.03  >25%  Arithmetic 8 7.60 0.40 2.73  >25%  Symbol Search 3 7.60 -4.60 3.42 W 5-10%  Visual Puzzles 7 7.60 -0.60 2.71  >25%  Information 7 7.60 -0.60 2.19  >25%  Coding 9 7.60 1.40 2.97  >25%    Index Score Summary  Index Sum of Scaled Scores Index Score  Percentile Rank 95% Confidence Interval Qualitative Descriptor  Auditory Memory (AMI) 13 58 0.3 54-66 Extremely Low  Visual Memory (VMI) 5 54 0.1 50-60 Extremely Low  Immediate Memory (IMI) 12 63 1 59-71 Extremely Low  Delayed Memory (DMI) 6 49 <0.1 45-61 Extremely Low     Primary Subtest Scaled Score Summary  Subtest Domain Raw Score Scaled Score Percentile Rank  Logical Memory I AM 15 4 2   Logical Memory II AM 0 1 0.1  Verbal Paired Associates I AM 6 4 2   Verbal Paired Associates II AM 2 4 2   Visual Reproduction I VM 18 4 2   Visual Reproduction II VM 0 1 0.1  Symbol Span VWM 12 8 25     Auditory Memory Process Score Summary  Process Score Raw Score Scaled Score Percentile Rank Cumulative Percentage (Base Rate)  LM II Recognition 14 - - 3-9%  VPA II Recognition 14 - - <=2%   Visual Memory Process Score Summary  Process Score Raw Score Scaled Score Percentile Rank Cumulative Percentage (Base Rate)  VR II Recognition 1 - - 3-9%     ABILITY-MEMORY ANALYSIS  Ability Score:  GAI: 86 Date of Testing:  WAIS-IV; WMS-IV 2020/06/21  Predicted Difference Method   Index Predicted WMS-IV Index Score Actual WMS-IV Index Score Difference Critical Value  Significant Difference Y/N Base Rate  Auditory Memory 93 58 35 10.41 Y <1%  Visual Memory 92 54 38 7.89 Y <1%  Immediate Memory 91 63 28 9.97 Y <1%  Delayed Memory 92 49 43 12.33 Y <1%  Statistical significance (critical value) at the .01 level.   Contrast Scaled Scores  Score Score 1 Score 2 Contrast Scaled Score  General Ability Index vs. Auditory Memory Index 86 58 2  General Ability Index vs. Visual Memory Index 86 54 1  General Ability Index vs. Immediate Memory Index 86 63 2  General Ability Index vs. Delayed Memory Index 86 49 1  Verbal Comprehension Index vs. Auditory Memory Index 81 58 2  Perceptual Reasoning Index vs. Visual Memory Index 94 54 1  Working Memory Index vs. Auditory Memory Index 92 58 1       Full report to follow.

## 2020-06-23 DIAGNOSIS — M25562 Pain in left knee: Secondary | ICD-10-CM | POA: Diagnosis not present

## 2020-06-23 DIAGNOSIS — M25561 Pain in right knee: Secondary | ICD-10-CM | POA: Diagnosis not present

## 2020-06-23 DIAGNOSIS — Z79899 Other long term (current) drug therapy: Secondary | ICD-10-CM | POA: Diagnosis not present

## 2020-06-23 DIAGNOSIS — G8929 Other chronic pain: Secondary | ICD-10-CM | POA: Diagnosis not present

## 2020-06-29 ENCOUNTER — Encounter: Payer: Medicare HMO | Attending: Psychology | Admitting: Psychology

## 2020-06-29 ENCOUNTER — Other Ambulatory Visit: Payer: Self-pay

## 2020-06-29 DIAGNOSIS — F0280 Dementia in other diseases classified elsewhere without behavioral disturbance: Secondary | ICD-10-CM

## 2020-06-29 DIAGNOSIS — Z820 Family history of epilepsy and other diseases of the nervous system: Secondary | ICD-10-CM | POA: Insufficient documentation

## 2020-06-29 DIAGNOSIS — M48061 Spinal stenosis, lumbar region without neurogenic claudication: Secondary | ICD-10-CM | POA: Diagnosis not present

## 2020-06-29 DIAGNOSIS — R413 Other amnesia: Secondary | ICD-10-CM | POA: Diagnosis not present

## 2020-06-29 DIAGNOSIS — G894 Chronic pain syndrome: Secondary | ICD-10-CM

## 2020-06-29 DIAGNOSIS — G301 Alzheimer's disease with late onset: Secondary | ICD-10-CM | POA: Diagnosis not present

## 2020-07-07 ENCOUNTER — Encounter: Payer: Self-pay | Admitting: Psychology

## 2020-07-07 NOTE — Progress Notes (Signed)
Neuropsychological Evaluation   Patient:  Alan Knight.   DOB: [birth year]  MR Number: [id number]  Location: [facility] PHYSICAL MEDICINE AND REHABILITATION [place], [place] [id number] [facility] [place] [id number] Dept: [phone]  Start: 8 AM End: 9 AM  Provider/Observer:     Edgardo Roys PsyD  Chief Complaint:      Chief Complaint  Patient presents with  . Memory Loss    Reason For Service:      Alan Knight. Alan, Knight. is a 74 year old male referred by Sarina Ill MD with [facility] for a neuropsychological evaluation because of complaints of slowly progressive memory changes over the past several years.  The patient was initially referred for neurological evaluation by the patient is primary care physician Reynold Bowen, MD.  While the patient generally denies or minimizes any memory changes over the years most of these difficulties are identified by his wife with the patient ultimately agreeing to her assessment.  The patient's wife reports that she began noticing issues about 10 years ago around the time he had a left knee replacement and reports that it was initially attributed to the knee replacement and pain through the years or to other things until very recently the gradual worsening of his symptoms became more of an issue.  The patient's wife reports that cueing helps with his memory recall.  The patient and his wife deny any symptoms of geographic disorientation, tremor, or any persistent visual hallucinations.  There is a family history of diagnosis of Alzheimer's with his mother.  There was a brief period of time where the patient had some visual hallucinations during an outbreak of shingles where he took Valtrex and the symptoms went away once the Valtrex was stopped.  No other episodes are noted.  The patient's memory issues became more noticeable in the past 5 or so years since he  retired with more worsening in the setting of chronic pain.  The patient has a past medical history of obstructive sleep apnea and has been compliant with use of his BiPAP machine throughout.  The patient also has been diagnosed with prediabetes, arthritis and chronic low back pain, proximal A. fib status post ablation and no ongoing antiarrhythmic therapy, hypertension, hyperlipidemia, headache, and chronic pain due to osteoarthritis.  The patient has scoliosis type curvature in his back as well as being identified with degenerative disc and issues with significant pain in his back and ongoing pain in his knee.  The patient has had a left knee replacement done some years ago.  The patient does well with general daily activities and while he does not have the responsibility of paying bills, his wife feels like he would be able to handle that fine.  The patient has had some difficulty managing his medications and appointments but this is usually around times when there has been changes to his medications.  The patient has always been a very organized and fastidious individual with some obsessive-compulsive type traits without life disturbance and without complete obsessive-compulsive disorder.  The patient has had multiple surgeries.  The patient had knee surgery on multiple occasions and knee replacement surgery.  The patient has had 4 shoulder surgeries and one hip replacement surgery as well as a heart ablation procedure.  The patient has had ablative therapies in his back for pain as well as spinal stenosis injections.  After some of his orthopedic interventions.  The patient continues with  ongoing low back pain but still does work in the yard and takes his dog for regular walks.  The patient has always been actively involved in athletics and physical activities but these have been significantly impacted more recently particularly with his back pain limiting his playing golf.  The patient reports that he  regularly uses his BiPAP machine which helps with sleep even though he does not like wearing it.  The patient reports that his appetite is fine.  The patient has had 2 prior MRI of brain done.  The first was done in 2016 and the second 1 was conducted in April 2021.  Both of these studies suggested mild generalized cortical atrophy typical for age with no progression between 2016 and 2021.  There were also some scattered T2/flair hyperintense foci consistent with mild to moderate chronic microvascular ischemic changes, which had shown some progression when compared to 2016 MRI.  There were no acute findings noted in either MRI.   Mental Status/Behavioral Observations:                He was appropriately attired and well groomed. He ambulated independently without assistance with noticeable weakness on left right side. The patient was alert and oriented to person, place, time, and situation. His mood was anxious and depressed. He displayed good eye contact during the evaluation and openly answered all questions posed by the examiner. Psychomotor agitation noted. Rapport was good and easily established. Attention was variable and poorly sustained at times. Speech was mostly clear, goal oriented and prosodic with normal volume. Language was fluent with no evidence of poor articulation. Mild-moderate word finding difficulty was noted. Thought processes appeared logical and linear, with no evidence of psychotic symptoms such as delusions and/or hallucinations. Memory appeared largely intact for remote information but relatively weak for more recent material. Insight and judgement seemed fair. He was cooperative with the evaluation and appeared to put forth his best effort.    Tests Administered:  Wechsler Adult Intelligence Scale, 4th Edition (WAIS-IV)  Wechsler Memory Scale, 4th edition, Older Adult Battery (WMS-IV-OA)   Test Results:   Initially, an estimation was made as to historical/premorbid  intellectual and cognitive functioning.  Taking into account various psychosocial measures such as educational and occupational history as well as hobbies and interest it is estimated that the patient likely performed in the average to high average range relative to a normative population historically.  We will utilize standardized T score measures of 110 as a general estimation and scaled score measures of 10 for conservative estimations for historical/premorbid functioning.  Composite Score Summary  Scale Sum of Scaled Scores Composite Score Percentile Rank 95% Conf. Interval Qualitative Description  Verbal Comprehension 20 VCI 81 10 76-87 Low Average  Perceptual Reasoning 27 PRI 94 34 88-101 Average  Working Memory 17 WMI 92 30 86-99 Average  Processing Speed 12 PSI 79 8 73-89 Borderline  Full Scale 76 FSIQ 83 13 79-87 Low Average  General Ability 47 GAI 86 18 81-91 Low Average   The patient was initially administered the Wechsler adult intelligence scale-IV to allow for a highly standardized well normed measure that assesses a broad range of intellectual and cognitive functioning.  While this measure calculates a full-scale IQ score and general abilities score of these measures should not be used as an accurate description of historical/premorbid functioning as the patient is described by himself and family members is having significant cognitive changes over the past several years and should therefore be looked  at as a description of his current global and specific levels of cognitive functioning.  The patient produced a full-scale IQ score of 83 which falls at the 13th percentile and is in the low average range relative to a normative population matched for his age and gender.  This global composite score is significantly below predicted levels of premorbid functioning falling more than 20 points below predicted levels.  We also calculated the patient's general abilities index score which  places less emphasis on measures most sensitive to acute changes including working memory/auditory encoding as well as information processing speed and focus execute abilities.  The patient produced a general abilities index score of 86 which falls at the 18th percentile and is in the low average range and is also significantly below predicted levels of premorbid functioning.   Verbal Comprehension Subtests Summary  Subtest Raw Score Scaled Score Percentile Rank Reference Group Scaled Score SEM  Similarities 16 7 16 5  0.95  Vocabulary 20 6 9 6  0.67  Information 9 7 16 8  0.73   The patient produced a verbal comprehension index score of 81 which falls at the 10th percentile and is in the low average range relative to a normative population.  This is significantly below predicted levels.  There was little variability in subtest performance.  The patient showed mild to moderate impairments with regard to verbal reasoning and problem-solving, vocabulary and word knowledge, and general fund of information.  All of these subtest are significantly below predicted levels of function in these areas.   Perceptual Reasoning Subtests Summary  Subtest Raw Score Scaled Score Percentile Rank Reference Group Scaled Score SEM  Block Design 32 11 63 7 0.99  Matrix Reasoning 10 9 37 5 0.90  Visual Puzzles 7 7 16 5  0.99   The patient produced a perceptual reasoning index score of 94 which falls at the 34th percentile and is in the average range.  There was some considerable variability in subtest performance.  The patient performed well on measures of visual analysis and organization and performed adequately on measures of visual reasoning and problem solving but did have some difficulties with visual estimation and judgment   Working Memory Subtests Summary  Subtest Raw Score Scaled Score Percentile Rank Reference Group Scaled Score SEM  Digit Span 22 9 37 7 0.73  Arithmetic 11 8 25 8  1.20   The patient  produced a working memory index score of 92 which falls at the 30th percentile relative to a normative population.  There was little variability in subtest performance.  The patient performed in the lower end of the average range on measures of auditory encoding abilities as well as only showing very mild weaknesses relative to predicted levels on measures of encoding and processing of that encoded information.   Processing Speed Subtests Summary  Subtest Raw Score Scaled Score Percentile Rank Reference Group Scaled Score SEM  Symbol Search 8 3 1 1  1.12  Coding 44 9 37 5 1.12   The patient produced a processing speed index score of 79 which falls at the 8th percentile and is in the borderline range relative to a normative population.  The patient showed considerable variability on these measures.  The patient performed better on measures requiring vertical scanning functions but showed significant deficits on measures of horizontal visual scanning and overall his speed of mental operations was down although at least one of the subtest performances were generally in the average range relative to a normative population.  The measure that had the most complex visual spatial components was clearly the most impaired.  Index Score Summary  Index Sum of Scaled Scores Index Score Percentile Rank 95% Confidence Interval Qualitative Descriptor  Auditory Memory (AMI) 13 58 0.3 54-66 Extremely Low  Visual Memory (VMI) 5 54 0.1 50-60 Extremely Low  Immediate Memory (IMI) 12 63 1 59-71 Extremely Low  Delayed Memory (DMI) 6 49 <0.1 45-61 Extremely Low   The patient was then administered the Wechsler memory scale-IV for older adults.  On prerequisite elements of memory and learning (encoding ability) the patient showed generally intact auditory and visual encoding.  On the auditory encoding measures from the Wechsler Adult Intelligence Scale he performed in the average to low average range relative to a normative  population.  On the visual encoding measures from the [place] the patient also performed at the lower end of the average range.  These patterns would suggest that he has adequate visual and auditory encoding to allow for information to be potentially stored and organized into short-term and intermediate memory stores.  Breaking the patient's memory components down between auditory versus visual memo the patient produced an auditory memory index score of 58 which falls below the 1st percentile and is in the extremely low range relative to a normative population.  The patient showed similar performance on visual memory components as he produced a visual memory index score of 54 which also falls below the 1st percentile.  There was no difference between auditory versus visual memory deficits.  On the immediate memory index score the patient produced an index score of 63 which falls at the 1st percentile and is also in the extremely low range relative to a normative population.  The patient showed significant loss of information over period of delay and he produced a delayed memory index score of 49 which falls below the .1 percentile.  Relative to a normative population.  This pattern does suggest significant impairments with regard to both immediate and delayed memory and even the information that is initially remembered after a short delay is continued to be lost with significant impairments for later recall.  As can be seen in the tables below we also looked at delayed recall versus recognition/cued recall functioning.  This allows [place] to determine whether the information actually was impaired relative to storage and organization versus being a retrieval deficit.  The patient performed very poorly on cued/recognition formats and showed little to no improvement in memory and recall of information under acute recall format.   Logical Memory  Score Score 1 Score 2 Contrast Scaled Score  LM II  Recognition vs. Delayed Recall 3-9% 1 1  LM Immediate Recall vs. Delayed Recall 4 1 1    Verbal Paired Associates  Score Score 1 Score 2 Contrast Scaled Score  VPA II Recognition vs. Delayed Recall <=2% 4 10  VPA Immediate Recall vs. Delayed Recall 4 4 9    Visual Reproduction  Score Score 1 Score 2 Contrast Scaled Score  VR II Recognition vs. Delayed Recall 3-9% 1 3  VR Immediate Recall vs. Delayed Recall 4 1 3       ABILITY-MEMORY ANALYSIS  Ability Score:  GAI: 86 Date of Testing:  WAIS-IV; WMS-IV 2020/06/21  Predicted Difference Method   Index Predicted WMS-IV Index Score Actual WMS-IV Index Score Difference Critical Value  Significant Difference Y/N Base Rate  Auditory Memory 93 58 35 10.41 Y <1%  Visual Memory 92 54 38 7.89 Y <1%  Immediate Memory 91 63 28 9.97 Y <1%  Delayed Memory 92 49 43 12.33 Y <1%  Statistical significance (critical value) at the .01 level.   To further analyze memory and learning functions I calculated the ability-memory analysis.  For this analysis the patient's general abilities index score was used to generate a predicted score on various memory and learning indices.  This predicted score was then used in comparison to actual achieved levels of performance on memory indices.  The patient showed significant and severe deficits on all areas of memory and learning assessed and this predicted model and there were severe deficits for auditory and visual memory as well as immediate and delayed memory.  While the patient's auditory and visual encoding were generally within normal limits and not indicative of significant impairments the patient showed severe memory and learning deficits globally without improvement in cueing or recall formats.  Summary of Results:   The results of the objective neuropsychological measures administered do show significant and severe memory deficits.  The patient displays both auditory and visual memory deficits that are  significant and these are identified in both immediate and delayed memory functions.  The patient has adequate and well maintained auditory and visual encoding abilities.  The patient also shows severe deficits with regard to cued/recognition formats of memory functions.  This pattern strongly suggest that memory deficits are global in nature and have to do primarily with deficits for storage and organization of information and not simply deficits for attention and/or retrieval of actually learned information.  As far as other cognitive functioning the patient performed adequately on measures of visual reasoning and problem solving and did very well on measures of visual analysis and organization.  The patient also performed adequately on measures of pure information processing speed without significant visual complexity.  However, the patient showed poor verbal reasoning and problem-solving, reduced lexicon/vocabulary availability and reductions in his overall general fund of information.  The patient showed significant deficits on measures of visual estimation and judgment as well as difficulty with visual processing speed with more complex visual information.    Impression/Diagnosis:   Overall, the results of the current neuropsychological evaluation are consistent with patterns typically seen with cortically based progressive dementia.  Taking into account subjective symptom reports by the patient and family members describing progressive reduction in overall cognitive and memory functions over the past several years, past medical history and neuroimaging functioning as well as the current objective cognitive and neuropsychological testing performance, the pattern would be generally consistent with those typically found with the diagnosis of late onset dementia of the Alzheimer's type.  The patient does not show any focal areas of cortical atrophy or indications of prior cerebrovascular events.  The patient  does have some scattered hyperintense foci consistent with mild to moderate chronic microvascular ischemic changes that have progressed between MRIs conducted in 2016 versus 2021.  However, the pattern of overall information processing speed measures as well as the pattern of strengths and weaknesses on a broad range of cognitive domains do not suggest that the patient's deficits are simply related to typical cerebrovascular changes with age.  The patient has no reports of tremor or hallucination/delusions but does have a family history of Alzheimer's.  The patient also has a history of significant chronic pain but the level of memory deficits and other cognitive deficits in the pattern of these deficits could not be explained simply because of significant chronic pain or simply attributed to microvascular ischemic changes.  As far as recommendations, it will be important to continue to address and treat his significant back, knee and other chronic pain conditions and symptoms.  The patient may also benefit from a trial of medications addressing Aceta choline functioning as they may provide some acute benefits as far as memory and cognition.  The patient is compliant with his BiPAP device and it will be very important for him to continue to remain compliant with his BiPAP.  Good nutritional aspects and monitoring nutrients such as vitamin D and B12 will also be important going forward and the patient should remain as physically active as possible given the limitations he experiences due to pain.  The patient should also be set up for repeat neuropsychological testing in approximately 9 months to assess for any progressive changes and continue to provide effective and useful ongoing recommendations.  I will provide feedback regarding the results of the current neuropsychological evaluation to the patient and his family.  A copy of this form report will also be available to his neurologist and PCP for their  review.  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 Neuropsychologist

## 2020-07-08 ENCOUNTER — Encounter: Payer: Medicare HMO | Admitting: Psychology

## 2020-07-16 DIAGNOSIS — M859 Disorder of bone density and structure, unspecified: Secondary | ICD-10-CM | POA: Diagnosis not present

## 2020-07-16 DIAGNOSIS — E7849 Other hyperlipidemia: Secondary | ICD-10-CM | POA: Diagnosis not present

## 2020-07-16 DIAGNOSIS — R7309 Other abnormal glucose: Secondary | ICD-10-CM | POA: Diagnosis not present

## 2020-07-16 DIAGNOSIS — Z0000 Encounter for general adult medical examination without abnormal findings: Secondary | ICD-10-CM | POA: Diagnosis not present

## 2020-07-21 ENCOUNTER — Encounter: Payer: Self-pay | Admitting: Internal Medicine

## 2020-07-21 DIAGNOSIS — M5136 Other intervertebral disc degeneration, lumbar region: Secondary | ICD-10-CM | POA: Diagnosis not present

## 2020-07-21 DIAGNOSIS — M47816 Spondylosis without myelopathy or radiculopathy, lumbar region: Secondary | ICD-10-CM | POA: Diagnosis not present

## 2020-07-21 DIAGNOSIS — M7918 Myalgia, other site: Secondary | ICD-10-CM | POA: Diagnosis not present

## 2020-07-26 DIAGNOSIS — G894 Chronic pain syndrome: Secondary | ICD-10-CM | POA: Diagnosis not present

## 2020-07-26 DIAGNOSIS — E785 Hyperlipidemia, unspecified: Secondary | ICD-10-CM | POA: Diagnosis not present

## 2020-07-26 DIAGNOSIS — G43909 Migraine, unspecified, not intractable, without status migrainosus: Secondary | ICD-10-CM | POA: Diagnosis not present

## 2020-07-26 DIAGNOSIS — I480 Paroxysmal atrial fibrillation: Secondary | ICD-10-CM | POA: Diagnosis not present

## 2020-07-26 DIAGNOSIS — N401 Enlarged prostate with lower urinary tract symptoms: Secondary | ICD-10-CM | POA: Diagnosis not present

## 2020-07-26 DIAGNOSIS — G4733 Obstructive sleep apnea (adult) (pediatric): Secondary | ICD-10-CM | POA: Diagnosis not present

## 2020-07-26 DIAGNOSIS — I2510 Atherosclerotic heart disease of native coronary artery without angina pectoris: Secondary | ICD-10-CM | POA: Diagnosis not present

## 2020-07-26 DIAGNOSIS — Z0000 Encounter for general adult medical examination without abnormal findings: Secondary | ICD-10-CM | POA: Diagnosis not present

## 2020-07-26 DIAGNOSIS — M48061 Spinal stenosis, lumbar region without neurogenic claudication: Secondary | ICD-10-CM | POA: Diagnosis not present

## 2020-07-26 DIAGNOSIS — G309 Alzheimer's disease, unspecified: Secondary | ICD-10-CM | POA: Diagnosis not present

## 2020-07-26 DIAGNOSIS — R82998 Other abnormal findings in urine: Secondary | ICD-10-CM | POA: Diagnosis not present

## 2020-07-27 DIAGNOSIS — R69 Illness, unspecified: Secondary | ICD-10-CM | POA: Diagnosis not present

## 2020-07-29 DIAGNOSIS — Z1212 Encounter for screening for malignant neoplasm of rectum: Secondary | ICD-10-CM | POA: Diagnosis not present

## 2020-08-09 ENCOUNTER — Encounter: Payer: Medicare HMO | Attending: Psychology | Admitting: Psychology

## 2020-08-09 ENCOUNTER — Other Ambulatory Visit: Payer: Self-pay

## 2020-08-09 DIAGNOSIS — M48061 Spinal stenosis, lumbar region without neurogenic claudication: Secondary | ICD-10-CM

## 2020-08-09 DIAGNOSIS — G301 Alzheimer's disease with late onset: Secondary | ICD-10-CM

## 2020-08-09 DIAGNOSIS — R413 Other amnesia: Secondary | ICD-10-CM | POA: Diagnosis not present

## 2020-08-09 DIAGNOSIS — G894 Chronic pain syndrome: Secondary | ICD-10-CM

## 2020-08-09 DIAGNOSIS — F0280 Dementia in other diseases classified elsewhere without behavioral disturbance: Secondary | ICD-10-CM

## 2020-08-09 DIAGNOSIS — Z820 Family history of epilepsy and other diseases of the nervous system: Secondary | ICD-10-CM

## 2020-08-10 ENCOUNTER — Encounter: Payer: Self-pay | Admitting: Psychology

## 2020-08-10 NOTE — Progress Notes (Signed)
08/09/2020: Today's visit was an in person visit that was conducted in my outpatient clinic office.  The patient, his wife and myself were present for this visit that was 1 hour long.  Today I provided feedback regarding the results of the recent neuropsychological evaluation that we conducted here in the office.  We reviewed the results and impressions/diagnosis and went over recommendations going forward.  The patient was able to fully comprehend this feedback session and the results and we reviewed the implications of this information with the patient and his wife.  Specific recommendations were reviewed as well.  The complete evaluation can be found in the patient's EMR dated 06/30/19 21.  Below I will include the summary and impressions from that formal neuropsychological evaluation for convenience.  The patient is scheduled to return in 9 months for follow-up testing to provide more clarity to our initial diagnostic considerations.   Summary of Results:                        The results of the objective neuropsychological measures administered do show significant and severe memory deficits.  The patient displays both auditory and visual memory deficits that are significant and these are identified in both immediate and delayed memory functions.  The patient has adequate and well maintained auditory and visual encoding abilities.  The patient also shows severe deficits with regard to cued/recognition formats of memory functions.  This pattern strongly suggest that memory deficits are global in nature and have to do primarily with deficits for storage and organization of information and not simply deficits for attention and/or retrieval of actually learned information.  As far as other cognitive functioning the patient performed adequately on measures of visual reasoning and problem solving and did very well on measures of visual analysis and organization.  The patient also performed adequately on measures of  pure information processing speed without significant visual complexity.  However, the patient showed poor verbal reasoning and problem-solving, reduced lexicon/vocabulary availability and reductions in his overall general fund of information.  The patient showed significant deficits on measures of visual estimation and judgment as well as difficulty with visual processing speed with more complex visual information.    Impression/Diagnosis:                     Overall, the results of the current neuropsychological evaluation are consistent with patterns typically seen with cortically based progressive dementia.  Taking into account subjective symptom reports by the patient and family members describing progressive reduction in overall cognitive and memory functions over the past several years, past medical history and neuroimaging functioning as well as the current objective cognitive and neuropsychological testing performance, the pattern would be generally consistent with those typically found with the diagnosis of late onset dementia of the Alzheimer's type.  The patient does not show any focal areas of cortical atrophy or indications of prior cerebrovascular events.  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  However, the pattern of overall information processing speed measures as well as the pattern of strengths and weaknesses on a broad range of cognitive domains do not suggest that the patient's deficits are simply related to typical cerebrovascular changes with age.  The patient has no reports of tremor or hallucination/delusions but does have a family history of Alzheimer's.  The patient also has  a history of significant chronic pain but the level of memory deficits and other cognitive deficits in the pattern of these deficits could not be explained simply  because of significant chronic pain or simply attributed to microvascular ischemic changes.  As far as recommendations, it will be important to continue to address and treat his significant back, knee and other chronic pain conditions and symptoms.  The patient may also benefit from a trial of medications addressing Aceta choline functioning as they may provide some acute benefits as far as memory and cognition.  The patient is compliant with his BiPAP device and it will be very important for him to continue to remain compliant with his BiPAP.  Good nutritional aspects and monitoring nutrients such as vitamin D and B12 will also be important going forward and the patient should remain as physically active as possible given the limitations he experiences due to pain.  The patient should also be set up for repeat neuropsychological testing in approximately 9 months to assess for any progressive changes and continue to provide effective and useful ongoing recommendations.  I will provide feedback regarding the results of the current neuropsychological evaluation to the patient and his family.  A copy of this form report will also be available to his neurologist and PCP for their review.  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 Neuropsychologist

## 2020-08-17 DIAGNOSIS — Z85828 Personal history of other malignant neoplasm of skin: Secondary | ICD-10-CM | POA: Diagnosis not present

## 2020-08-17 DIAGNOSIS — L570 Actinic keratosis: Secondary | ICD-10-CM | POA: Diagnosis not present

## 2020-08-17 DIAGNOSIS — D485 Neoplasm of uncertain behavior of skin: Secondary | ICD-10-CM | POA: Diagnosis not present

## 2020-08-17 DIAGNOSIS — L821 Other seborrheic keratosis: Secondary | ICD-10-CM | POA: Diagnosis not present

## 2020-08-17 DIAGNOSIS — C44529 Squamous cell carcinoma of skin of other part of trunk: Secondary | ICD-10-CM | POA: Diagnosis not present

## 2020-08-19 ENCOUNTER — Ambulatory Visit: Payer: Medicare HMO | Admitting: Psychology

## 2020-08-24 DIAGNOSIS — G629 Polyneuropathy, unspecified: Secondary | ICD-10-CM | POA: Diagnosis not present

## 2020-08-24 DIAGNOSIS — M545 Low back pain: Secondary | ICD-10-CM | POA: Diagnosis not present

## 2020-08-24 DIAGNOSIS — G8929 Other chronic pain: Secondary | ICD-10-CM | POA: Diagnosis not present

## 2020-08-24 DIAGNOSIS — I4891 Unspecified atrial fibrillation: Secondary | ICD-10-CM | POA: Diagnosis not present

## 2020-08-24 DIAGNOSIS — M1990 Unspecified osteoarthritis, unspecified site: Secondary | ICD-10-CM | POA: Diagnosis not present

## 2020-08-24 DIAGNOSIS — E785 Hyperlipidemia, unspecified: Secondary | ICD-10-CM | POA: Diagnosis not present

## 2020-08-24 DIAGNOSIS — Z79891 Long term (current) use of opiate analgesic: Secondary | ICD-10-CM | POA: Diagnosis not present

## 2020-08-24 DIAGNOSIS — Z7901 Long term (current) use of anticoagulants: Secondary | ICD-10-CM | POA: Diagnosis not present

## 2020-08-24 DIAGNOSIS — I10 Essential (primary) hypertension: Secondary | ICD-10-CM | POA: Diagnosis not present

## 2020-08-24 DIAGNOSIS — D6869 Other thrombophilia: Secondary | ICD-10-CM | POA: Diagnosis not present

## 2020-09-22 ENCOUNTER — Encounter: Payer: Self-pay | Admitting: Internal Medicine

## 2020-09-22 ENCOUNTER — Ambulatory Visit: Payer: Medicare HMO | Admitting: Internal Medicine

## 2020-09-22 VITALS — BP 112/70 | HR 63 | Ht 70.0 in | Wt 162.0 lb

## 2020-09-22 DIAGNOSIS — Z7901 Long term (current) use of anticoagulants: Secondary | ICD-10-CM

## 2020-09-22 DIAGNOSIS — Z1211 Encounter for screening for malignant neoplasm of colon: Secondary | ICD-10-CM | POA: Diagnosis not present

## 2020-09-22 MED ORDER — SUTAB 1479-225-188 MG PO TABS: 1.0000 | ORAL_TABLET | Freq: Once | ORAL | 0 refills | Status: AC

## 2020-09-22 NOTE — Progress Notes (Signed)
HISTORY OF PRESENT ILLNESS:  Alan Knight. is a 74 y.o. male with multiple medical problems as listed below including a history of paroxysmal atrial fibrillation for which he is on chronic Eliquis therapy.  He presents today, with his wife, regarding repeat screening colonoscopy.  Patient underwent complete colonoscopy for routine screening June 2011.  He does have family history of colon cancer in an uncle.  The examination revealed moderate sigmoid diverticulosis as well as a few right-sided diverticula.  Small internal hemorrhoids.  The exam was otherwise normal.  Routine follow-up in 10 years recommended.  Patient's please report that he has no active GI symptoms.  No change in bowel habits.  No bleeding.  No abdominal pain.  He has been on Eliquis for several years.  He has a standing order from his cardiologist [facility] that he may go off of his Eliquis for 4 days prior to any interventions.  He does this regularly as he requires regular epidural injections for his back as well as nerve ablation therapy.  Review of outside blood work from March 2021 reveals a normal hemoglobin of 14.7.  He has completed his Covid vaccination series.  REVIEW OF SYSTEMS:  All non-GI ROS negative otherwise stated in the HPI except for back pain  Past Medical History:  Diagnosis Date   Arthritis    Basal cell carcinoma    BPH (benign prostatic hypertrophy) with urinary obstruction    Chest tightness 02/21/2017   Chronic bilateral low back pain without sciatica 05/17/2017   Chronic pain of both knees 10/03/2017   Chronic pain syndrome 10/03/2017   Cough 02/21/2017   Diverticulosis    GERD (gastroesophageal reflux disease)    Headache    Hyperlipidemia    Hypertension    Hypogonadism male    Internal hemorrhoids    Migraine    OA (osteoarthritis)    OSA treated with BiPAP 11/13/2018   moderate obstructive sleep apnea with an AHI of 19.9/h with no central sleep apnea.  Oxygen  desaturations were as low as 89%.  Now on BIPAP at 18/14cm H2O.   Osteoarthritis 02/21/2017   Paroxysmal atrial fibrillation ([facility])    Primary osteoarthritis of left knee 11/09/2015   S/P total knee replacement using cement 11/09/2015   Spondylosis without myelopathy or radiculopathy, lumbar region 05/17/2017   Status post left knee replacement 10/03/2017   Overview:  Added automatically from request for surgery [id number]    Past Surgical History:  Procedure Laterality Date   ATRIAL FIBRILLATION ABLATION N/A 11/16/2017   Procedure: ATRIAL FIBRILLATION ABLATION;  Surgeon: Thompson Grayer, MD;  Location: [facility];  Service: Cardiovascular;  Laterality: N/A;   CARPAL TUNNEL RELEASE     left   genicular nerve ablation     HIP ARTHROPLASTY     JOINT REPLACEMENT     KNEE ARTHROPLASTY     right   KNEE SURGERY     lumbar nerve ablation     SHOULDER SURGERY     bilateral   TOTAL KNEE ARTHROPLASTY     left   TOTAL KNEE ARTHROPLASTY Left 11/09/2015   Procedure: TOTAL KNEE ARTHROPLASTY;  Surgeon: Garald Balding, MD;  Location: [facility];  Service: Orthopedics;  Laterality: Left;    Social History Yong Grieser.  reports that he has never smoked. He has never used smokeless tobacco. He reports previous alcohol use. He reports that he does not use drugs.  family history includes CVA in his brother;  Colon cancer in an other family member; Heart attack in his father; Memory loss in his mother.  No Known Allergies     PHYSICAL EXAMINATION: Vital signs: BP 112/70 (BP Location: Left Arm, Patient Position: Sitting, Cuff Size: Normal)    Pulse 63    Ht 5\' 10"  (1.778 m)    Wt 162 lb (73.5 kg)    BMI 23.24 kg/m   Constitutional: generally well-appearing, no acute distress Psychiatric: alert and oriented x3, cooperative Eyes: extraocular movements intact, anicteric, conjunctiva pink Mouth: oral pharynx moist, no lesions Neck: supple no lymphadenopathy Cardiovascular:  heart regular rate and rhythm, no murmur Lungs: clear to auscultation bilaterally Abdomen: soft, nontender, nondistended, no obvious ascites, no peritoneal signs, normal bowel sounds, no organomegaly Rectal: Deferred until colonoscopy Extremities: no clubbing, cyanosis, or lower extremity edema bilaterally Skin: no lesions on visible extremities Neuro: No focal deficits.  Cranial nerves intact  ASSESSMENT:  1.  Colon cancer screening.  Negative index exam 2011.  Appropriate candidate without contraindication 2.  History of paroxysmal atrial fibrillation on Eliquis.  Currently in sinus rhythm. 3.  Chronic anticoagulation.  Patient does come off of his anticoagulants as needed for frequent interventions as described.  This has been endorsed by his cardiologist   PLAN:  1.  Screening colonoscopy.  The patient is HIGH RISK given his comorbidities and the need to address anticoagulation therapy.The nature of the procedure, as well as the risks, benefits, and alternatives were carefully and thoroughly reviewed with the patient. Ample time for discussion and questions allowed. The patient understood, was satisfied, and agreed to proceed. 2.  Hold Eliquis 4 days prior to the procedure as he does routinely for other interventions.  Will anticipate reinitiating therapy immediately post procedure less significant pathology encountered that requires extensive therapeutic intervention.  I explained to the patient and his wife the risks and benefits of interrupting anticoagulation therapy.  They are quite aware.

## 2020-09-22 NOTE — Patient Instructions (Signed)
You have been scheduled for a colonoscopy. Please follow written instructions given to you at your visit today.  Please pick up your prep supplies at the pharmacy within the next 1-3 days. If you use inhalers (even only as needed), please bring them with you on the day of your procedure.   

## 2020-09-25 DIAGNOSIS — Z23 Encounter for immunization: Secondary | ICD-10-CM | POA: Diagnosis not present

## 2020-09-27 ENCOUNTER — Telehealth: Payer: Self-pay | Admitting: Internal Medicine

## 2020-09-27 NOTE — Telephone Encounter (Signed)
New Message  Pts wife is calling to let Dr Rayann Heman know they received a letter that it was time to schedule an appt. She says the pt is with the [facility] now and they have him seeing Dr Marvel Plan through [facility].  She says they appreciate everything Dr Rayann Heman did for them

## 2020-10-21 ENCOUNTER — Encounter: Payer: Self-pay | Admitting: Neurology

## 2020-10-21 ENCOUNTER — Ambulatory Visit (INDEPENDENT_AMBULATORY_CARE_PROVIDER_SITE_OTHER): Payer: Medicare HMO | Admitting: Neurology

## 2020-10-21 ENCOUNTER — Other Ambulatory Visit: Payer: Self-pay

## 2020-10-21 VITALS — BP 93/57 | HR 71 | Ht 70.0 in | Wt 158.2 lb

## 2020-10-21 DIAGNOSIS — F0280 Dementia in other diseases classified elsewhere without behavioral disturbance: Secondary | ICD-10-CM | POA: Diagnosis not present

## 2020-10-21 DIAGNOSIS — R69 Illness, unspecified: Secondary | ICD-10-CM | POA: Diagnosis not present

## 2020-10-21 DIAGNOSIS — G301 Alzheimer's disease with late onset: Secondary | ICD-10-CM | POA: Diagnosis not present

## 2020-10-21 MED ORDER — DONEPEZIL HCL 10 MG PO TABS: 10.0000 mg | ORAL_TABLET | Freq: Every day | ORAL | 3 refills | Status: DC

## 2020-10-21 MED ORDER — DONEPEZIL HCL 10 MG PO TABS: 10.0000 mg | ORAL_TABLET | Freq: Every day | ORAL | 3 refills | Status: AC

## 2020-10-21 NOTE — Progress Notes (Signed)
[facility]    Provider:  Dr Jaynee Eagles Requesting Provider: Reynold Bowen, MD Primary Care Provider:  Reynold Bowen, MD  CC:  Memory loss  Interval History: Diagnosed with diagnosis of late onset dementia of the Alzheimer's type. They are moving to [place] run at [facility] Mostly houses and have assisted living and memory unit. We discussed POA and HCPOA. Talked about wills, discussed at length the diagnosis, progression, answered all questions, provided information, local resources.   Patient complains of symptoms per HPI as well as the following symptoms:dementia . Pertinent negatives and positives per HPI. All others negative   HPI:  Alan Knight. is a 74 y.o. male here as requested by Reynold Bowen, MD for memory impairment. PMHx obstructive sleep apnea treated with BiPAP, prediabetes, arthritis and chronic low back pain, paroxysmal A. fib status post ablation and off antiarrhythmic therapy, hypertension, hyperlipidemia, headache, chronic pain due to osteoarthritis.  I reviewed Dr. Lorain Childes notes: Patient does not report significant alcohol use, monthly or less, he has never smoked, he exercises 2 times a week.  He has good CPAP compliance.  His headaches are well managed, no current meds, no falls no assistive devices, no A. fib episodes, he does report some memory issues which have been persistent.  He reports some mild worsening, he had hallucinations with Valtrex which he took for shingles, he was sent to neurology to evaluate these changes.  They noticed slight memory changes for about 5-7 years since retiring, worsening recently in the setting os pain. Noticed some short-term memory loss in the beginning.  He has been athletic all his life, within the last year his back "went crazy" he has been taking tramadol. About 1.5 years ago he went into chronic pain. The pain is affecting his memory. Here with his wife who also provides much information. He has also  been on tramadol 2-3x a day.Memory worsening this past 1.5 years. Patient thinks he is having more memory changes. He is having more problems multi-tasking, he can't complete a list of items wife asks him to do. He is repeating himself and asking the same questions. Wife pays bills but not because patient made any mistakes. But wife doesn't think he could be reliable to take care of the bills. He also has some obsessive-compulsive traits. Successful with medications himself but wife says he can get confused if there is a change of medication and she has to check up on him. No problems driving but she has started driving more. Mother had dementia. He denies concussions.   Reviewed notes, labs and imaging from outside physicians, which showed:  I reviewed MRI of the brain 03/31/2015 images and agree with the following: 1. No acute intracranial process identified. 2. Generalized cerebral atrophy with mild patchy T2/FLAIR hyperintensities within the periventricular and deep white matter of both cerebral hemispheres. While these foci are nonspecific, these are most likely related to underlying chronic small vessel ischemic changes, mild for patient age.  Review of Systems: Patient complains of symptoms per HPI as well as the following symptoms: memory loss. Pertinent negatives and positives per HPI. All others negative.   Social History   Socioeconomic History  . Marital status: Married    Spouse name: Jon Gills  . Number of children: Not on file  . Years of education: Not on file  . Highest education level: High school graduate  Occupational History  . Occupation: Sales  Tobacco Use  . Smoking status: Never Smoker  . Smokeless  tobacco: Never Used  Vaping Use  . Vaping Use: Never used  Substance and Sexual Activity  . Alcohol use: Not Currently    Comment: 1 mixed drink per day  . Drug use: No  . Sexual activity: Not on file  Other Topics Concern  . Not on file  Social History Narrative    Lives in [place] with wife   Retired   Right handed   Caffeine: maybe 2 cups a month   Social Determinants of Health   Financial Resource Strain:   . Difficulty of Paying Living Expenses: Not on file  Food Insecurity:   . Worried About Charity fundraiser in the Last Year: Not on file  . Ran Out of Food in the Last Year: Not on file  Transportation Needs:   . Lack of Transportation (Medical): Not on file  . Lack of Transportation (Non-Medical): Not on file  Physical Activity:   . Days of Exercise per Week: Not on file  . Minutes of Exercise per Session: Not on file  Stress:   . Feeling of Stress : Not on file  Social Connections:   . Frequency of Communication with Friends and Family: Not on file  . Frequency of Social Gatherings with Friends and Family: Not on file  . Attends Religious Services: Not on file  . Active Member of Clubs or Organizations: Not on file  . Attends Archivist Meetings: Not on file  . Marital Status: Not on file  Intimate Partner Violence:   . Fear of Current or Ex-Partner: Not on file  . Emotionally Abused: Not on file  . Physically Abused: Not on file  . Sexually Abused: Not on file    Family History  Problem Relation Age of Onset  . Heart attack Father   . CVA Brother   . Memory loss Mother        was in memory care for 15+ years  . Colon cancer Other        uncle according to 2011 colon report    Past Medical History:  Diagnosis Date  . Arthritis   . Basal cell carcinoma   . BPH (benign prostatic hypertrophy) with urinary obstruction   . Chest tightness 02/21/2017  . Chronic bilateral low back pain without sciatica 05/17/2017  . Chronic pain of both knees 10/03/2017  . Chronic pain syndrome 10/03/2017  . Cough 02/21/2017  . Diverticulosis   . GERD (gastroesophageal reflux disease)   . Headache   . Hyperlipidemia   . Hypertension   . Hypogonadism male   . Internal hemorrhoids   . Migraine   . OA (osteoarthritis)   .  OSA treated with BiPAP 11/13/2018   moderate obstructive sleep apnea with an AHI of 19.9/h with no central sleep apnea.  Oxygen desaturations were as low as 89%.  Now on BIPAP at 18/14cm H2O.  . Osteoarthritis 02/21/2017  . Paroxysmal atrial fibrillation (HCC)   . Primary osteoarthritis of left knee 11/09/2015  . S/P total knee replacement using cement 11/09/2015  . Spondylosis without myelopathy or radiculopathy, lumbar region 05/17/2017  . Status post left knee replacement 10/03/2017   Overview:  Added automatically from request for surgery [id number]    Patient Active Problem List   Diagnosis Date Noted  . Late onset Alzheimer's dementia without behavioral disturbance ([place]) 10/24/2020  . OSA treated with BiPAP 11/13/2018  . Chronic pain of both knees 10/03/2017  . Chronic pain syndrome 10/03/2017  . Status post  left knee replacement 10/03/2017  . Spondylosis without myelopathy or radiculopathy, lumbar region 05/17/2017  . Chronic bilateral low back pain without sciatica 05/17/2017  . Chest tightness 02/21/2017  . Cough 02/21/2017  . Osteoarthritis 02/21/2017  . Primary osteoarthritis of left knee 11/09/2015  . S/P total knee replacement using cement 11/09/2015    Past Surgical History:  Procedure Laterality Date  . ATRIAL FIBRILLATION ABLATION N/A 11/16/2017   Procedure: ATRIAL FIBRILLATION ABLATION;  Surgeon: Thompson Grayer, MD;  Location: [facility];  Service: Cardiovascular;  Laterality: N/A;  . CARPAL TUNNEL RELEASE     left  . genicular nerve ablation    . HIP ARTHROPLASTY    . JOINT REPLACEMENT    . KNEE ARTHROPLASTY     right  . KNEE SURGERY    . lumbar nerve ablation    . SHOULDER SURGERY     bilateral  . TOTAL KNEE ARTHROPLASTY     left  . TOTAL KNEE ARTHROPLASTY Left 11/09/2015   Procedure: TOTAL KNEE ARTHROPLASTY;  Surgeon: Garald Balding, MD;  Location: [facility];  Service: Orthopedics;  Laterality: Left;    Current Outpatient Medications  Medication  Sig Dispense Refill  . acetaminophen (TYLENOL) 325 MG tablet Take 650 mg every 6 (six) hours as needed by mouth for moderate pain or headache.    . Ascorbic Acid (VITAMIN C) 1000 MG tablet Take 1,000 mg by mouth daily.    . calcium acetate (PHOSLO) 667 MG capsule Take 500 mg by mouth daily.    . Cholecalciferol (VITAMIN D3) 5000 UNITS CAPS Take 5,000 Units daily by mouth.     . co-enzyme Q-10 30 MG capsule Take 300 mg by mouth daily.    . DULoxetine (CYMBALTA) 60 MG capsule Take 1 capsule (60 mg total) by mouth daily. 30 capsule 3  . ELIQUIS 5 MG TABS tablet TAKE 1 TABLET BY MOUTH TWICE DAILY 180 tablet 1  . ezetimibe (ZETIA) 10 MG tablet Take 10 mg by mouth daily.    Marland Kitchen gabapentin (NEURONTIN) 300 MG capsule Take 1 capsule by mouth daily.    Marland Kitchen lisinopril (PRINIVIL,ZESTRIL) 5 MG tablet Take 10 mg by mouth daily.     . Multiple Minerals-Vitamins (CAL-MAG-ZINC-D) TABS Take 1 tablet daily by mouth.    . Omega-3 1000 MG CAPS Take 1 capsule by mouth daily.    . Probiotic Product ([place]) Take by mouth.    . simvastatin (ZOCOR) 40 MG tablet Take 40 mg by mouth daily.    . traMADol (ULTRAM) 50 MG tablet TAKE 1 TABLET BY MOUTH EVERY 12 HOURS AS NEEDED 30 tablet 0  . VITAMIN B COMPLEX-C PO Take by mouth.    . donepezil (ARICEPT) 10 MG tablet Take 1 tablet (10 mg total) by mouth at bedtime. 90 tablet 3   No current facility-administered medications for this visit.    Allergies as of 10/21/2020  . (No Known Allergies)    Vitals: BP (!) 93/57   Pulse 71   Ht 5\' 10"  (1.778 m)   Wt 158 lb 3.2 oz (71.8 kg)   BMI 22.70 kg/m  Last Weight:  Wt Readings from Last 1 Encounters:  10/21/20 158 lb 3.2 oz (71.8 kg)   Last Height:   Ht Readings from Last 1 Encounters:  10/21/20 5\' 10"  (1.778 m)     Physical exam: Exam: Gen: NAD, conversant, well nourised, well groomed  CV: RRR, no MRG. No Carotid Bruits. No peripheral edema, warm, nontender Eyes: Conjunctivae  clear without exudates or hemorrhage  Neuro: Detailed Neurologic Exam  Speech:    Speech is normal; fluent and spontaneous with normal comprehension.  Cognition:    The patient is oriented to person, place, and time;     recent and remote memory intact;     language fluent;     normal attention, concentration,     fund of knowledge Cranial Nerves:    The pupils are equal, round, and reactive to light. The fundi areflat. Visual fields are full to finger confrontation. Extraocular movements are intact. Trigeminal sensation is intact and the muscles of mastication are normal. The face is symmetric. The palate elevates in the midline. Hearing intact. Voice is normal. Shoulder shrug is normal. The tongue has normal motion without fasciculations.   Coordination:    Normal finger to nose   Gait:    Normal native gait  Motor Observation:    No asymmetry, no atrophy, and no involuntary movements noted. Tone:    Normal muscle tone.    Posture:    Posture is normal. normal erect    Strength:    Strength is V/V in the upper and lower limbs.      Sensation: intact to LT     Reflex Exam:  DTR's:    Deep tendon reflexes in the upper and lower extremities are normal bilaterally.   Toes:    The toes are downgoing bilaterally.   Clonus:    Clonus is absent.    Assessment/Plan:  73 y.o. male here as requested by Reynold Bowen, MD for memory impairment. PMHx obstructive sleep apnea treated with BiPAP, prediabetes, arthritis and chronic low back pain, paroxysmal A. fib status post ablation and off antiarrhythmic therapy, hypertension, hyperlipidemia, headache, chronic pain due to osteoarthritis. MMSE 28/30. Mother had Alzheimer's. Diagnosed with late onset Alzheiemr's disease.  Diagnosed with diagnosis of late onset dementia of the Alzheimer's type. They are moving to [place] run at [facility]. Mostly houses and have assisted living and memory unit. We discussed POA and HCPOA. Talked  about wills, discussed at length the diagnosis, progression, answered all questions, provided information, local resources.   Aricept: Start with 1/2 tablet and then full tablet if no side effects Namenda(Memantine) when stable on Aricept - email   No orders of the defined types were placed in this encounter.  Meds ordered this encounter  Medications  . DISCONTD: donepezil (ARICEPT) 10 MG tablet    Sig: Take 1 tablet (10 mg total) by mouth at bedtime.    Dispense:  90 tablet    Refill:  3  . donepezil (ARICEPT) 10 MG tablet    Sig: Take 1 tablet (10 mg total) by mouth at bedtime.    Dispense:  90 tablet    Refill:  3    Cc: Reynold Bowen, MD  Sarina Ill, MD  [facility] [address] [place], [place] [address]  Phone [phone] Fax [phone]  I spent over 85 minutes minutes of face-to-face and non-face-to-face time with patient on the  1. Late onset Alzheimer's dementia without behavioral disturbance (HCC)    diagnosis.  This included previsit chart review, lab review, study review, order entry, electronic health record documentation, patient education on the different diagnostic and therapeutic options, counseling and coordination of care, risks and benefits of management, compliance, or risk factor reduction

## 2020-10-21 NOTE — Patient Instructions (Addendum)
Aricept: Start with 1/2 tablet and then  Namenda(Memantine) when stable on Aricept   Alzheimer Disease Caregiver Guide  Alzheimer disease causes a person to lose the ability to remember things and make decisions. A person who has Alzheimer disease may not be able to take care of himself or herself. He or she may need help with simple tasks. The tips below can help you care for the person. What kind of changes does this condition cause? This condition makes a person:  Forget things.  Feel confused.  Act differently.  Have different moods. These things get worse with time. Tips to help with symptoms  Be calm and patient.  Respond with a simple, short answer.  Avoid correcting the person in a negative way.  Try not to take things personally, even if the person forgets your name.  Do not argue with the person. This may make the person more upset. Tips to lessen frustration  Make appointments and do daily tasks when the person is at his or her best.  Take your time. Simple tasks may take longer. Allow plenty of time to complete tasks.  Limit choices for the person.  Involve the person in what you are doing.  Keep a daily routine.  Avoid new or crowded places, if possible.  Use simple words, short sentences, and a calm voice. Only give one direction at a time.  Buy clothes and shoes that are easy to put on and take off.  Organize medicines in a pillbox for each day of the week.  Keep a calendar in a central location to remind the person of meetings or other activities.  Let people help if they offer. Take a break when needed. Tips to prevent injury  Keep floors clear. Remove rugs, magazine racks, and floor lamps.  Keep hallways well-lit.  Put a handrail and non-slip mat in the bathtub or shower.  Put childproof locks on cabinets that have dangerous items in them. These items include medicine, alcohol, guns, toxic cleaning items, sharp tools, matches, and  lighters.  Put locks on doors where the person cannot see or reach them. This helps the person to not wander out of the house and get lost.  Be prepared for emergencies. Keep a list of emergency phone numbers and addresses close by.  Bracelets may be worn that track location and identify the person as having memory problems. This should be worn at all times for safety. Tips for the future  Discuss financial and legal planning early. People with this disease have trouble managing their money as the disease gets worse. Get help from a professional.  Talk about advance directives, safety, and daily care. Take these steps: ? Create a living will and choose a power of attorney. This is someone who can make decisions for the person with Alzheimer disease when he or she can no longer do so. ? Discuss driving safety and when to stop driving. The person's doctor can help with this. ? If the person lives alone, make sure he or she is safe. Some people need extra help at home. Other people need more care at a nursing home or care center. Where to find support You can find support by joining a support group near you. Some benefits of joining a support group include:  Learning ways to manage stress.  Sharing experiences with others.  Getting emotional comfort and support.  Learning about caregiving as the disease progresses.  Knowing what community resources are available and making use  of them. Where to find more information  Alzheimer's Association: [employer] Contact a doctor if:  The person has a fever.  The person has a sudden behavior change that does not get better with calming strategies.  The person is not able to take care of himself or herself at home.  The person threatens you or anyone else, including himself or herself.  You are no longer able to care for the person. Summary  Alzheimer disease causes a person to forget things and to be confused.  A person who has this  condition may not be able to take care of himself or herself.  Take steps to keep the person from getting hurt. Plan for future care.  You can find support by joining a support group near you. This information is not intended to replace advice given to you by your health care provider. Make sure you discuss any questions you have with your health care provider. Document Revised: 04/01/2019 Document Reviewed: 12/06/2017 Elsevier Patient Education  [place].  Alzheimer's Disease Alzheimer's disease is a brain disease that affects memory, thinking, language, and behavior. People with Alzheimer's disease lose mental abilities, and the disease gets worse over time. Alzheimer's disease is a form of dementia. What are the causes? This condition develops when a protein called beta-amyloid forms deposits in the brain. It is not known what causes these deposits to form. Alzheimer's disease may also be caused by a gene mutation that is inherited from one parent or both parents. A gene mutation is a harmful change in a gene. Not everyone who inherits the genetic mutation will get the disease. What increases the risk? You are more likely to develop this condition if you:  Are older than age 62.  Have a family history of dementia.  Have had a brain injury.  Have heart or blood vessel disease.  Have had a stroke.  Have high blood pressure or high cholesterol.  Have diabetes. What are the signs or symptoms? Symptoms of this condition may happen in three stages, which often overlap. Early stage In this stage, you may continue to be independent. You may still be able to drive, work, and be social. Symptoms in this stage include:  Minor memory problems, such as forgetting a name or what you read.  Difficulty with: ? Paying attention. ? Communicating. ? Doing familiar tasks. ? Problem solving or doing calculations. ? Following instructions. ? Learning new  things.  Anxiety.  Social withdrawal.  Loss of motivation. Moderate stage In this stage, you will start to need care. Symptoms in this stage include:  Difficulty with expressing thoughts.  Memory loss that affects daily life. This can include forgetting: ? Your address or phone number. ? Recent events that have happened. ? Parts of your personal history, such as where you went to school.  Confusion about where you are or what time it is.  Difficulty in judging distance.  Changes in personality, mood, and behavior. You may be moody, irritable, angry, frustrated, fearful, anxious, or suspicious.  Poor reasoning and judgment.  Delusions or hallucinations.  Changes in sleep patterns.  Wandering and getting lost, even in familiar places. Severe stage In the final stage, you will need help with your personal care and daily activities. Symptoms in this stage include:  Worsening memory loss.  Personality changes.  Loss of awareness of your surroundings.  Changes in physical abilities, including the ability to walk, sit, and swallow.  Difficulty in communicating.  Inability to control  your bladder and bowels.  Increasing confusion.  Increasing behavior changes. How is this diagnosed? This condition is diagnosed by a health care provider who specializes in diseases of the nervous system (neurologist). Other causes of dementia may also be ruled out. Your health care provider will talk with you and your family, friends, or caregivers about your history and symptoms. A thorough medical history will be taken, and you will have a physical exam and tests. Tests may include:  Lab tests, such as blood or urine tests.  Imaging tests, such as a CT scan, a PET scan, or an MRI.  A lumbar puncture. This test involves removing and testing a small amount of the fluid that surrounds the brain and spinal cord.  An electroencephalogram (EEG). In this test, small metal discs are used to  measure electrical activity in the brain.  Memory tests, cognitive tests, and neuropsychological tests. These tests evaluate brain function.  Genetic testing may be done if you have early onset of the disease (before age 48) or if other family members have the disease. How is this treated? At this time, there is no treatment to cure Alzheimer's disease or stop it from getting worse. The goals of treatment are:  To slow down symptoms of the disease, if possible.  To manage behavioral changes.  To provide you with a safe environment.  To help manage daily life for you and your caregivers. The following treatment options are available:  Medicines. Medicines may help to slow down memory loss and manage behavioral symptoms.  Cognitive therapy. Cognitive therapy provides you with education, support, and memory aids. It is most helpful in the early stages of the condition.  Counseling or spiritual guidance. It is normal to have a lot of feelings, including anger, relief, fear, and isolation. Counseling and guidance can help you deal with these feelings.  Caregiving. This involves having caregivers help you with your daily activities.  Family support groups. These provide education, emotional support, and information about community resources to family members who are taking care of you. Follow these instructions at home:  Medicines  Take over-the-counter and prescription medicines only as told by your health care provider.  Use a pill organizer or pill reminder to help you manage your medicines.  Avoid taking medicines that can affect thinking, such as pain medicines or sleeping medicines. Lifestyle  Make healthy lifestyle choices: ? Be physically active as told by your health care provider. Regular exercise may help improve symptoms. ? Do not use any products that contain nicotine or tobacco, such as cigarettes, e-cigarettes, and chewing tobacco. If you need help quitting, ask your  health care provider. ? Do not drink alcohol. ? Eat a healthy diet. ? Practice stress-management techniques when you get stressed. ? Stay social.  Drink enough fluid to keep your urine pale yellow.  Make sure to get quality sleep. ? Avoid taking long naps during the day. Take short naps of 30 minutes or less if needed. ? Keep your sleeping area dark and cool. ? Avoid exercising during the few hours before you go to bed. ? Avoid caffeine products in the afternoon and evening. General instructions  Work with your health care provider to determine what you need help with and what your safety needs are.  If you were given a bracelet that identifies you as a person with memory loss or tracks your location, make sure to wear it at all times.  Talk with your health care provider about whether it  is safe for you to drive.  Work with your family to make important decisions, such as advance directives, medical power of attorney, or a living will.  Keep all follow-up visits as told by your health care provider. This is important. Where to find more information  The Alzheimer's Association: Call the 24-hour helpline at 1-619-421-3633, or visit [employer] Contact a health care provider if:  You have nausea, vomiting, or trouble with eating.  You have dizziness or weakness.  You or your family members become concerned for your safety. Get help right away if:  You feel depressed or sad, or feel that you want to harm yourself.  You develop chest pain or difficulty with breathing.  You pass out. If you ever feel like you may hurt yourself or others, or have thoughts about taking your own life, get help right away. You can go to your nearest emergency department or call:  Your local emergency services (911 in the [place]  A suicide crisis helpline, such as the [facility] at 603-286-5267. This is open 24 hours a day. Summary  Alzheimer's disease is a brain  disease that affects memory, thinking, language, and behavior. Alzheimer's disease is a form of dementia.  This condition is diagnosed by a specialist in diseases of the nervous system (neurologist).  At this time, there is no treatment to cure Alzheimer's disease or stop it from getting worse. The goals of treatment are to slow memory loss and help you manage any symptoms.  Work with your family to make important decisions, such as advance directives, medical power of attorney, or a living will. This information is not intended to replace advice given to you by your health care provider. Make sure you discuss any questions you have with your health care provider. Document Revised: 11/19/2018 Document Reviewed: 11/19/2018 Elsevier Patient Education  [place].  Memantine Tablets What is this medicine? MEMANTINE (MEM an teen) is used to treat dementia caused by Alzheimer's disease. This medicine may be used for other purposes; ask your health care provider or pharmacist if you have questions. COMMON BRAND NAME(S): Namenda What should I tell my health care provider before I take this medicine? They need to know if you have any of these conditions:  difficulty passing urine  kidney disease  liver disease  seizures  an unusual or allergic reaction to memantine, other medicines, foods, dyes, or preservatives  pregnant or trying to get pregnant  breast-feeding How should I use this medicine? Take this medicine by mouth with a glass of water. Follow the directions on the prescription label. You may take this medicine with or without food. Take your doses at regular intervals. Do not take your medicine more often than directed. Continue to take your medicine even if you feel better. Do not stop taking except on the advice of your doctor or health care professional. Talk to your pediatrician regarding the use of this medicine in children. Special care may be needed. Overdosage: If  you think you have taken too much of this medicine contact a poison control center or emergency room at once. NOTE: This medicine is only for you. Do not share this medicine with others. What if I miss a dose? If you miss a dose, take it as soon as you can. If it is almost time for your next dose, take only that dose. Do not take double or extra doses. If you do not take your medicine for several days, contact your health  care provider. Your dose may need to be changed. What may interact with this medicine?  acetazolamide  amantadine  cimetidine  dextromethorphan  dofetilide  hydrochlorothiazide  ketamine  metformin  methazolamide  quinidine  ranitidine  sodium bicarbonate  triamterene This list may not describe all possible interactions. Give your health care provider a list of all the medicines, herbs, non-prescription drugs, or dietary supplements you use. Also tell them if you smoke, drink alcohol, or use illegal drugs. Some items may interact with your medicine. What should I watch for while using this medicine? Visit your doctor or health care professional for regular checks on your progress. Check with your doctor or health care professional if there is no improvement in your symptoms or if they get worse. You may get drowsy or dizzy. Do not drive, use machinery, or do anything that needs mental alertness until you know how this drug affects you. Do not stand or sit up quickly, especially if you are an older patient. This reduces the risk of dizzy or fainting spells. Alcohol can make you more drowsy and dizzy. Avoid alcoholic drinks. What side effects may I notice from receiving this medicine? Side effects that you should report to your doctor or health care professional as soon as possible:  allergic reactions like skin rash, itching or hives, swelling of the face, lips, or tongue  agitation or a feeling of restlessness  depressed  mood  dizziness  hallucinations  redness, blistering, peeling or loosening of the skin, including inside the mouth  seizures  vomiting Side effects that usually do not require medical attention (report to your doctor or health care professional if they continue or are bothersome):  constipation  diarrhea  headache  nausea  trouble sleeping This list may not describe all possible side effects. Call your doctor for medical advice about side effects. You may report side effects to FDA at 1-800-FDA-1088. Where should I keep my medicine? Keep out of the reach of children. Store at room temperature between 15 degrees and 30 degrees C (59 degrees and 86 degrees F). Throw away any unused medicine after the expiration date. NOTE: This sheet is a summary. It may not cover all possible information. If you have questions about this medicine, talk to your doctor, pharmacist, or health care provider.  2020 Elsevier/Gold Standard (2013-09-29 14:10:42) Donepezil tablets What is this medicine? DONEPEZIL (doe NEP e zil) is used to treat mild to moderate dementia caused by Alzheimer's disease. This medicine may be used for other purposes; ask your health care provider or pharmacist if you have questions. COMMON BRAND NAME(S): Aricept What should I tell my health care provider before I take this medicine? They need to know if you have any of these conditions:  asthma or other lung disease  difficulty passing urine  head injury  heart disease  history of irregular heartbeat  liver disease  seizures (convulsions)  stomach or intestinal disease, ulcers or stomach bleeding  an unusual or allergic reaction to donepezil, other medicines, foods, dyes, or preservatives  pregnant or trying to get pregnant  breast-feeding How should I use this medicine? Take this medicine by mouth with a glass of water. Follow the directions on the prescription label. You may take this medicine with or  without food. Take this medicine at regular intervals. This medicine is usually taken before bedtime. Do not take it more often than directed. Continue to take your medicine even if you feel better. Do not stop taking except on  your doctor's advice. If you are taking the 23 mg donepezil tablet, swallow it whole; do not cut, crush, or chew it. Talk to your pediatrician regarding the use of this medicine in children. Special care may be needed. Overdosage: If you think you have taken too much of this medicine contact a poison control center or emergency room at once. NOTE: This medicine is only for you. Do not share this medicine with others. What if I miss a dose? If you miss a dose, take it as soon as you can. If it is almost time for your next dose, take only that dose, do not take double or extra doses. What may interact with this medicine? Do not take this medicine with any of the following medications:  certain medicines for fungal infections like itraconazole, fluconazole, posaconazole, and voriconazole  cisapride  dextromethorphan; quinidine  dronedarone  pimozide  quinidine  thioridazine This medicine may also interact with the following medications:  antihistamines for allergy, cough and cold  atropine  bethanechol  carbamazepine  certain medicines for bladder problems like oxybutynin, tolterodine  certain medicines for Parkinson's disease like benztropine, trihexyphenidyl  certain medicines for stomach problems like dicyclomine, hyoscyamine  certain medicines for travel sickness like scopolamine  dexamethasone  dofetilide  ipratropium  NSAIDs, medicines for pain and inflammation, like ibuprofen or naproxen  other medicines for Alzheimer's disease  other medicines that prolong the QT interval (cause an abnormal heart rhythm)  phenobarbital  phenytoin  rifampin, rifabutin or rifapentine  ziprasidone This list may not describe all possible  interactions. Give your health care provider a list of all the medicines, herbs, non-prescription drugs, or dietary supplements you use. Also tell them if you smoke, drink alcohol, or use illegal drugs. Some items may interact with your medicine. What should I watch for while using this medicine? Visit your doctor or health care professional for regular checks on your progress. Check with your doctor or health care professional if your symptoms do not get better or if they get worse. You may get drowsy or dizzy. Do not drive, use machinery, or do anything that needs mental alertness until you know how this drug affects you. What side effects may I notice from receiving this medicine? Side effects that you should report to your doctor or health care professional as soon as possible:  allergic reactions like skin rash, itching or hives, swelling of the face, lips, or tongue  feeling faint or lightheaded, falls  loss of bladder control  seizures  signs and symptoms of a dangerous change in heartbeat or heart rhythm like chest pain; dizziness; fast or irregular heartbeat; palpitations; feeling faint or lightheaded, falls; breathing problems  signs and symptoms of infection like fever or chills; cough; sore throat; pain or trouble passing urine  signs and symptoms of liver injury like dark yellow or brown urine; general ill feeling or flu-like symptoms; light-colored stools; loss of appetite; nausea; right upper belly pain; unusually weak or tired; yellowing of the eyes or skin  slow heartbeat or palpitations  unusual bleeding or bruising  vomiting Side effects that usually do not require medical attention (report to your doctor or health care professional if they continue or are bothersome):  diarrhea, especially when starting treatment  headache  loss of appetite  muscle cramps  nausea  stomach upset This list may not describe all possible side effects. Call your doctor for  medical advice about side effects. You may report side effects to FDA at 1-800-FDA-1088. Where  should I keep my medicine? Keep out of reach of children. Store at room temperature between 15 and 30 degrees C (59 and 86 degrees F). Throw away any unused medicine after the expiration date. NOTE: This sheet is a summary. It may not cover all possible information. If you have questions about this medicine, talk to your doctor, pharmacist, or health care provider.  2020 Elsevier/Gold Standard (2018-12-02 10:33:41)

## 2020-10-24 DIAGNOSIS — F0280 Dementia in other diseases classified elsewhere without behavioral disturbance: Secondary | ICD-10-CM | POA: Insufficient documentation

## 2020-10-24 DIAGNOSIS — G301 Alzheimer's disease with late onset: Secondary | ICD-10-CM | POA: Insufficient documentation

## 2020-11-15 ENCOUNTER — Other Ambulatory Visit: Payer: Self-pay | Admitting: Neurology

## 2020-11-15 MED ORDER — MEMANTINE HCL 10 MG PO TABS: ORAL_TABLET | ORAL | 6 refills | Status: AC

## 2020-11-22 DIAGNOSIS — Z85828 Personal history of other malignant neoplasm of skin: Secondary | ICD-10-CM | POA: Diagnosis not present

## 2020-11-22 DIAGNOSIS — D0439 Carcinoma in situ of skin of other parts of face: Secondary | ICD-10-CM | POA: Diagnosis not present

## 2020-11-22 DIAGNOSIS — L570 Actinic keratosis: Secondary | ICD-10-CM | POA: Diagnosis not present

## 2020-11-22 DIAGNOSIS — L821 Other seborrheic keratosis: Secondary | ICD-10-CM | POA: Diagnosis not present

## 2020-11-23 ENCOUNTER — Encounter: Payer: Medicare HMO | Admitting: Internal Medicine

## 2020-11-24 ENCOUNTER — Telehealth: Payer: Self-pay | Admitting: Cardiology

## 2020-11-24 DIAGNOSIS — R69 Illness, unspecified: Secondary | ICD-10-CM | POA: Diagnosis not present

## 2020-11-24 NOTE — Telephone Encounter (Signed)
Patient's wife, Deatra Canter, states that her husband is sleeping a lot throughout the day. He is seeing Pain Management who believes he needs to have another sleep study to make sure that his medication is not causing this sleepiness. They have decided to stop further advisement until they know what is causing this. Please advise.

## 2020-11-24 NOTE — Telephone Encounter (Signed)
Left message for patient's wife to call back.  

## 2020-11-30 NOTE — Telephone Encounter (Signed)
Left message for patient to call back  

## 2020-11-30 NOTE — Telephone Encounter (Signed)
    Pt returning Carly's call

## 2020-12-02 NOTE — Telephone Encounter (Signed)
Spoke with the patient's wife who states that his pain management doctor has him on new medication. He has been having increased daytime sleepiness and pain management does not think the medication is the cause. They have talked to them about his sleep apnea and they were advised to contact Dr. Radford Pax for further advisement and possible repeat sleep study. The patient is scheduled for his yearly FU with Dr. Radford Pax on 12/14 and I have advised the patient's wife that they can discuss this further with her at that time. She verbalized understanding.

## 2020-12-02 NOTE — Telephone Encounter (Signed)
Patient's wife returning call. 

## 2020-12-07 ENCOUNTER — Encounter: Payer: Self-pay | Admitting: Cardiology

## 2020-12-07 ENCOUNTER — Other Ambulatory Visit: Payer: Self-pay

## 2020-12-07 ENCOUNTER — Ambulatory Visit (INDEPENDENT_AMBULATORY_CARE_PROVIDER_SITE_OTHER): Payer: Medicare HMO | Admitting: Cardiology

## 2020-12-07 VITALS — BP 100/60 | HR 71 | Ht 70.0 in | Wt 159.6 lb

## 2020-12-07 DIAGNOSIS — G4733 Obstructive sleep apnea (adult) (pediatric): Secondary | ICD-10-CM | POA: Diagnosis not present

## 2020-12-07 DIAGNOSIS — I10 Essential (primary) hypertension: Secondary | ICD-10-CM | POA: Diagnosis not present

## 2020-12-07 DIAGNOSIS — I480 Paroxysmal atrial fibrillation: Secondary | ICD-10-CM | POA: Diagnosis not present

## 2020-12-07 NOTE — Progress Notes (Signed)
Date:  12/07/2020   ID:  Alan Knight., DOB [birth year], MRN [id number]    PCP:  Reynold Bowen, MD  Cardiologist:  Thompson Grayer, MD Sleep Medicine:  Fransico Him, MD Electrophysiologist:  Thompson Grayer, MD   Chief Complaint:  OSA  History of Present Illness:    Alan Knight. is a 74 y.o. male with a hx of hypertension, hyperlipidemia and paroxysmal atrial fibrillation.  He was referred for sleep study due to fatigue and snoring.  He was found to have moderate obstructive sleep apnea with an AHI of 19.9/h with no central sleep apnea.  Oxygen desaturations were as low as 89%.  He underwent CPAP titration but could not be adequately titrated to doing ongoing respiratory events.  He underwent BiPAP titration to 18/14cm H2O.  He is doing well with his CPAP device and thinks that he has gotten used to it.  He tolerates the mask and feels the pressure is adequate.  Since going on CPAP he feels rested in the am but his wife is concerned that he sleeps a lot during the day.  He denies any significant mouth or nasal dryness or nasal congestion.  His wife says that he snores some.    He denies any chest pain or pressure, SOB, DOE, PND, orthopnea, LE edema, dizziness, palpitations or syncope. He is compliant with his meds and is tolerating meds with no SE.    Prior CV studies:   The following studies were reviewed today:  PAP compliance download  Past Medical History:  Diagnosis Date   Arthritis    Basal cell carcinoma    BPH (benign prostatic hypertrophy) with urinary obstruction    Chest tightness 02/21/2017   Chronic bilateral low back pain without sciatica 05/17/2017   Chronic pain of both knees 10/03/2017   Chronic pain syndrome 10/03/2017   Cough 02/21/2017   Diverticulosis    GERD (gastroesophageal reflux disease)    Headache    Hyperlipidemia    Hypertension    Hypogonadism male    Internal hemorrhoids    Migraine    OA (osteoarthritis)    OSA  treated with BiPAP 11/13/2018   moderate obstructive sleep apnea with an AHI of 19.9/h with no central sleep apnea.  Oxygen desaturations were as low as 89%.  Now on BIPAP at 18/14cm H2O.   Osteoarthritis 02/21/2017   Paroxysmal atrial fibrillation (HCC)    Primary osteoarthritis of left knee 11/09/2015   S/P total knee replacement using cement 11/09/2015   Spondylosis without myelopathy or radiculopathy, lumbar region 05/17/2017   Status post left knee replacement 10/03/2017   Overview:  Added automatically from request for surgery [id number]   Past Surgical History:  Procedure Laterality Date   ATRIAL FIBRILLATION ABLATION N/A 11/16/2017   Procedure: ATRIAL FIBRILLATION ABLATION;  Surgeon: Thompson Grayer, MD;  Location: [facility];  Service: Cardiovascular;  Laterality: N/A;   CARPAL TUNNEL RELEASE     left   genicular nerve ablation     HIP ARTHROPLASTY     JOINT REPLACEMENT     KNEE ARTHROPLASTY     right   KNEE SURGERY     lumbar nerve ablation     SHOULDER SURGERY     bilateral   TOTAL KNEE ARTHROPLASTY     left   TOTAL KNEE ARTHROPLASTY Left 11/09/2015   Procedure: TOTAL KNEE ARTHROPLASTY;  Surgeon: Garald Balding, MD;  Location: [facility];  Service: Orthopedics;  Laterality: Left;  Current Meds  Medication Sig   acetaminophen (TYLENOL) 325 MG tablet Take 650 mg every 6 (six) hours as needed by mouth for moderate pain or headache.   Ascorbic Acid (VITAMIN C) 1000 MG tablet Take 1,000 mg by mouth daily.   [START ON 12/08/2020] Buprenorphine HCl (BELBUCA) 150 MCG FILM Place 150 mcg inside cheek every 12 (twelve) hours.   calcium acetate (PHOSLO) 667 MG capsule Take 500 mg by mouth daily.   Cholecalciferol (VITAMIN D3) 5000 UNITS CAPS Take 5,000 Units daily by mouth.    co-enzyme Q-10 30 MG capsule Take 300 mg by mouth daily.   donepezil (ARICEPT) 10 MG tablet Take 1 tablet (10 mg total) by mouth at bedtime.   DULoxetine (CYMBALTA) 60 MG  capsule Take 1 capsule (60 mg total) by mouth daily.   ELIQUIS 5 MG TABS tablet TAKE 1 TABLET BY MOUTH TWICE DAILY   ezetimibe (ZETIA) 10 MG tablet Take 10 mg by mouth daily.   gabapentin (NEURONTIN) 300 MG capsule Take 1 capsule by mouth daily.   lisinopril (PRINIVIL,ZESTRIL) 5 MG tablet Take 10 mg by mouth daily.    memantine (NAMENDA) 10 MG tablet Start with one pill(10mg ) at bedtime. In 2 weeks, if no side effects, please increase to one pill(10mg ) in the morning and one pill(10mg ) in the evening. Thank you.   Multiple Minerals-Vitamins (CAL-MAG-ZINC-D) TABS Take 1 tablet daily by mouth.   Omega-3 1000 MG CAPS Take 1 capsule by mouth daily.   Probiotic Product ([place]) Take by mouth.   simvastatin (ZOCOR) 40 MG tablet Take 40 mg by mouth daily.   VITAMIN B COMPLEX-C PO Take by mouth.     Allergies:   Patient has no known allergies.   Social History   Tobacco Use   Smoking status: Never Smoker   Smokeless tobacco: Never Used  Vaping Use   Vaping Use: Never used  Substance Use Topics   Alcohol use: Not Currently    Comment: 1 mixed drink per day   Drug use: No     Family Hx: The patient's family history includes CVA in his brother; Colon cancer in an other family member; Heart attack in his father; Memory loss in his mother.  ROS:   Please see the history of present illness.     All other systems reviewed and are negative.   Labs/Other Tests and Data Reviewed:    Recent Labs: 03/09/2020: ALT 33; BUN 23; Creatinine, Ser 0.91; Hemoglobin 14.7; Platelets 170; Potassium 4.4; Sodium 141; TSH 1.540   Recent Lipid Panel No results found for: CHOL, TRIG, HDL, CHOLHDL, LDLCALC, LDLDIRECT  Wt Readings from Last 3 Encounters:  12/07/20 159 lb 9.6 oz (72.4 kg)  10/21/20 158 lb 3.2 oz (71.8 kg)  09/22/20 162 lb (73.5 kg)     Objective:    Vital Signs:  BP 100/60    Pulse 71    Ht 5\' 10"  (1.778 m)    Wt 159 lb 9.6 oz (72.4 kg)    SpO2 93%     BMI 22.90 kg/m    GEN: Well nourished, well developed in no acute distress HEENT: Normal NECK: No JVD; No carotid bruits LYMPHATICS: No lymphadenopathy CARDIAC:RRR, no murmurs, rubs, gallops RESPIRATORY:  Clear to auscultation without rales, wheezing or rhonchi  ABDOMEN: Soft, non-tender, non-distended MUSCULOSKELETAL:  No edema; No deformity  SKIN: Warm and dry NEUROLOGIC:  Alert and oriented x 3 PSYCHIATRIC:  Normal affect   EKG was performed in the office  today showing NSR at 71bpm with PACs and septal infarct  ASSESSMENT & PLAN:    1.  OSA - The patient is tolerating PAP therapy well without any problems. The PAP download was reviewed today and showed an AHI of 1.4/hr on 18/14 cm H2O with 87% compliance in using more than 4 hours nightly.  The patient has been using and benefiting from PAP use and will continue to benefit from therapy.  -he says that his mask does not leak much despite his download showing a mask leak -I encouraged him to change out his cushion every 6-8 weeks and keep his beard shaved to ensure good mask seal -his OSA is adequately treated and not the cause of his excessive daytime sleepiness  2. HTN -BP is adequately controlled on exam today -continue Lisinopril 10mg  daily -SCr stable at 0.8 and K+ 4.4 in July 2021  3.  PAF -he is maintaining NSR on exam with no palpitations -he denies any bleeding issues with the DOAC -continue Eliquis 5mg  BID for CHADS2VASC score of 2 -Hbg normal at 15.7 in July 2021  Medication Adjustments/Labs and Tests Ordered: Current medicines are reviewed at length with the patient today.  Concerns regarding medicines are outlined above.  Tests Ordered: Orders Placed This Encounter  Procedures   EKG 12-Lead   Medication Changes: No orders of the defined types were placed in this encounter.   Disposition:  Follow up in 1 year(s)  Signed, Fransico Him, MD  12/07/2020 3:41 PM    [facility]

## 2020-12-07 NOTE — Patient Instructions (Signed)

## 2020-12-09 ENCOUNTER — Ambulatory Visit: Payer: Medicare HMO | Admitting: Orthopaedic Surgery

## 2020-12-09 ENCOUNTER — Encounter: Payer: Self-pay | Admitting: Orthopaedic Surgery

## 2020-12-09 ENCOUNTER — Other Ambulatory Visit: Payer: Self-pay

## 2020-12-09 VITALS — Ht 70.0 in | Wt 159.0 lb

## 2020-12-09 DIAGNOSIS — M65332 Trigger finger, left middle finger: Secondary | ICD-10-CM

## 2020-12-09 DIAGNOSIS — M65321 Trigger finger, right index finger: Secondary | ICD-10-CM

## 2020-12-09 NOTE — Progress Notes (Signed)
Office Visit Note   Patient: Alan Knight.           Date of Birth: 04-11-1946           MRN: [id number] Visit Date: 12/09/2020              Requested by: Reynold Bowen, MD [place],  [place] [id number] PCP: Reynold Bowen, MD   Assessment & Plan: Visit Diagnoses:  1. Trigger finger, right index finger   2. Trigger finger, left middle finger    Left long and right index trigger fingers.  Has had prior trigger fingers injected with cortisone and will repeat the same today to both hands. Plan:  #1: The right index and left long A1 pulley was injected by Dr. Durward Fortes.  Tolerated the procedure well. #2: Follow back up as needed  Follow-Up Instructions: Return if symptoms worsen or fail to improve.   Orders:  Orders Placed This Encounter  Procedures  . Hand/UE Inj   No orders of the defined types were placed in this encounter.     Procedures: Hand/UE Inj (Right index and left long A-1 pulley injection) for trigger finger on 12/09/2020 3:34 PM Indications: tendon swelling Details: 27 G needle, volar approach Outcome: tolerated well, no immediate complications  Half cc of 1% Xylocaine without epinephrine and 1/2 cc of betamethasone injected into the A1 pulley of the right index and left long finger Procedure, treatment alternatives, risks and benefits explained, specific risks discussed. Consent was given by the patient. Immediately prior to procedure a time out was called to verify the correct patient, procedure, equipment, support staff and site/side marked as required. Patient was prepped and draped in the usual sterile fashion.       Clinical Data: No additional findings.   Subjective: Chief Complaint  Patient presents with  . Left Hand - Pain  . Right Hand - Pain   HPI Patient presents today for both hands. He said that his left middle finger and right index finger are getting stuck in the flexed position. They are also painful when this  happens.  No related numbness or tingling or prior injury   Review of Systems   Objective: Vital Signs: Ht 5\' 10"  (1.778 m)   Wt 159 lb (72.1 kg)   BMI 22.81 kg/m   Physical Exam Constitutional:      Appearance: Normal appearance. He is well-developed, normal weight and well-nourished.  HENT:     Head: Normocephalic.     Nose: Nose normal.     Mouth/Throat:     Mouth: Oropharynx is clear and moist.  Eyes:     Extraocular Movements: EOM normal.     Pupils: Pupils are equal, round, and reactive to light.  Pulmonary:     Effort: Pulmonary effort is normal.  Skin:    General: Skin is warm and dry.  Neurological:     Mental Status: He is alert and oriented to person, place, and time. Mental status is at baseline.  Psychiatric:        Mood and Affect: Mood and affect normal.        Behavior: Behavior normal.     Ortho Exam  Palpable nodule at right index and left long A1 pulley area.  He does have some triggering noted actively.  Neurologically intact.  No swelling of the digits   Specialty Comments:  No specialty comments available.  Imaging: No results found.   PMFS History: Current Outpatient Medications  Medication Sig Dispense Refill  . acetaminophen (TYLENOL) 325 MG tablet Take 650 mg every 6 (six) hours as needed by mouth for moderate pain or headache.    . Ascorbic Acid (VITAMIN C) 1000 MG tablet Take 1,000 mg by mouth daily.    . Buprenorphine HCl (BELBUCA) 150 MCG FILM Place 150 mcg inside cheek every 12 (twelve) hours.    . calcium acetate (PHOSLO) 667 MG capsule Take 500 mg by mouth daily.    . Cholecalciferol (VITAMIN D3) 5000 UNITS CAPS Take 5,000 Units daily by mouth.     . co-enzyme Q-10 30 MG capsule Take 300 mg by mouth daily.    Marland Kitchen donepezil (ARICEPT) 10 MG tablet Take 1 tablet (10 mg total) by mouth at bedtime. 90 tablet 3  . DULoxetine (CYMBALTA) 60 MG capsule Take 1 capsule (60 mg total) by mouth daily. 30 capsule 3  . ELIQUIS 5 MG TABS tablet  TAKE 1 TABLET BY MOUTH TWICE DAILY 180 tablet 1  . ezetimibe (ZETIA) 10 MG tablet Take 10 mg by mouth daily.    Marland Kitchen gabapentin (NEURONTIN) 300 MG capsule Take 1 capsule by mouth daily.    Marland Kitchen lisinopril (PRINIVIL,ZESTRIL) 5 MG tablet Take 10 mg by mouth daily.     . memantine (NAMENDA) 10 MG tablet Start with one pill(10mg ) at bedtime. In 2 weeks, if no side effects, please increase to one pill(10mg ) in the morning and one pill(10mg ) in the evening. Thank you. 60 tablet 6  . Multiple Minerals-Vitamins (CAL-MAG-ZINC-D) TABS Take 1 tablet daily by mouth.    . Omega-3 1000 MG CAPS Take 1 capsule by mouth daily.    . Probiotic Product ([place]) Take by mouth.    . simvastatin (ZOCOR) 40 MG tablet Take 40 mg by mouth daily.    Marland Kitchen VITAMIN B COMPLEX-C PO Take by mouth.     No current facility-administered medications for this visit.    Patient Active Problem List   Diagnosis Date Noted  . Trigger finger, left middle finger 12/09/2020  . Trigger finger, right index finger 12/09/2020  . Late onset Alzheimer's dementia without behavioral disturbance ([facility]) 10/24/2020  . OSA treated with BiPAP 11/13/2018  . Chronic pain of both knees 10/03/2017  . Chronic pain syndrome 10/03/2017  . Status post left knee replacement 10/03/2017  . Spondylosis without myelopathy or radiculopathy, lumbar region 05/17/2017  . Chronic bilateral low back pain without sciatica 05/17/2017  . Chest tightness 02/21/2017  . Cough 02/21/2017  . Osteoarthritis 02/21/2017  . Primary osteoarthritis of left knee 11/09/2015  . S/P total knee replacement using cement 11/09/2015   Past Medical History:  Diagnosis Date  . Arthritis   . Basal cell carcinoma   . BPH (benign prostatic hypertrophy) with urinary obstruction   . Chest tightness 02/21/2017  . Chronic bilateral low back pain without sciatica 05/17/2017  . Chronic pain of both knees 10/03/2017  . Chronic pain syndrome 10/03/2017  . Cough 02/21/2017  .  Diverticulosis   . GERD (gastroesophageal reflux disease)   . Headache   . Hyperlipidemia   . Hypertension   . Hypogonadism male   . Internal hemorrhoids   . Migraine   . OA (osteoarthritis)   . OSA treated with BiPAP 11/13/2018   moderate obstructive sleep apnea with an AHI of 19.9/h with no central sleep apnea.  Oxygen desaturations were as low as 89%.  Now on BIPAP at 18/14cm H2O.  . Osteoarthritis 02/21/2017  . Paroxysmal atrial fibrillation (  [facility])   . Primary osteoarthritis of left knee 11/09/2015  . S/P total knee replacement using cement 11/09/2015  . Spondylosis without myelopathy or radiculopathy, lumbar region 05/17/2017  . Status post left knee replacement 10/03/2017   Overview:  Added automatically from request for surgery [id number]    Family History  Problem Relation Age of Onset  . Heart attack Father   . CVA Brother   . Memory loss Mother        was in memory care for 15+ years  . Colon cancer Other        uncle according to 2011 colon report    Past Surgical History:  Procedure Laterality Date  . ATRIAL FIBRILLATION ABLATION N/A 11/16/2017   Procedure: ATRIAL FIBRILLATION ABLATION;  Surgeon: Thompson Grayer, MD;  Location: [facility];  Service: Cardiovascular;  Laterality: N/A;  . CARPAL TUNNEL RELEASE     left  . genicular nerve ablation    . HIP ARTHROPLASTY    . JOINT REPLACEMENT    . KNEE ARTHROPLASTY     right  . KNEE SURGERY    . lumbar nerve ablation    . SHOULDER SURGERY     bilateral  . TOTAL KNEE ARTHROPLASTY     left  . TOTAL KNEE ARTHROPLASTY Left 11/09/2015   Procedure: TOTAL KNEE ARTHROPLASTY;  Surgeon: Garald Balding, MD;  Location: [facility];  Service: Orthopedics;  Laterality: Left;   Social History   Occupational History  . Occupation: Sales  Tobacco Use  . Smoking status: Never Smoker  . Smokeless tobacco: Never Used  Vaping Use  . Vaping Use: Never used  Substance and Sexual Activity  . Alcohol use: Not Currently     Comment: 1 mixed drink per day  . Drug use: No  . Sexual activity: Not on file

## 2020-12-31 ENCOUNTER — Ambulatory Visit: Payer: Medicare HMO | Admitting: Cardiology

## 2021-01-04 DIAGNOSIS — C44319 Basal cell carcinoma of skin of other parts of face: Secondary | ICD-10-CM | POA: Diagnosis not present

## 2021-01-06 ENCOUNTER — Ambulatory Visit: Payer: Medicare HMO | Admitting: Orthopaedic Surgery

## 2021-02-22 DIAGNOSIS — G4733 Obstructive sleep apnea (adult) (pediatric): Secondary | ICD-10-CM | POA: Diagnosis not present

## 2021-03-15 DIAGNOSIS — H5213 Myopia, bilateral: Secondary | ICD-10-CM | POA: Diagnosis not present

## 2021-03-15 DIAGNOSIS — M545 Low back pain, unspecified: Secondary | ICD-10-CM | POA: Diagnosis not present

## 2021-04-28 DIAGNOSIS — M47814 Spondylosis without myelopathy or radiculopathy, thoracic region: Secondary | ICD-10-CM | POA: Diagnosis not present

## 2021-04-28 DIAGNOSIS — M4180 Other forms of scoliosis, site unspecified: Secondary | ICD-10-CM | POA: Diagnosis not present

## 2021-04-28 DIAGNOSIS — M4186 Other forms of scoliosis, lumbar region: Secondary | ICD-10-CM | POA: Diagnosis not present

## 2021-04-28 DIAGNOSIS — G9589 Other specified diseases of spinal cord: Secondary | ICD-10-CM | POA: Diagnosis not present

## 2021-04-28 DIAGNOSIS — M4804 Spinal stenosis, thoracic region: Secondary | ICD-10-CM | POA: Diagnosis not present

## 2021-05-03 DIAGNOSIS — M48061 Spinal stenosis, lumbar region without neurogenic claudication: Secondary | ICD-10-CM | POA: Diagnosis not present

## 2021-05-03 DIAGNOSIS — M47816 Spondylosis without myelopathy or radiculopathy, lumbar region: Secondary | ICD-10-CM | POA: Diagnosis not present

## 2021-05-03 DIAGNOSIS — M4135 Thoracogenic scoliosis, thoracolumbar region: Secondary | ICD-10-CM | POA: Diagnosis not present

## 2021-05-03 DIAGNOSIS — M545 Low back pain, unspecified: Secondary | ICD-10-CM | POA: Diagnosis not present

## 2021-05-04 ENCOUNTER — Ambulatory Visit: Payer: Medicare HMO | Admitting: Orthopaedic Surgery

## 2021-05-12 ENCOUNTER — Encounter: Payer: Medicare HMO | Admitting: Psychology

## 2021-06-02 ENCOUNTER — Ambulatory Visit (INDEPENDENT_AMBULATORY_CARE_PROVIDER_SITE_OTHER): Payer: Medicare HMO | Admitting: Orthopaedic Surgery

## 2021-06-02 ENCOUNTER — Other Ambulatory Visit: Payer: Self-pay

## 2021-06-02 ENCOUNTER — Encounter: Payer: Self-pay | Admitting: Orthopaedic Surgery

## 2021-06-02 DIAGNOSIS — M65332 Trigger finger, left middle finger: Secondary | ICD-10-CM

## 2021-06-02 DIAGNOSIS — M65331 Trigger finger, right middle finger: Secondary | ICD-10-CM

## 2021-06-02 MED ORDER — LIDOCAINE HCL 1 % IJ SOLN
1.0000 mL | INTRAMUSCULAR | Status: AC | PRN
  Administered 2021-06-02: 1 mL

## 2021-06-02 NOTE — Progress Notes (Signed)
Office Visit Note   Patient: Alan Knight.           Date of Birth: 10-May-1946           MRN: [id number] Visit Date: 06/02/2021              Requested by: Reynold Bowen, MD [place],   [id number] PCP: Reynold Bowen, MD   Assessment & Plan: Visit Diagnoses:  1. Trigger finger, left middle finger   2. Trigger finger, right middle finger     Plan: Bilateral long finger triggering.  Will inject with betamethasone and Xylocaine  Follow-Up Instructions: Return if symptoms worsen or fail to improve.   Orders:  No orders of the defined types were placed in this encounter.  No orders of the defined types were placed in this encounter.     Procedures: Hand/UE Inj: R long A1 for trigger finger on 06/02/2021 2:29 PM Medications: 1 mL lidocaine 1 %  3 mg betamethasone injected with Xylocaine into  rightlong trigger finger   Hand/UE Inj: L long A1 for trigger finger on 06/02/2021 2:33 PM Medications: 1 mL lidocaine 1 %  3 mg betamethasone injected with Xylocaine and into the left long trigger finger     Clinical Data: No additional findings.   Subjective: Chief Complaint  Patient presents with   Left Middle Finger - Pain    Trigger finger   Right Middle Finger - Pain    Trigger Finger  Recurrent symptoms of triggering right and left long finger.  There is active triggering.  Patient would like to have injection.  HPI  Review of Systems   Objective: Vital Signs: There were no vitals taken for this visit.  Physical Exam Constitutional:      Appearance: He is well-developed.  Eyes:     Pupils: Pupils are equal, round, and reactive to light.  Pulmonary:     Effort: Pulmonary effort is normal.  Skin:    General: Skin is warm and dry.  Neurological:     Mental Status: He is alert and oriented to person, place, and time.  Psychiatric:        Behavior: Behavior normal.    Ortho Exam active triggering of right and left long finger with  palpable painful nodule on the palmar aspect at the level of the metacarpal phalangeal joint.  Neurologically intact  Specialty Comments:  No specialty comments available.  Imaging: No results found.   PMFS History: Patient Active Problem List   Diagnosis Date Noted   Trigger finger, right middle finger 06/02/2021   Trigger finger, right index finger 12/09/2020   Late onset Alzheimer's dementia without behavioral disturbance ([facility]) 10/24/2020   OSA treated with BiPAP 11/13/2018   Chronic pain of both knees 10/03/2017   Chronic pain syndrome 10/03/2017   Status post left knee replacement 10/03/2017   Spondylosis without myelopathy or radiculopathy, lumbar region 05/17/2017   Chronic bilateral low back pain without sciatica 05/17/2017   Chest tightness 02/21/2017   Cough 02/21/2017   Osteoarthritis 02/21/2017   Primary osteoarthritis of left knee 11/09/2015   S/P total knee replacement using cement 11/09/2015   Past Medical History:  Diagnosis Date   Arthritis    Basal cell carcinoma    BPH (benign prostatic hypertrophy) with urinary obstruction    Chest tightness 02/21/2017   Chronic bilateral low back pain without sciatica 05/17/2017   Chronic pain of both knees 10/03/2017   Chronic pain syndrome 10/03/2017  Cough 02/21/2017   Diverticulosis    GERD (gastroesophageal reflux disease)    Headache    Hyperlipidemia    Hypertension    Hypogonadism male    Internal hemorrhoids    Migraine    OA (osteoarthritis)    OSA treated with BiPAP 11/13/2018   moderate obstructive sleep apnea with an AHI of 19.9/h with no central sleep apnea.  Oxygen desaturations were as low as 89%.  Now on BIPAP at 18/14cm H2O.   Osteoarthritis 02/21/2017   Paroxysmal atrial fibrillation (HCC)    Primary osteoarthritis of left knee 11/09/2015   S/P total knee replacement using cement 11/09/2015   Spondylosis without myelopathy or radiculopathy, lumbar region 05/17/2017   Status post left knee  replacement 10/03/2017   Overview:  Added automatically from request for surgery [id number]    Family History  Problem Relation Age of Onset   Heart attack Father    CVA Brother    Memory loss Mother        was in memory care for 15+ years   Colon cancer Other        uncle according to 2011 colon report    Past Surgical History:  Procedure Laterality Date   ATRIAL FIBRILLATION ABLATION N/A 11/16/2017   Procedure: ATRIAL FIBRILLATION ABLATION;  Surgeon: Thompson Grayer, MD;  Location: [facility];  Service: Cardiovascular;  Laterality: N/A;   CARPAL TUNNEL RELEASE     left   genicular nerve ablation     HIP ARTHROPLASTY     JOINT REPLACEMENT     KNEE ARTHROPLASTY     right   KNEE SURGERY     lumbar nerve ablation     SHOULDER SURGERY     bilateral   TOTAL KNEE ARTHROPLASTY     left   TOTAL KNEE ARTHROPLASTY Left 11/09/2015   Procedure: TOTAL KNEE ARTHROPLASTY;  Surgeon: Garald Balding, MD;  Location: [facility];  Service: Orthopedics;  Laterality: Left;   Social History   Occupational History   Occupation: Sales  Tobacco Use   Smoking status: Never   Smokeless tobacco: Never  Vaping Use   Vaping Use: Never used  Substance and Sexual Activity   Alcohol use: Not Currently    Comment: 1 mixed drink per day   Drug use: No   Sexual activity: Not on file     Garald Balding, MD   Note - This record has been created using [employer].  Chart creation errors have been sought, but may not always  have been located. Such creation errors do not reflect on  the standard of medical care.

## 2021-06-22 DIAGNOSIS — I4891 Unspecified atrial fibrillation: Secondary | ICD-10-CM | POA: Diagnosis not present

## 2021-06-22 DIAGNOSIS — M48061 Spinal stenosis, lumbar region without neurogenic claudication: Secondary | ICD-10-CM | POA: Diagnosis not present

## 2021-06-22 DIAGNOSIS — M6281 Muscle weakness (generalized): Secondary | ICD-10-CM | POA: Diagnosis not present

## 2021-06-22 DIAGNOSIS — R2689 Other abnormalities of gait and mobility: Secondary | ICD-10-CM | POA: Diagnosis not present

## 2021-06-22 DIAGNOSIS — E559 Vitamin D deficiency, unspecified: Secondary | ICD-10-CM | POA: Diagnosis not present

## 2021-06-22 DIAGNOSIS — E782 Mixed hyperlipidemia: Secondary | ICD-10-CM | POA: Diagnosis not present

## 2021-06-22 DIAGNOSIS — Z48811 Encounter for surgical aftercare following surgery on the nervous system: Secondary | ICD-10-CM | POA: Diagnosis not present

## 2021-06-22 DIAGNOSIS — E569 Vitamin deficiency, unspecified: Secondary | ICD-10-CM | POA: Diagnosis not present

## 2021-06-22 DIAGNOSIS — M4320 Fusion of spine, site unspecified: Secondary | ICD-10-CM | POA: Diagnosis not present

## 2021-06-22 DIAGNOSIS — M1990 Unspecified osteoarthritis, unspecified site: Secondary | ICD-10-CM | POA: Diagnosis not present

## 2021-06-22 DIAGNOSIS — Z4789 Encounter for other orthopedic aftercare: Secondary | ICD-10-CM | POA: Diagnosis not present

## 2021-06-22 DIAGNOSIS — M419 Scoliosis, unspecified: Secondary | ICD-10-CM | POA: Diagnosis not present

## 2021-06-22 DIAGNOSIS — D509 Iron deficiency anemia, unspecified: Secondary | ICD-10-CM | POA: Diagnosis not present

## 2021-06-22 DIAGNOSIS — G629 Polyneuropathy, unspecified: Secondary | ICD-10-CM | POA: Diagnosis not present

## 2021-06-22 DIAGNOSIS — I10 Essential (primary) hypertension: Secondary | ICD-10-CM | POA: Diagnosis not present

## 2021-06-22 DIAGNOSIS — D513 Other dietary vitamin B12 deficiency anemia: Secondary | ICD-10-CM | POA: Diagnosis not present

## 2021-06-22 DIAGNOSIS — M4802 Spinal stenosis, cervical region: Secondary | ICD-10-CM | POA: Diagnosis not present

## 2021-06-27 DIAGNOSIS — M419 Scoliosis, unspecified: Secondary | ICD-10-CM | POA: Diagnosis not present

## 2021-06-27 DIAGNOSIS — G629 Polyneuropathy, unspecified: Secondary | ICD-10-CM | POA: Diagnosis not present

## 2021-06-27 DIAGNOSIS — I4891 Unspecified atrial fibrillation: Secondary | ICD-10-CM | POA: Diagnosis not present

## 2021-06-27 DIAGNOSIS — D509 Iron deficiency anemia, unspecified: Secondary | ICD-10-CM | POA: Diagnosis not present

## 2021-06-28 DIAGNOSIS — Z4789 Encounter for other orthopedic aftercare: Secondary | ICD-10-CM | POA: Diagnosis not present

## 2021-06-28 DIAGNOSIS — M4320 Fusion of spine, site unspecified: Secondary | ICD-10-CM | POA: Diagnosis not present

## 2021-06-28 DIAGNOSIS — M6281 Muscle weakness (generalized): Secondary | ICD-10-CM | POA: Diagnosis not present

## 2021-06-28 DIAGNOSIS — R2689 Other abnormalities of gait and mobility: Secondary | ICD-10-CM | POA: Diagnosis not present

## 2021-06-30 ENCOUNTER — Telehealth: Payer: Self-pay | Admitting: Orthopaedic Surgery

## 2021-06-30 NOTE — Telephone Encounter (Signed)
Pt wife called and would like to speak about rehab with someone.   CB [phone]

## 2021-07-01 DIAGNOSIS — I10 Essential (primary) hypertension: Secondary | ICD-10-CM | POA: Diagnosis not present

## 2021-07-01 DIAGNOSIS — Z4789 Encounter for other orthopedic aftercare: Secondary | ICD-10-CM | POA: Diagnosis not present

## 2021-07-01 DIAGNOSIS — G309 Alzheimer's disease, unspecified: Secondary | ICD-10-CM | POA: Diagnosis not present

## 2021-07-01 DIAGNOSIS — M1990 Unspecified osteoarthritis, unspecified site: Secondary | ICD-10-CM | POA: Diagnosis not present

## 2021-07-01 DIAGNOSIS — I480 Paroxysmal atrial fibrillation: Secondary | ICD-10-CM | POA: Diagnosis not present

## 2021-07-01 DIAGNOSIS — E785 Hyperlipidemia, unspecified: Secondary | ICD-10-CM | POA: Diagnosis not present

## 2021-07-01 DIAGNOSIS — G4733 Obstructive sleep apnea (adult) (pediatric): Secondary | ICD-10-CM | POA: Diagnosis not present

## 2021-07-01 DIAGNOSIS — D62 Acute posthemorrhagic anemia: Secondary | ICD-10-CM | POA: Diagnosis not present

## 2021-07-01 DIAGNOSIS — R69 Illness, unspecified: Secondary | ICD-10-CM | POA: Diagnosis not present

## 2021-07-01 DIAGNOSIS — Z981 Arthrodesis status: Secondary | ICD-10-CM | POA: Diagnosis not present

## 2021-07-01 NOTE — Telephone Encounter (Signed)
Could you please call patient's wife? She states that he had significant back surgery 2 weeks ago and she feels that he needs rehab on his knees, which you have done multiple surgeries on. When the patient was discharged from the hospital, the social worker put him in a nursing home. His wife was not pleased with this and got him discharged yesterday and brought him home. She is asking about facilities, other than nursing homes, that the patient can go to for rehab. I explained that patient's are normally admitted to rehab facilities from the hospital, and that this is not something that is done straight through a provider's office. I also discussed HHPT with her. She states that they have heard from friends about a facility 89 were buying outright for circumstances such as this one.  I explained that I was unaware of any of these such facilities, but would ask to see if you were aware of anything. She would like a call back. Thanks.

## 2021-07-05 DIAGNOSIS — Z4789 Encounter for other orthopedic aftercare: Secondary | ICD-10-CM | POA: Diagnosis not present

## 2021-07-05 DIAGNOSIS — M1990 Unspecified osteoarthritis, unspecified site: Secondary | ICD-10-CM | POA: Diagnosis not present

## 2021-07-05 DIAGNOSIS — G309 Alzheimer's disease, unspecified: Secondary | ICD-10-CM | POA: Diagnosis not present

## 2021-07-05 DIAGNOSIS — E785 Hyperlipidemia, unspecified: Secondary | ICD-10-CM | POA: Diagnosis not present

## 2021-07-05 DIAGNOSIS — I10 Essential (primary) hypertension: Secondary | ICD-10-CM | POA: Diagnosis not present

## 2021-07-05 DIAGNOSIS — I480 Paroxysmal atrial fibrillation: Secondary | ICD-10-CM | POA: Diagnosis not present

## 2021-07-05 DIAGNOSIS — Z981 Arthrodesis status: Secondary | ICD-10-CM | POA: Diagnosis not present

## 2021-07-05 DIAGNOSIS — R69 Illness, unspecified: Secondary | ICD-10-CM | POA: Diagnosis not present

## 2021-07-05 DIAGNOSIS — D62 Acute posthemorrhagic anemia: Secondary | ICD-10-CM | POA: Diagnosis not present

## 2021-07-05 DIAGNOSIS — G4733 Obstructive sleep apnea (adult) (pediatric): Secondary | ICD-10-CM | POA: Diagnosis not present

## 2021-07-05 NOTE — Telephone Encounter (Signed)
Called Friday and discussed

## 2021-07-05 NOTE — Telephone Encounter (Signed)
noted 

## 2021-07-06 DIAGNOSIS — M1990 Unspecified osteoarthritis, unspecified site: Secondary | ICD-10-CM | POA: Diagnosis not present

## 2021-07-06 DIAGNOSIS — G4733 Obstructive sleep apnea (adult) (pediatric): Secondary | ICD-10-CM | POA: Diagnosis not present

## 2021-07-06 DIAGNOSIS — I480 Paroxysmal atrial fibrillation: Secondary | ICD-10-CM | POA: Diagnosis not present

## 2021-07-06 DIAGNOSIS — Z4789 Encounter for other orthopedic aftercare: Secondary | ICD-10-CM | POA: Diagnosis not present

## 2021-07-06 DIAGNOSIS — D62 Acute posthemorrhagic anemia: Secondary | ICD-10-CM | POA: Diagnosis not present

## 2021-07-06 DIAGNOSIS — R69 Illness, unspecified: Secondary | ICD-10-CM | POA: Diagnosis not present

## 2021-07-06 DIAGNOSIS — Z981 Arthrodesis status: Secondary | ICD-10-CM | POA: Diagnosis not present

## 2021-07-06 DIAGNOSIS — G309 Alzheimer's disease, unspecified: Secondary | ICD-10-CM | POA: Diagnosis not present

## 2021-07-06 DIAGNOSIS — I10 Essential (primary) hypertension: Secondary | ICD-10-CM | POA: Diagnosis not present

## 2021-07-06 DIAGNOSIS — E785 Hyperlipidemia, unspecified: Secondary | ICD-10-CM | POA: Diagnosis not present

## 2021-07-07 DIAGNOSIS — I480 Paroxysmal atrial fibrillation: Secondary | ICD-10-CM | POA: Diagnosis not present

## 2021-07-07 DIAGNOSIS — D62 Acute posthemorrhagic anemia: Secondary | ICD-10-CM | POA: Diagnosis not present

## 2021-07-07 DIAGNOSIS — E785 Hyperlipidemia, unspecified: Secondary | ICD-10-CM | POA: Diagnosis not present

## 2021-07-07 DIAGNOSIS — M1990 Unspecified osteoarthritis, unspecified site: Secondary | ICD-10-CM | POA: Diagnosis not present

## 2021-07-07 DIAGNOSIS — G309 Alzheimer's disease, unspecified: Secondary | ICD-10-CM | POA: Diagnosis not present

## 2021-07-07 DIAGNOSIS — Z981 Arthrodesis status: Secondary | ICD-10-CM | POA: Diagnosis not present

## 2021-07-07 DIAGNOSIS — Z4789 Encounter for other orthopedic aftercare: Secondary | ICD-10-CM | POA: Diagnosis not present

## 2021-07-07 DIAGNOSIS — R69 Illness, unspecified: Secondary | ICD-10-CM | POA: Diagnosis not present

## 2021-07-07 DIAGNOSIS — G4733 Obstructive sleep apnea (adult) (pediatric): Secondary | ICD-10-CM | POA: Diagnosis not present

## 2021-07-07 DIAGNOSIS — I10 Essential (primary) hypertension: Secondary | ICD-10-CM | POA: Diagnosis not present

## 2021-07-11 DIAGNOSIS — R69 Illness, unspecified: Secondary | ICD-10-CM | POA: Diagnosis not present

## 2021-07-11 DIAGNOSIS — E785 Hyperlipidemia, unspecified: Secondary | ICD-10-CM | POA: Diagnosis not present

## 2021-07-11 DIAGNOSIS — G309 Alzheimer's disease, unspecified: Secondary | ICD-10-CM | POA: Diagnosis not present

## 2021-07-11 DIAGNOSIS — D62 Acute posthemorrhagic anemia: Secondary | ICD-10-CM | POA: Diagnosis not present

## 2021-07-11 DIAGNOSIS — Z4789 Encounter for other orthopedic aftercare: Secondary | ICD-10-CM | POA: Diagnosis not present

## 2021-07-11 DIAGNOSIS — M1990 Unspecified osteoarthritis, unspecified site: Secondary | ICD-10-CM | POA: Diagnosis not present

## 2021-07-11 DIAGNOSIS — I10 Essential (primary) hypertension: Secondary | ICD-10-CM | POA: Diagnosis not present

## 2021-07-11 DIAGNOSIS — Z981 Arthrodesis status: Secondary | ICD-10-CM | POA: Diagnosis not present

## 2021-07-11 DIAGNOSIS — G4733 Obstructive sleep apnea (adult) (pediatric): Secondary | ICD-10-CM | POA: Diagnosis not present

## 2021-07-11 DIAGNOSIS — I480 Paroxysmal atrial fibrillation: Secondary | ICD-10-CM | POA: Diagnosis not present

## 2021-07-12 DIAGNOSIS — R399 Unspecified symptoms and signs involving the genitourinary system: Secondary | ICD-10-CM | POA: Diagnosis not present

## 2021-07-12 DIAGNOSIS — N390 Urinary tract infection, site not specified: Secondary | ICD-10-CM | POA: Diagnosis not present

## 2021-07-15 DIAGNOSIS — Z4789 Encounter for other orthopedic aftercare: Secondary | ICD-10-CM | POA: Diagnosis not present

## 2021-07-15 DIAGNOSIS — M1990 Unspecified osteoarthritis, unspecified site: Secondary | ICD-10-CM | POA: Diagnosis not present

## 2021-07-15 DIAGNOSIS — G309 Alzheimer's disease, unspecified: Secondary | ICD-10-CM | POA: Diagnosis not present

## 2021-07-15 DIAGNOSIS — I480 Paroxysmal atrial fibrillation: Secondary | ICD-10-CM | POA: Diagnosis not present

## 2021-07-15 DIAGNOSIS — Z981 Arthrodesis status: Secondary | ICD-10-CM | POA: Diagnosis not present

## 2021-07-15 DIAGNOSIS — I10 Essential (primary) hypertension: Secondary | ICD-10-CM | POA: Diagnosis not present

## 2021-07-15 DIAGNOSIS — R69 Illness, unspecified: Secondary | ICD-10-CM | POA: Diagnosis not present

## 2021-07-15 DIAGNOSIS — G4733 Obstructive sleep apnea (adult) (pediatric): Secondary | ICD-10-CM | POA: Diagnosis not present

## 2021-07-15 DIAGNOSIS — E785 Hyperlipidemia, unspecified: Secondary | ICD-10-CM | POA: Diagnosis not present

## 2021-07-15 DIAGNOSIS — D62 Acute posthemorrhagic anemia: Secondary | ICD-10-CM | POA: Diagnosis not present

## 2021-07-18 DIAGNOSIS — Z4789 Encounter for other orthopedic aftercare: Secondary | ICD-10-CM | POA: Diagnosis not present

## 2021-07-18 DIAGNOSIS — D62 Acute posthemorrhagic anemia: Secondary | ICD-10-CM | POA: Diagnosis not present

## 2021-07-18 DIAGNOSIS — I480 Paroxysmal atrial fibrillation: Secondary | ICD-10-CM | POA: Diagnosis not present

## 2021-07-18 DIAGNOSIS — E785 Hyperlipidemia, unspecified: Secondary | ICD-10-CM | POA: Diagnosis not present

## 2021-07-18 DIAGNOSIS — M1990 Unspecified osteoarthritis, unspecified site: Secondary | ICD-10-CM | POA: Diagnosis not present

## 2021-07-18 DIAGNOSIS — G309 Alzheimer's disease, unspecified: Secondary | ICD-10-CM | POA: Diagnosis not present

## 2021-07-18 DIAGNOSIS — I10 Essential (primary) hypertension: Secondary | ICD-10-CM | POA: Diagnosis not present

## 2021-07-18 DIAGNOSIS — G4733 Obstructive sleep apnea (adult) (pediatric): Secondary | ICD-10-CM | POA: Diagnosis not present

## 2021-07-18 DIAGNOSIS — Z981 Arthrodesis status: Secondary | ICD-10-CM | POA: Diagnosis not present

## 2021-07-18 DIAGNOSIS — R69 Illness, unspecified: Secondary | ICD-10-CM | POA: Diagnosis not present

## 2021-07-25 DIAGNOSIS — G4733 Obstructive sleep apnea (adult) (pediatric): Secondary | ICD-10-CM

## 2021-07-27 DIAGNOSIS — E785 Hyperlipidemia, unspecified: Secondary | ICD-10-CM | POA: Diagnosis not present

## 2021-07-27 DIAGNOSIS — Z125 Encounter for screening for malignant neoplasm of prostate: Secondary | ICD-10-CM | POA: Diagnosis not present

## 2021-07-27 DIAGNOSIS — M859 Disorder of bone density and structure, unspecified: Secondary | ICD-10-CM | POA: Diagnosis not present

## 2021-07-27 DIAGNOSIS — R300 Dysuria: Secondary | ICD-10-CM | POA: Diagnosis not present

## 2021-07-28 ENCOUNTER — Telehealth: Payer: Self-pay | Admitting: Cardiology

## 2021-07-28 DIAGNOSIS — G309 Alzheimer's disease, unspecified: Secondary | ICD-10-CM | POA: Diagnosis not present

## 2021-07-28 DIAGNOSIS — Z4789 Encounter for other orthopedic aftercare: Secondary | ICD-10-CM | POA: Diagnosis not present

## 2021-07-28 DIAGNOSIS — E785 Hyperlipidemia, unspecified: Secondary | ICD-10-CM | POA: Diagnosis not present

## 2021-07-28 DIAGNOSIS — M1990 Unspecified osteoarthritis, unspecified site: Secondary | ICD-10-CM | POA: Diagnosis not present

## 2021-07-28 DIAGNOSIS — G4733 Obstructive sleep apnea (adult) (pediatric): Secondary | ICD-10-CM | POA: Diagnosis not present

## 2021-07-28 DIAGNOSIS — Z981 Arthrodesis status: Secondary | ICD-10-CM | POA: Diagnosis not present

## 2021-07-28 DIAGNOSIS — I480 Paroxysmal atrial fibrillation: Secondary | ICD-10-CM | POA: Diagnosis not present

## 2021-07-28 DIAGNOSIS — D62 Acute posthemorrhagic anemia: Secondary | ICD-10-CM | POA: Diagnosis not present

## 2021-07-28 DIAGNOSIS — R69 Illness, unspecified: Secondary | ICD-10-CM | POA: Diagnosis not present

## 2021-07-28 DIAGNOSIS — I10 Essential (primary) hypertension: Secondary | ICD-10-CM | POA: Diagnosis not present

## 2021-07-28 NOTE — Telephone Encounter (Signed)
    Pt's wife requesting to speak with Dr. Theodosia Blender nurse, she doesn't want to provide any info

## 2021-07-28 NOTE — Telephone Encounter (Signed)
Spoke with pt's wife and since hospitalization pt has not been able to get back on CPAP machine Pt's wife checking on status of mychart message if pt would qualify for [facility] Discussed with Antonieta Iba RN and is working on this message will Call pt back once answer is known on status of qualification ./cy

## 2021-07-29 DIAGNOSIS — G4733 Obstructive sleep apnea (adult) (pediatric): Secondary | ICD-10-CM | POA: Diagnosis not present

## 2021-07-29 DIAGNOSIS — E785 Hyperlipidemia, unspecified: Secondary | ICD-10-CM | POA: Diagnosis not present

## 2021-07-29 DIAGNOSIS — Z981 Arthrodesis status: Secondary | ICD-10-CM | POA: Diagnosis not present

## 2021-07-29 DIAGNOSIS — Z4789 Encounter for other orthopedic aftercare: Secondary | ICD-10-CM | POA: Diagnosis not present

## 2021-07-29 DIAGNOSIS — G309 Alzheimer's disease, unspecified: Secondary | ICD-10-CM | POA: Diagnosis not present

## 2021-07-29 DIAGNOSIS — I480 Paroxysmal atrial fibrillation: Secondary | ICD-10-CM | POA: Diagnosis not present

## 2021-07-29 DIAGNOSIS — R69 Illness, unspecified: Secondary | ICD-10-CM | POA: Diagnosis not present

## 2021-07-29 DIAGNOSIS — M1990 Unspecified osteoarthritis, unspecified site: Secondary | ICD-10-CM | POA: Diagnosis not present

## 2021-07-29 DIAGNOSIS — D62 Acute posthemorrhagic anemia: Secondary | ICD-10-CM | POA: Diagnosis not present

## 2021-07-29 DIAGNOSIS — I10 Essential (primary) hypertension: Secondary | ICD-10-CM | POA: Diagnosis not present

## 2021-08-04 DIAGNOSIS — G4733 Obstructive sleep apnea (adult) (pediatric): Secondary | ICD-10-CM | POA: Diagnosis not present

## 2021-08-04 DIAGNOSIS — Z1331 Encounter for screening for depression: Secondary | ICD-10-CM | POA: Diagnosis not present

## 2021-08-04 DIAGNOSIS — Z7901 Long term (current) use of anticoagulants: Secondary | ICD-10-CM | POA: Diagnosis not present

## 2021-08-04 DIAGNOSIS — I480 Paroxysmal atrial fibrillation: Secondary | ICD-10-CM | POA: Diagnosis not present

## 2021-08-04 DIAGNOSIS — Z0000 Encounter for general adult medical examination without abnormal findings: Secondary | ICD-10-CM | POA: Diagnosis not present

## 2021-08-04 DIAGNOSIS — M8580 Other specified disorders of bone density and structure, unspecified site: Secondary | ICD-10-CM | POA: Diagnosis not present

## 2021-08-04 DIAGNOSIS — M48061 Spinal stenosis, lumbar region without neurogenic claudication: Secondary | ICD-10-CM | POA: Diagnosis not present

## 2021-08-04 DIAGNOSIS — E785 Hyperlipidemia, unspecified: Secondary | ICD-10-CM | POA: Diagnosis not present

## 2021-08-04 DIAGNOSIS — G309 Alzheimer's disease, unspecified: Secondary | ICD-10-CM | POA: Diagnosis not present

## 2021-08-04 DIAGNOSIS — R7301 Impaired fasting glucose: Secondary | ICD-10-CM | POA: Diagnosis not present

## 2021-08-04 DIAGNOSIS — I700 Atherosclerosis of aorta: Secondary | ICD-10-CM | POA: Diagnosis not present

## 2021-08-19 DIAGNOSIS — M545 Low back pain, unspecified: Secondary | ICD-10-CM | POA: Diagnosis not present

## 2021-08-19 DIAGNOSIS — G8918 Other acute postprocedural pain: Secondary | ICD-10-CM | POA: Diagnosis not present

## 2021-08-19 DIAGNOSIS — Z981 Arthrodesis status: Secondary | ICD-10-CM | POA: Diagnosis not present

## 2021-09-06 DIAGNOSIS — M545 Low back pain, unspecified: Secondary | ICD-10-CM | POA: Diagnosis not present

## 2021-09-07 DIAGNOSIS — Z981 Arthrodesis status: Secondary | ICD-10-CM | POA: Diagnosis not present

## 2021-09-07 DIAGNOSIS — S22070S Wedge compression fracture of T9-T10 vertebra, sequela: Secondary | ICD-10-CM | POA: Diagnosis not present

## 2021-09-07 DIAGNOSIS — M532X4 Spinal instabilities, thoracic region: Secondary | ICD-10-CM | POA: Diagnosis not present

## 2021-09-07 DIAGNOSIS — S22070A Wedge compression fracture of T9-T10 vertebra, initial encounter for closed fracture: Secondary | ICD-10-CM | POA: Diagnosis not present

## 2021-09-20 DIAGNOSIS — L570 Actinic keratosis: Secondary | ICD-10-CM | POA: Diagnosis not present

## 2021-09-20 DIAGNOSIS — L814 Other melanin hyperpigmentation: Secondary | ICD-10-CM | POA: Diagnosis not present

## 2021-09-20 DIAGNOSIS — L821 Other seborrheic keratosis: Secondary | ICD-10-CM | POA: Diagnosis not present

## 2021-09-20 DIAGNOSIS — Z85828 Personal history of other malignant neoplasm of skin: Secondary | ICD-10-CM | POA: Diagnosis not present

## 2021-10-03 ENCOUNTER — Encounter: Payer: Self-pay | Admitting: Psychology

## 2021-10-03 ENCOUNTER — Other Ambulatory Visit: Payer: Self-pay

## 2021-10-03 ENCOUNTER — Encounter: Payer: Medicare HMO | Attending: Psychology | Admitting: Psychology

## 2021-10-03 DIAGNOSIS — Z820 Family history of epilepsy and other diseases of the nervous system: Secondary | ICD-10-CM | POA: Diagnosis not present

## 2021-10-03 DIAGNOSIS — G301 Alzheimer's disease with late onset: Secondary | ICD-10-CM | POA: Diagnosis not present

## 2021-10-03 DIAGNOSIS — F0280 Dementia in other diseases classified elsewhere without behavioral disturbance: Secondary | ICD-10-CM | POA: Diagnosis not present

## 2021-10-03 DIAGNOSIS — M48061 Spinal stenosis, lumbar region without neurogenic claudication: Secondary | ICD-10-CM | POA: Insufficient documentation

## 2021-10-03 DIAGNOSIS — R69 Illness, unspecified: Secondary | ICD-10-CM | POA: Diagnosis not present

## 2021-10-03 NOTE — Progress Notes (Signed)
10/03/2021: 3 PM-4 PM  Today's visit was an in person visit was conducted in my outpatient clinic office.  The patient, his wife and myself were present.  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  It was recommended that he have a follow-up evaluation 9 to 12 months after the initial evaluation for more confirmatory evidence regarding this diagnosis.  The patient had consistent subjective symptoms reported by the patient and his wife describing progressive reduction in overall cognitive and memory functions as well as objective findings of impairments regarding information processing speed and a broad range of cognitive deficits including both auditory and visual memory deficits for both immediate and delayed memory functions, poor verbal reasoning and problem-solving, reduced lexicon/vocabulary availability and reductions in his overall general fund of information.  There were visual-spatial deficits noted and difficulty with visual processing speed for more complex visual information.  Today, the patient insists that there is been no significant change over the past 9 months or so.  However, the patient's wife reports that she has noted progressive change.  She reports that there have been changes in conversational skills and he is slower getting his thoughts together and articulate what he is trying to say.  During the clinical interview there was clearly some issues with word finding and verbal fluency.  The patient's wife reports that there is definitely been a change in memory and it can be frustrating trying to wait for him to get his focus and thoughts together and process words that he is hearing.  She is noting memory changes particularly of what has been  said to him.  He still has good long-term memory.  They have not noticed any change in geographic orientation but the patient has had recent surgery and has not been driving after the surgery.  The patient had neurosurgery of his back and included fusion.  The patient reports that his sleep has been pretty good and he denied that he has not been using his BiPAP machine consistently.  However, review of medical records suggest that while he has been very compliant with his BiPAP machine he became much less compliant after his back surgery.  The patient is looking into the possibility of the inspire implantable device.  The patient reports that he has been using it more frequently and I stressed the importance of his use of his BiPAP machine every night and also using it during the day if he takes naps.  The patient reports that he has been told not to golf for 2 or 3 more months after his surgery.  He is recovering nicely from his back surgery.  We will do repeat testing and make direct comparisons to the patient's initial evaluation in 2021.  The patient will be administered the Wechsler Adult Intelligence Scale as well as the Wechsler Memory Scale's.

## 2021-10-20 ENCOUNTER — Ambulatory Visit: Payer: Medicare HMO

## 2021-10-25 ENCOUNTER — Encounter: Payer: Medicare HMO | Attending: Psychology

## 2021-10-25 ENCOUNTER — Other Ambulatory Visit: Payer: Self-pay

## 2021-10-25 DIAGNOSIS — F0280 Dementia in other diseases classified elsewhere without behavioral disturbance: Secondary | ICD-10-CM | POA: Diagnosis not present

## 2021-10-25 DIAGNOSIS — G301 Alzheimer's disease with late onset: Secondary | ICD-10-CM | POA: Insufficient documentation

## 2021-10-25 NOTE — Progress Notes (Signed)
Behavioral Observations The patient appeared well-groomed and appropriately dressed. His manners were appropriate to the situation. The patient demonstrated a negative attitude toward testing ("this is bullshit") but appeared to give good effort. The patient required frequent repetition and had significant difficulty understanding instructions. Several times throughout the test the patient did not respond when spoken to. The patient complained of back pain and reported that it interfered with his ability to concentrate. He also reported difficulty seeing testing material because of a "glare".  Neuropsychology Note  [name] completed 120 minutes of neuropsychological testing with technician, Dina Rich, BA, under the supervision of Ilean Skill, PsyD., Clinical Neuropsychologist. The patient did not appear overtly distressed by the testing session, per behavioral observation or via self-report to the technician. Rest breaks were offered.   Clinical Decision Making: In considering the patient's current level of functioning, level of presumed impairment, nature of symptoms, emotional and behavioral responses during clinical interview, level of literacy, and observed level of motivation/effort, a battery of tests was selected by Dr. Sima Matas during initial consultation on 10/03/2021. This was communicated to the technician. Communication between the neuropsychologist and technician was ongoing throughout the testing session and changes were made as deemed necessary based on patient performance on testing, technician observations and additional pertinent factors such as those listed above.  Tests Administered: Controlled Oral Word Association Test (COWAT; FAS & Animals)  Wechsler Adult Intelligence Scale, 4th Edition (WAIS-IV) Wechsler Memory Scale, 4th Edition (WMS-IV); Older Adult Battery   Results:  COWAT FAS Total = 20 Z = -1.25 Animals Total = 9 Z =  -1.72     WAIS-IV  Composite Score Summary  Scale Sum of Scaled Scores Composite Score Percentile Rank 95% Conf. Interval Qualitative Description  Verbal Comprehension 10 VCI 61 0.5 57-68 Extremely Low  Perceptual Reasoning 22 PRI 84 14 79-91 Low Average  Working Memory 14 WMI 83 13 77-91 Low Average  Processing Speed 10 PSI 74 4 68-85 Borderline  Full Scale 56 FSIQ 71 3 68-76 Borderline  General Ability 32 GAI 71 3 67-77 Borderline      Verbal Comprehension Subtests Summary  Subtest Raw Score Scaled Score Percentile Rank Reference Group Scaled Score SEM  Similarities 6 3 1 1  0.95  Vocabulary 10 4 2 4  0.67  Information 3 3 1 4  0.73  (Comprehension) 8 4 2 3  1.27       Perceptual Reasoning Subtests Summary  Subtest Raw Score Scaled Score Percentile Rank Reference Group Scaled Score SEM  Block Design 24 8 25 6  0.99  Matrix Reasoning 9 8 25 4  0.90  Visual Puzzles 6 6 9 4  0.99  (Picture Completion) 4 5 5 3  0.99       Working Doctor, general practice Raw Score Scaled Score Percentile Rank Reference Group Scaled Score SEM  Digit Span 19 7 16 5  0.73  Arithmetic 9 7 16 6  1.20       Processing Speed Subtests Summary  Subtest Raw Score Scaled Score Percentile Rank Reference Group Scaled Score SEM  Symbol Search 12 5 5 3  1.12  Coding 23 5 5 2  1.12       WMS-IV  Index Score Summary  Index Sum of Scaled Scores Index Score Percentile Rank 95% Confidence Interval Qualitative Descriptor  Auditory Memory (AMI) 7 47 <0.1 43-56 Extremely Low  Visual Memory (VMI) 5 54 0.1 50-60 Extremely Low  Immediate Memory (IMI) 9 56 0.2 52-64 Extremely Low  Delayed Memory (DMI) 3  40 <0.1 37-52 Extremely Low       Primary Subtest Scaled Score Summary  Subtest Domain Raw Score Scaled Score Percentile Rank  Logical Memory I AM 9 2 0.4  Logical Memory II AM 0 1 0.1  Verbal Paired Associates I AM 3 3 1   Verbal Paired Associates II AM 0 1 0.1  Visual Reproduction I  VM 17 4 2   Visual Reproduction II VM 0 1 0.1  Symbol Span VWM 4 4 2        Auditory Memory Process Score Summary  Process Score Raw Score Scaled Score Percentile Rank Cumulative Percentage (Base Rate)  LM II Recognition 9 - - <=2%  VPA II Recognition 14 - - <=2%        Visual Memory Process Score Summary  Process Score Raw Score Scaled Score Percentile Rank Cumulative Percentage (Base Rate)  VR II Recognition 0 - - <=2%       ABILITY-MEMORY ANALYSIS  Ability Score:  GAI: 71 Date of Testing:  WAIS-IV; WMS-IV 2021/10/25  Predicted Difference Method   Index Predicted WMS-IV Index Score Actual WMS-IV Index Score Difference Critical Value  Significant Difference Y/N Base Rate  Auditory Memory 85 47 38 10.41 Y <1%  Visual Memory 83 54 29 7.89 Y 1%  Immediate Memory 81 56 25 9.97 Y 1-2%  Delayed Memory 83 40 43 12.33 Y <1%  Statistical significance (critical value) at the .01 level.        Feedback to Patient: Quamaine Webb. will return on 04/06/2021 or sooner for an interactive feedback session with Dr. Sima Matas at which time his test performances, clinical impressions and treatment recommendations will be reviewed in detail. The patient understands he can contact our office should he require our assistance before this time.  120 minutes spent face-to-face with patient administering standardized tests, 30 minutes spent scoring Environmental education officer). [CPT [id number], [id number]]  Full report to follow.

## 2021-10-27 ENCOUNTER — Ambulatory Visit: Payer: No Typology Code available for payment source | Admitting: Neurology

## 2022-01-23 DIAGNOSIS — C44329 Squamous cell carcinoma of skin of other parts of face: Secondary | ICD-10-CM | POA: Diagnosis not present

## 2022-02-07 DIAGNOSIS — C44329 Squamous cell carcinoma of skin of other parts of face: Secondary | ICD-10-CM | POA: Diagnosis not present

## 2022-02-22 ENCOUNTER — Other Ambulatory Visit: Payer: Self-pay

## 2022-02-22 ENCOUNTER — Encounter: Payer: Medicare HMO | Attending: Psychology | Admitting: Psychology

## 2022-02-22 ENCOUNTER — Encounter: Payer: Self-pay | Admitting: Psychology

## 2022-02-22 DIAGNOSIS — F0280 Dementia in other diseases classified elsewhere without behavioral disturbance: Secondary | ICD-10-CM | POA: Insufficient documentation

## 2022-02-22 DIAGNOSIS — Z820 Family history of epilepsy and other diseases of the nervous system: Secondary | ICD-10-CM | POA: Diagnosis not present

## 2022-02-22 DIAGNOSIS — G301 Alzheimer's disease with late onset: Secondary | ICD-10-CM | POA: Diagnosis not present

## 2022-02-22 DIAGNOSIS — G894 Chronic pain syndrome: Secondary | ICD-10-CM | POA: Insufficient documentation

## 2022-02-22 NOTE — Progress Notes (Signed)
Neuropsychological Evaluation   Patient:  Alan Knight.   DOB: 1946/02/23  MR Number: [id number]  Location: [place] [facility] PHYSICAL MEDICINE AND REHABILITATION [place], [place] [id number] [place] [id number] Dept: [phone]  Start: 11 AM End: 12 PM  Provider/Observer:     Edgardo Roys PsyD  Chief Complaint:      Chief Complaint  Patient presents with   Memory Loss    Reason For Service:     Reid Regas. Jasraj, Lappe. is a 76 year old male referred by Sarina Ill MD with [facility] for a neuropsychological evaluation because of complaints of slowly progressive memory changes over the past several years.  The patient was initially referred for neurological evaluation by the patient is primary care physician Reynold Bowen, MD. I initially saw the patient back in the summer 2021 for neuropsychological evaluation and at that time both the patient and wife reported progressing memory deficits with his wife reporting she started noticing issues as much as 10 years prior.  There was a family history of Alzheimer's.  Full clinical history can be found in the patient's EMR dated 05/27/2020 and I will not repeat all of the information contained there here.  I have also included the results from the initial evaluation below for convenience with italicized an underlying to did note that this was the results from earlier evaluation.  The complete neuropsychological evaluation can be found in his EMR dated 08/09/2020   Initial Neuropsychological Evaluation:   Impression/Diagnosis:                     Overall, the results of the current neuropsychological evaluation are consistent with patterns typically seen with cortically based progressive dementia.  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  The patient does not show any focal areas of cortical atrophy or indications of prior cerebrovascular events.  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  However, the pattern of overall information processing speed measures as well as the pattern of strengths and weaknesses on a broad range of cognitive domains do not suggest that the patient's deficits are simply related to typical cerebrovascular changes with age.  The patient has no reports of tremor or hallucination/delusions but does have a family history of Alzheimer's.  The patient also has a history of significant chronic pain but the level of memory deficits and other cognitive deficits in the pattern of these deficits could not be explained simply because of significant chronic pain or simply attributed to microvascular ischemic changes.  The patient returns for follow-up repeat testing and I saw him for formal clinical interview on 10/03/2021.  During this interview in October the patient insisted that he did not perceive any significant change in his cognitive functioning or memory over the past 9 months or so.  However, the patient's wife reported that she has noticed progressive change in further worsening of conversational skills and expressive language, being slower getting his thoughts together and articulating what  he is trying to say.  During the clinical interview I observed clear symptoms consistent with word finding difficulties and changes in verbal fluency.  Patient's wife also reported that she  was definitely noticing changes in his memory and that they both become frustrated waiting for him to try to get his focus and thoughts together.  She noted that the patient still has good long-term memory but does not identify any changes in geographic orientation.  Patient has had recent neurosurgery on his back including spinal fusion.  Review of medical records had suggested that the patient has not been very compliant with his BiPAP machine after his back surgery even though the patient insists that he has been pretty good with compliancy.  At that time I stressed the importance for him to continue to use his BiPAP machine at night and even use it during the day when he takes naps.  Tests Administered: Controlled Oral Word Association Test (COWAT; FAS & Animals)  Wechsler Adult Intelligence Scale, 4th Edition (WAIS-IV) Wechsler Memory Scale, 4th Edition (WMS-IV); Older Adult Battery   Participation Level:   Active  Participation Quality:  Inattentive      Behavioral Observation:  The patient appeared well-groomed and appropriately dressed. His manners were appropriate to the situation. The patient demonstrated a negative attitude toward testing ("this is bullshit") but appeared to give good effort. The patient required frequent repetition and had significant difficulty understanding instructions. Several times throughout the test the patient did not respond when spoken to. The patient complained of back pain and reported that it interfered with his ability to concentrate. He also reported difficulty seeing testing material because of a "glare".  Well Groomed, Alert, and Appropriate.   Test Results:    Initially, an estimation was made as to historical/premorbid intellectual and cognitive functioning.  Taking into account various psychosocial measures such as educational and occupational history as well as hobbies and interest it is estimated that the patient likely performed in the average to  high average range relative to a normative population historically.  We will utilize standardized T score measures of 110 as a general estimation and scaled score measures of 10 for conservative estimations for historical/premorbid functioning.  This is repeat testing and I will designate above each set of test data whether it was derived from the 2021 evaluation or the 2022 evaluation  2022:  COWAT FAS Total = 20 Z = -1.25 Animals Total = 9 Z = -1.72   While during the first evaluation the patient was not administered expressive language and verbal fluency testing as he had not specifically addressed this is being a major problem this was highlighted during the most recent clinical evaluation and thus was added for assessment.  The patient was administered the control oral Word Association test.  On the FAS test which assesses lexical fluency, the patient showed mild to moderate deficits with regard to language production of self generated words.  On the somatic fluency test (animal naming) the patient showed a similar pattern for targeted words.  Both of these following the mild to moderate range of deficits and are consistent with subjective reports of changing efficiency and verbal fluency and difficulties organizing his thoughts for efficient expressive language functions.     WAIS-IV   2021:  Composite Score Summary   Scale Sum of Scaled Scores Composite Score Percentile Rank 95% Conf. Interval Qualitative Description  Verbal Comprehension 20 VCI 81 10 76-87 Low Average  Perceptual Reasoning 27 PRI 94 34  88-101 Average  Working Memory 17 WMI 92 30 86-99 Average  Processing Speed 12 PSI 79 8 73-89 Borderline  Full Scale 76 FSIQ 83 13 79-87 Low Average  General Ability 47 GAI 86 18 81-91 Low Average   2022:            Composite Score Summary         Scale Sum of Scaled Scores Composite Score Percentile Rank 95% Conf. Interval Qualitative Description  Verbal Comprehension  10 VCI 61 0.5 57-68 Extremely Low  Perceptual Reasoning 22 PRI 84 14 79-91 Low Average  Working Memory 14 WMI 83 13 77-91 Low Average  Processing Speed 10 PSI 74 4 68-85 Borderline  Full Scale 56 FSIQ 71 3 68-76 Borderline  General Ability 32 GAI 71 3 67-77 Borderline      Direct comparisons between the 2021 evaluation and the 2022 evaluation do demonstrate significant changes over the past year roughly.  Most notable changes were seen in the areas of verbal comprehension with the most significant and profound changes related to increasing deficits for verbal reasoning and problem-solving, vocabulary knowledge and general fund of information.  2021:   Verbal Comprehension Subtests Summary   Subtest Raw Score Scaled Score Percentile Rank Reference Group Scaled Score SEM  Similarities 16 7 16 5  0.95  Vocabulary 20 6 9 6  0.67  Information 9 7 16 8  0.73   2022:          Verbal Comprehension Subtests Summary      Subtest Raw Score Scaled Score Percentile Rank Reference Group Scaled Score SEM  Similarities 6 3 1 1  0.95  Vocabulary 10 4 2 4  0.67  Information 3 3 1 4  0.73  (Comprehension) 8 4 2 3  1.27          2021:  Perceptual Reasoning Subtests Summary   Subtest Raw Score Scaled Score Percentile Rank Reference Group Scaled Score SEM  Block Design 32 11 63 7 0.99  Matrix Reasoning 10 9 37 5 0.90  Visual Puzzles 7 7 16 5  0.99    2022:             Perceptual Reasoning Subtests Summary     Subtest Raw Score Scaled Score Percentile Rank Reference Group Scaled Score SEM  Block Design 24 8 25 6  0.99  Matrix Reasoning 9 8 25 4  0.90  Visual Puzzles 6 6 9 4  0.99  (Picture Completion) 4 5 5 3  0.99    On the patient's visual spatial/perceptual reasoning subtest there also consistent changes but to a lesser degree.  The patient is still at the lower end of the average range on measures of visual analysis and organization and visual reasoning and problem-solving.  The patient is  having greater difficulties with regard to his ability to identify visual anomalies within a visual gestalt but overall visual spatial capacity has declined but is being more well-maintained in other areas of cognitive functioning.   2021:  Working Print production planner Raw Score Scaled Score Percentile Rank Reference Group Scaled Score SEM  Digit Span 22 9 37 7 0.73  Arithmetic 11 8 25 8  1.20   2022:           Working Hydrographic surveyor Raw Score Scaled Score Percentile Rank Reference Group Scaled Score SEM  Digit Span 19 7 16 5  0.73  Arithmetic 9 7 16 6  1.20      There are also some mild and  subtle changes with regard to auditory encoding measures but these are subtle and not significant changes for the most part but they do represent some loss in auditory encoding capacity.  2021:  Processing Speed Subtests Summary   Subtest Raw Score Scaled Score Percentile Rank Reference Group Scaled Score SEM  Symbol Search 8 3 1 1  1.12  Coding 44 9 37 5 1.12   2022:            Processing Speed Subtests Summary      Subtest Raw Score Scaled Score Percentile Rank Reference Group Scaled Score SEM  Symbol Search 12 5 5 3  1.12  Coding 23 5 5 2  1.12      The patient is showing some greater variability within subtest for information processing speed and actually had 1 measure where he did relatively well and better than his initial performance.  However, overall there is a slowed ability for information processing speed overall with some greater variability in the subtest.     WMS-IV  2021:  Index Score Summary   Index Sum of Scaled Scores Index Score Percentile Rank 95% Confidence Interval Qualitative Descriptor  Auditory Memory (AMI) 13 58 0.3 54-66 Extremely Low  Visual Memory (VMI) 5 54 0.1 50-60 Extremely Low  Immediate Memory (IMI) 12 63 1 59-71 Extremely Low  Delayed Memory (DMI) 6 49 <0.1 45-61 Extremely Low    2022:           Index Score  Summary        Index Sum of Scaled Scores Index Score Percentile Rank 95% Confidence Interval Qualitative Descriptor  Auditory Memory (AMI) 7 47 <0.1 43-56 Extremely Low  Visual Memory (VMI) 5 54 0.1 50-60 Extremely Low  Immediate Memory (IMI) 9 56 0.2 52-64 Extremely Low  Delayed Memory (DMI) 3 40 <0.1 37-52 Extremely Low    With the initial evaluation the difference between overall cognitive functioning and memory functions was quite dramatic with significant deficits in both auditory and visual memory noted at the time.  The patient had significant loss of information over a period of delay even though he had significant difficulty initially stored and organized information it progressively worsened and lost considerable detail with delay.  There was no significant improvement under delayed/recognition formats.  With the current assessment compared to previous assessment this is also the area that appears to be showing the greatest loss of function along with language-based skills including fluency and verbal comprehension skills.  The patient is showing consistent and significant/severe deficits with regard to auditory and visual memory and further loss or difficulties initially storing and organizing information and even more significant loss of information over a period of delay.  There was no significant improvement under cued/recognition formats   Summary of Results:   The results of the current neuropsychological evaluation comparing his performances in 2021 with his performances at the latter end of 2022 do suggest an document progression in symptoms.  While the patient denies any significant changes his wife reports that she is noticing a steady and progressive decline in a number of cognitive domains.  The formal repeat testing show a further decline in global cognitive functioning with specific deficits in most profound changes with regard to verbal comprehension functions some progression  and information processing speed and visual-spatial abilities and some changes in encoding capacity.  The most significant profound changes of further deterioration in memory and learning.  These memory deficits and progressive loss are consistent between auditory and visual  memory and learning and the patient is having even more significant profound loss of information after period of delay.  These were not directly related to retrieval deficits and his encoding capacity was only mildly impaired and would suggest he should be able to store and organize new information with a much greater capacity then was identified on this assessment.  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  Taking into account the subjective symptoms reported by the patient and family members describing a progressive reduction in overall cognitive functioning and worsening expressive language functioning along with past medical history and neuroimaging findings all point to a continuation of the formal diagnosis of dementia of the Alzheimer's type late onset without behavioral disturbance.  The greatest progression and objective cognitive functioning deficits were around specific memory deficits both visual and auditory with greatest impairments on delayed recall.  While the patient is showing milder weaknesses for initially encoding either visual or auditory information he has significant difficulty storing and organizing new information and the limited amount of information that is effectively stored and organized is quickly lost as a function of time and by as little as 20 minutes after presentation of information little if any of that information is retained.  There are no new symptoms that would point against this diagnosis and there does appear to be consistent  and progressive changes taking place consistent with a late onset.  He may have been having symptoms as much as 11 or 12 years now so it does appear to be a relatively slow progression but he is continuing to progressively lose cognitive and memory functions.  The patient is continuing to take Aricept and Namenda and these may be helping him maintain his function is much as possible but even with these efforts he is continuing to show progressive decline.  As far as recommendations, it is essential that the patient continue to use his BiPAP device and be as consistent using it as possible including times when he may be taking naps.  Continuing good nutritional patterns and physical activities will also be important and remaining as socially and physically active as possible.  However, he is very likely to continue to decline neurologically.  It does appear that the patient has continued to drive and while visual spatial functioning is declining less rapidly than his memory functions they are getting to the point where driving should be curtailed.  There will be increasing risk as he continues to decline for driving functions and at this point he is at a level of visual spatial and cognitive deficits where driving will be increasingly risky.  The patient had stopped after his back surgery but I suspect he will be motivated to return to driving and this should be directly addressed.  I will sit down with the patient and his family and go over the results of the current evaluation and talk about some of the recommendations going forward particularly with regard to his long-term care and issues around safety.  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 Clinical Neuropsychologist

## 2022-03-02 DIAGNOSIS — G1221 Amyotrophic lateral sclerosis: Secondary | ICD-10-CM | POA: Diagnosis not present

## 2022-03-02 DIAGNOSIS — R278 Other lack of coordination: Secondary | ICD-10-CM | POA: Diagnosis not present

## 2022-03-02 DIAGNOSIS — M6259 Muscle wasting and atrophy, not elsewhere classified, multiple sites: Secondary | ICD-10-CM | POA: Diagnosis not present

## 2022-03-07 DIAGNOSIS — M6259 Muscle wasting and atrophy, not elsewhere classified, multiple sites: Secondary | ICD-10-CM | POA: Diagnosis not present

## 2022-03-07 DIAGNOSIS — R278 Other lack of coordination: Secondary | ICD-10-CM | POA: Diagnosis not present

## 2022-03-07 DIAGNOSIS — G1221 Amyotrophic lateral sclerosis: Secondary | ICD-10-CM | POA: Diagnosis not present

## 2022-03-09 DIAGNOSIS — R278 Other lack of coordination: Secondary | ICD-10-CM | POA: Diagnosis not present

## 2022-03-09 DIAGNOSIS — M6259 Muscle wasting and atrophy, not elsewhere classified, multiple sites: Secondary | ICD-10-CM | POA: Diagnosis not present

## 2022-03-09 DIAGNOSIS — G1221 Amyotrophic lateral sclerosis: Secondary | ICD-10-CM | POA: Diagnosis not present

## 2022-03-14 DIAGNOSIS — G1221 Amyotrophic lateral sclerosis: Secondary | ICD-10-CM | POA: Diagnosis not present

## 2022-03-14 DIAGNOSIS — M6259 Muscle wasting and atrophy, not elsewhere classified, multiple sites: Secondary | ICD-10-CM | POA: Diagnosis not present

## 2022-03-14 DIAGNOSIS — R278 Other lack of coordination: Secondary | ICD-10-CM | POA: Diagnosis not present

## 2022-03-15 DIAGNOSIS — B351 Tinea unguium: Secondary | ICD-10-CM | POA: Diagnosis not present

## 2022-03-15 DIAGNOSIS — L578 Other skin changes due to chronic exposure to nonionizing radiation: Secondary | ICD-10-CM | POA: Diagnosis not present

## 2022-03-15 DIAGNOSIS — X32XXXA Exposure to sunlight, initial encounter: Secondary | ICD-10-CM | POA: Diagnosis not present

## 2022-03-15 DIAGNOSIS — L853 Xerosis cutis: Secondary | ICD-10-CM | POA: Diagnosis not present

## 2022-03-15 DIAGNOSIS — L814 Other melanin hyperpigmentation: Secondary | ICD-10-CM | POA: Diagnosis not present

## 2022-03-15 DIAGNOSIS — L821 Other seborrheic keratosis: Secondary | ICD-10-CM | POA: Diagnosis not present

## 2022-03-15 DIAGNOSIS — C44612 Basal cell carcinoma of skin of right upper limb, including shoulder: Secondary | ICD-10-CM | POA: Diagnosis not present

## 2022-03-15 DIAGNOSIS — R202 Paresthesia of skin: Secondary | ICD-10-CM | POA: Diagnosis not present

## 2022-03-15 DIAGNOSIS — C44622 Squamous cell carcinoma of skin of right upper limb, including shoulder: Secondary | ICD-10-CM | POA: Diagnosis not present

## 2022-03-15 DIAGNOSIS — L820 Inflamed seborrheic keratosis: Secondary | ICD-10-CM | POA: Diagnosis not present

## 2022-03-15 DIAGNOSIS — D225 Melanocytic nevi of trunk: Secondary | ICD-10-CM | POA: Diagnosis not present

## 2022-03-15 DIAGNOSIS — C44712 Basal cell carcinoma of skin of right lower limb, including hip: Secondary | ICD-10-CM | POA: Diagnosis not present

## 2022-03-15 DIAGNOSIS — L728 Other follicular cysts of the skin and subcutaneous tissue: Secondary | ICD-10-CM | POA: Diagnosis not present

## 2022-03-15 DIAGNOSIS — L570 Actinic keratosis: Secondary | ICD-10-CM | POA: Diagnosis not present

## 2022-03-15 DIAGNOSIS — D692 Other nonthrombocytopenic purpura: Secondary | ICD-10-CM | POA: Diagnosis not present

## 2022-03-15 DIAGNOSIS — D485 Neoplasm of uncertain behavior of skin: Secondary | ICD-10-CM | POA: Diagnosis not present

## 2022-03-16 DIAGNOSIS — G1221 Amyotrophic lateral sclerosis: Secondary | ICD-10-CM | POA: Diagnosis not present

## 2022-03-16 DIAGNOSIS — M6259 Muscle wasting and atrophy, not elsewhere classified, multiple sites: Secondary | ICD-10-CM | POA: Diagnosis not present

## 2022-03-16 DIAGNOSIS — R278 Other lack of coordination: Secondary | ICD-10-CM | POA: Diagnosis not present

## 2022-03-27 DIAGNOSIS — G1221 Amyotrophic lateral sclerosis: Secondary | ICD-10-CM | POA: Diagnosis not present

## 2022-03-27 DIAGNOSIS — M6259 Muscle wasting and atrophy, not elsewhere classified, multiple sites: Secondary | ICD-10-CM | POA: Diagnosis not present

## 2022-03-27 DIAGNOSIS — R278 Other lack of coordination: Secondary | ICD-10-CM | POA: Diagnosis not present

## 2022-03-27 DIAGNOSIS — G309 Alzheimer's disease, unspecified: Secondary | ICD-10-CM | POA: Diagnosis not present

## 2022-03-29 DIAGNOSIS — G309 Alzheimer's disease, unspecified: Secondary | ICD-10-CM | POA: Diagnosis not present

## 2022-03-29 DIAGNOSIS — M6259 Muscle wasting and atrophy, not elsewhere classified, multiple sites: Secondary | ICD-10-CM | POA: Diagnosis not present

## 2022-03-29 DIAGNOSIS — G1221 Amyotrophic lateral sclerosis: Secondary | ICD-10-CM | POA: Diagnosis not present

## 2022-03-29 DIAGNOSIS — R278 Other lack of coordination: Secondary | ICD-10-CM | POA: Diagnosis not present

## 2022-03-30 ENCOUNTER — Ambulatory Visit: Payer: Medicare HMO | Admitting: Psychology

## 2022-04-04 DIAGNOSIS — X32XXXA Exposure to sunlight, initial encounter: Secondary | ICD-10-CM | POA: Diagnosis not present

## 2022-04-04 DIAGNOSIS — L570 Actinic keratosis: Secondary | ICD-10-CM | POA: Diagnosis not present

## 2022-04-05 ENCOUNTER — Encounter: Payer: Medicare HMO | Attending: Psychology | Admitting: Psychology

## 2022-04-05 ENCOUNTER — Encounter: Payer: Self-pay | Admitting: Psychology

## 2022-04-05 DIAGNOSIS — Z820 Family history of epilepsy and other diseases of the nervous system: Secondary | ICD-10-CM | POA: Diagnosis not present

## 2022-04-05 DIAGNOSIS — G894 Chronic pain syndrome: Secondary | ICD-10-CM | POA: Diagnosis not present

## 2022-04-05 DIAGNOSIS — G301 Alzheimer's disease with late onset: Secondary | ICD-10-CM | POA: Diagnosis not present

## 2022-04-05 DIAGNOSIS — F0280 Dementia in other diseases classified elsewhere without behavioral disturbance: Secondary | ICD-10-CM | POA: Diagnosis not present

## 2022-04-05 NOTE — Progress Notes (Signed)
04/05/2022 11 AM-12 PM: Today I provided feedback regarding the results of the recent repeat neuropsychological evaluation.  The patient and his wife were present for this feedback session.  We reviewed the results of the most recent evaluation with reference and comparisons to the evaluation done in 2021.  I have included a copy of the summary and diagnosis sections of my report below for convenience and is complete neuropsychological evaluation can be found in his EMR dated 02/22/2022.  The patient has shown significant progression in his memory loss and difficulties which are consistent with reports of his wife.  His wife today reports that she is having to do more to help him cope and manage.  The patient [place] with significant help in maintenance for assisted living if needed going forward.  The patient had significantly reduced his driving and it stopped for a period after his back surgery but is continuing to drive short distances but his wife is taken over the vast majority of his driving.  As a result of the more definitive diagnostic picture and concerns about further progression today's visit included my strong recommendation that the patient completely stop driving.  Expectedly, this was a difficult discussion to happen the patient was very frustrated with this suggestion reporting that he had not had any accidents in his adult life.  However, the progression of his cognitive difficulties and the likelihood that it is related to an Alzheimer's condition suggest that he will continue to progress and his memory functions currently are severely impaired.  It was clear from the interaction between his wife and the patient that these difficulties are aware and clear to both of them.  The patient did agree to stop completely stopped driving but had questions about using a golf cart around the community and the patient is having fair maintenance of his visual spatial  capacity.  I cautioned him that this should be done with care and we went over the issues of the likely continuation of worsening symptoms. ? ? ?Summary of Results:                        The results of the current neuropsychological evaluation comparing his performances in 2021 with his performances at the latter end of 2022 do suggest an document progression in symptoms.  While the patient denies any significant changes his wife reports that she is noticing a steady and progressive decline in a number of cognitive domains.  The formal repeat testing show a further decline in global cognitive functioning with specific deficits in most profound changes with regard to verbal comprehension functions some progression and information processing speed and visual-spatial abilities and some changes in encoding capacity.  The most significant profound changes of further deterioration in memory and learning.  These memory deficits and progressive loss are consistent between auditory and visual memory and learning and the patient is having even more significant profound loss of information after period of delay.  These were not directly related to retrieval deficits and his encoding capacity was only mildly impaired and would suggest he should be able to store and organize new information with a much greater capacity then was identified on this assessment. ?  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  Taking into account the subjective symptoms reported by the patient and family members describing a progressive reduction in overall cognitive functioning and worsening expressive language functioning along with past medical history and neuroimaging findings all point to a continuation of the formal diagnosis of dementia of the  Alzheimer's type late onset without behavioral disturbance.  The greatest progression and objective cognitive functioning deficits were around specific memory deficits both visual and auditory with greatest impairments on delayed recall.  While the patient is showing milder weaknesses for initially encoding either visual or auditory information he has significant difficulty storing and organizing new information and the limited amount of information that is effectively stored and organized is quickly lost as a function of time and by as little as 20 minutes after presentation of information little if any of that information is retained.  There are no new symptoms that would point against this diagnosis and there does appear to be consistent and progressive changes taking place consistent with a late onset.  He may have been having symptoms as much as 11 or 12 years now so it does appear to be a relatively slow progression but he is continuing to progressively lose cognitive and memory functions.  The patient is continuing to take Aricept and Namenda and these may be helping him maintain his function is much as possible but even with these efforts he is continuing to show progressive decline. ? ?As far as recommendations, it is essential that the patient continue to use his BiPAP device and be as consistent using it as possible including times when he may be taking naps.  Continuing good nutritional patterns and physical activities will also be important and remaining as socially and physically active as possible.  However, he is very likely to continue to decline neurologically.  It does appear that the patient has continued to drive and while visual spatial functioning is declining less rapidly than his memory functions they are getting to the point where driving should be curtailed.  There will be increasing risk as he continues to decline for driving functions and at this point he is at a level of visual spatial  and cognitive deficits where driving will be increasingly risky.  The patient had stopped after his back surgery but I suspect he will be motivated to return to driving and this should be directly addressed. ? ?I will sit down with the patient and his family and go over the results of the current evaluation and talk about some of the recommendations going forward particularly with regard to his long-term care and issues around safety. ?  ?Diagnosis:                              ?  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  ?Family history of Alzheimer's disease ?  ?Chronic pain syndrome ?  ?  ?_____________________ ?Ilean Skill, Psy.D. ?Clinical Neuropsychologist ? ? ? ?

## 2022-04-06 ENCOUNTER — Encounter: Payer: Medicare HMO | Admitting: Psychology

## 2022-04-20 DIAGNOSIS — C44712 Basal cell carcinoma of skin of right lower limb, including hip: Secondary | ICD-10-CM | POA: Diagnosis not present

## 2022-04-20 DIAGNOSIS — C44612 Basal cell carcinoma of skin of right upper limb, including shoulder: Secondary | ICD-10-CM | POA: Diagnosis not present

## 2022-04-20 DIAGNOSIS — C44622 Squamous cell carcinoma of skin of right upper limb, including shoulder: Secondary | ICD-10-CM | POA: Diagnosis not present

## 2022-06-01 DIAGNOSIS — C44712 Basal cell carcinoma of skin of right lower limb, including hip: Secondary | ICD-10-CM | POA: Diagnosis not present

## 2022-06-01 DIAGNOSIS — C44622 Squamous cell carcinoma of skin of right upper limb, including shoulder: Secondary | ICD-10-CM | POA: Diagnosis not present

## 2022-06-21 DIAGNOSIS — Z883 Allergy status to other anti-infective agents status: Secondary | ICD-10-CM | POA: Diagnosis not present

## 2022-06-21 DIAGNOSIS — M65332 Trigger finger, left middle finger: Secondary | ICD-10-CM | POA: Diagnosis not present

## 2022-06-21 DIAGNOSIS — G301 Alzheimer's disease with late onset: Secondary | ICD-10-CM | POA: Diagnosis not present

## 2022-06-21 DIAGNOSIS — R69 Illness, unspecified: Secondary | ICD-10-CM | POA: Diagnosis not present

## 2022-06-21 DIAGNOSIS — M65342 Trigger finger, left ring finger: Secondary | ICD-10-CM | POA: Diagnosis not present

## 2022-06-21 DIAGNOSIS — M65331 Trigger finger, right middle finger: Secondary | ICD-10-CM | POA: Diagnosis not present

## 2022-07-21 DIAGNOSIS — R69 Illness, unspecified: Secondary | ICD-10-CM | POA: Diagnosis not present

## 2022-07-21 DIAGNOSIS — Z79899 Other long term (current) drug therapy: Secondary | ICD-10-CM | POA: Diagnosis not present

## 2022-07-21 DIAGNOSIS — G301 Alzheimer's disease with late onset: Secondary | ICD-10-CM | POA: Diagnosis not present

## 2022-07-21 DIAGNOSIS — G309 Alzheimer's disease, unspecified: Secondary | ICD-10-CM | POA: Diagnosis not present

## 2022-08-02 DIAGNOSIS — C44712 Basal cell carcinoma of skin of right lower limb, including hip: Secondary | ICD-10-CM | POA: Diagnosis not present

## 2022-08-02 DIAGNOSIS — L821 Other seborrheic keratosis: Secondary | ICD-10-CM | POA: Diagnosis not present

## 2022-08-09 DIAGNOSIS — R7301 Impaired fasting glucose: Secondary | ICD-10-CM | POA: Diagnosis not present

## 2022-08-09 DIAGNOSIS — Z0000 Encounter for general adult medical examination without abnormal findings: Secondary | ICD-10-CM | POA: Diagnosis not present

## 2022-08-09 DIAGNOSIS — Z125 Encounter for screening for malignant neoplasm of prostate: Secondary | ICD-10-CM | POA: Diagnosis not present

## 2022-08-09 DIAGNOSIS — M8580 Other specified disorders of bone density and structure, unspecified site: Secondary | ICD-10-CM | POA: Diagnosis not present

## 2022-08-09 DIAGNOSIS — E785 Hyperlipidemia, unspecified: Secondary | ICD-10-CM | POA: Diagnosis not present

## 2022-08-09 DIAGNOSIS — R7989 Other specified abnormal findings of blood chemistry: Secondary | ICD-10-CM | POA: Diagnosis not present

## 2022-08-16 DIAGNOSIS — Z1339 Encounter for screening examination for other mental health and behavioral disorders: Secondary | ICD-10-CM | POA: Diagnosis not present

## 2022-08-16 DIAGNOSIS — G43909 Migraine, unspecified, not intractable, without status migrainosus: Secondary | ICD-10-CM | POA: Diagnosis not present

## 2022-08-16 DIAGNOSIS — M8580 Other specified disorders of bone density and structure, unspecified site: Secondary | ICD-10-CM | POA: Diagnosis not present

## 2022-08-16 DIAGNOSIS — Z1331 Encounter for screening for depression: Secondary | ICD-10-CM | POA: Diagnosis not present

## 2022-08-16 DIAGNOSIS — G309 Alzheimer's disease, unspecified: Secondary | ICD-10-CM | POA: Diagnosis not present

## 2022-08-16 DIAGNOSIS — Z0000 Encounter for general adult medical examination without abnormal findings: Secondary | ICD-10-CM | POA: Diagnosis not present

## 2022-08-16 DIAGNOSIS — I2510 Atherosclerotic heart disease of native coronary artery without angina pectoris: Secondary | ICD-10-CM | POA: Diagnosis not present

## 2022-08-16 DIAGNOSIS — G4733 Obstructive sleep apnea (adult) (pediatric): Secondary | ICD-10-CM | POA: Diagnosis not present

## 2022-08-16 DIAGNOSIS — I480 Paroxysmal atrial fibrillation: Secondary | ICD-10-CM | POA: Diagnosis not present

## 2022-08-16 DIAGNOSIS — G894 Chronic pain syndrome: Secondary | ICD-10-CM | POA: Diagnosis not present

## 2022-08-16 DIAGNOSIS — M48061 Spinal stenosis, lumbar region without neurogenic claudication: Secondary | ICD-10-CM | POA: Diagnosis not present

## 2022-08-16 DIAGNOSIS — Z7901 Long term (current) use of anticoagulants: Secondary | ICD-10-CM | POA: Diagnosis not present

## 2022-08-16 DIAGNOSIS — R82998 Other abnormal findings in urine: Secondary | ICD-10-CM | POA: Diagnosis not present

## 2022-08-16 DIAGNOSIS — I700 Atherosclerosis of aorta: Secondary | ICD-10-CM | POA: Diagnosis not present

## 2022-08-16 DIAGNOSIS — E785 Hyperlipidemia, unspecified: Secondary | ICD-10-CM | POA: Diagnosis not present

## 2022-10-03 DIAGNOSIS — Z85828 Personal history of other malignant neoplasm of skin: Secondary | ICD-10-CM | POA: Diagnosis not present

## 2022-10-03 DIAGNOSIS — C44729 Squamous cell carcinoma of skin of left lower limb, including hip: Secondary | ICD-10-CM | POA: Diagnosis not present

## 2022-10-03 DIAGNOSIS — Z08 Encounter for follow-up examination after completed treatment for malignant neoplasm: Secondary | ICD-10-CM | POA: Diagnosis not present

## 2022-10-03 DIAGNOSIS — L905 Scar conditions and fibrosis of skin: Secondary | ICD-10-CM | POA: Diagnosis not present

## 2022-10-03 DIAGNOSIS — D485 Neoplasm of uncertain behavior of skin: Secondary | ICD-10-CM | POA: Diagnosis not present

## 2022-10-03 DIAGNOSIS — C44712 Basal cell carcinoma of skin of right lower limb, including hip: Secondary | ICD-10-CM | POA: Diagnosis not present

## 2022-10-03 DIAGNOSIS — L728 Other follicular cysts of the skin and subcutaneous tissue: Secondary | ICD-10-CM | POA: Diagnosis not present

## 2023-01-05 DIAGNOSIS — Z79899 Other long term (current) drug therapy: Secondary | ICD-10-CM | POA: Diagnosis not present

## 2023-01-05 DIAGNOSIS — M48061 Spinal stenosis, lumbar region without neurogenic claudication: Secondary | ICD-10-CM | POA: Diagnosis not present

## 2023-01-05 DIAGNOSIS — M6289 Other specified disorders of muscle: Secondary | ICD-10-CM | POA: Diagnosis not present

## 2023-01-05 DIAGNOSIS — M5416 Radiculopathy, lumbar region: Secondary | ICD-10-CM | POA: Diagnosis not present

## 2023-01-05 DIAGNOSIS — G894 Chronic pain syndrome: Secondary | ICD-10-CM | POA: Diagnosis not present

## 2023-01-05 DIAGNOSIS — M7918 Myalgia, other site: Secondary | ICD-10-CM | POA: Diagnosis not present

## 2023-01-10 DIAGNOSIS — M65342 Trigger finger, left ring finger: Secondary | ICD-10-CM | POA: Diagnosis not present

## 2023-01-10 DIAGNOSIS — M65331 Trigger finger, right middle finger: Secondary | ICD-10-CM | POA: Diagnosis not present

## 2023-05-22 DIAGNOSIS — C44729 Squamous cell carcinoma of skin of left lower limb, including hip: Secondary | ICD-10-CM | POA: Diagnosis not present

## 2023-05-22 DIAGNOSIS — Z08 Encounter for follow-up examination after completed treatment for malignant neoplasm: Secondary | ICD-10-CM | POA: Diagnosis not present

## 2023-05-22 DIAGNOSIS — L814 Other melanin hyperpigmentation: Secondary | ICD-10-CM | POA: Diagnosis not present

## 2023-05-22 DIAGNOSIS — D485 Neoplasm of uncertain behavior of skin: Secondary | ICD-10-CM | POA: Diagnosis not present

## 2023-05-22 DIAGNOSIS — Z85828 Personal history of other malignant neoplasm of skin: Secondary | ICD-10-CM | POA: Diagnosis not present

## 2023-05-22 DIAGNOSIS — D0462 Carcinoma in situ of skin of left upper limb, including shoulder: Secondary | ICD-10-CM | POA: Diagnosis not present

## 2023-05-22 DIAGNOSIS — L578 Other skin changes due to chronic exposure to nonionizing radiation: Secondary | ICD-10-CM | POA: Diagnosis not present

## 2023-05-22 DIAGNOSIS — C44629 Squamous cell carcinoma of skin of left upper limb, including shoulder: Secondary | ICD-10-CM | POA: Diagnosis not present

## 2023-05-22 DIAGNOSIS — L821 Other seborrheic keratosis: Secondary | ICD-10-CM | POA: Diagnosis not present

## 2023-05-22 DIAGNOSIS — C44712 Basal cell carcinoma of skin of right lower limb, including hip: Secondary | ICD-10-CM | POA: Diagnosis not present

## 2023-05-22 DIAGNOSIS — X32XXXA Exposure to sunlight, initial encounter: Secondary | ICD-10-CM | POA: Diagnosis not present

## 2023-05-22 DIAGNOSIS — L570 Actinic keratosis: Secondary | ICD-10-CM | POA: Diagnosis not present

## 2023-05-22 DIAGNOSIS — D225 Melanocytic nevi of trunk: Secondary | ICD-10-CM | POA: Diagnosis not present

## 2023-07-24 DIAGNOSIS — C44712 Basal cell carcinoma of skin of right lower limb, including hip: Secondary | ICD-10-CM | POA: Diagnosis not present

## 2023-07-24 DIAGNOSIS — C44729 Squamous cell carcinoma of skin of left lower limb, including hip: Secondary | ICD-10-CM | POA: Diagnosis not present

## 2023-07-24 DIAGNOSIS — D0462 Carcinoma in situ of skin of left upper limb, including shoulder: Secondary | ICD-10-CM | POA: Diagnosis not present

## 2023-11-20 DIAGNOSIS — G309 Alzheimer's disease, unspecified: Secondary | ICD-10-CM | POA: Diagnosis not present

## 2023-11-20 DIAGNOSIS — F02818 Dementia in other diseases classified elsewhere, unspecified severity, with other behavioral disturbance: Secondary | ICD-10-CM | POA: Diagnosis not present

## 2024-01-21 DIAGNOSIS — Z85828 Personal history of other malignant neoplasm of skin: Secondary | ICD-10-CM | POA: Diagnosis not present

## 2024-01-21 DIAGNOSIS — D485 Neoplasm of uncertain behavior of skin: Secondary | ICD-10-CM | POA: Diagnosis not present

## 2024-01-21 DIAGNOSIS — L578 Other skin changes due to chronic exposure to nonionizing radiation: Secondary | ICD-10-CM | POA: Diagnosis not present

## 2024-01-21 DIAGNOSIS — C44712 Basal cell carcinoma of skin of right lower limb, including hip: Secondary | ICD-10-CM | POA: Diagnosis not present

## 2024-01-21 DIAGNOSIS — L570 Actinic keratosis: Secondary | ICD-10-CM | POA: Diagnosis not present

## 2024-01-21 DIAGNOSIS — X32XXXA Exposure to sunlight, initial encounter: Secondary | ICD-10-CM | POA: Diagnosis not present

## 2024-01-21 DIAGNOSIS — Z08 Encounter for follow-up examination after completed treatment for malignant neoplasm: Secondary | ICD-10-CM | POA: Diagnosis not present

## 2024-02-14 DIAGNOSIS — D485 Neoplasm of uncertain behavior of skin: Secondary | ICD-10-CM | POA: Diagnosis not present

## 2024-03-11 DIAGNOSIS — F02818 Dementia in other diseases classified elsewhere, unspecified severity, with other behavioral disturbance: Secondary | ICD-10-CM | POA: Diagnosis not present

## 2024-03-11 DIAGNOSIS — G309 Alzheimer's disease, unspecified: Secondary | ICD-10-CM | POA: Diagnosis not present

## 2024-04-10 DIAGNOSIS — X32XXXA Exposure to sunlight, initial encounter: Secondary | ICD-10-CM | POA: Diagnosis not present

## 2024-04-10 DIAGNOSIS — L570 Actinic keratosis: Secondary | ICD-10-CM | POA: Diagnosis not present

## 2024-05-07 DIAGNOSIS — L853 Xerosis cutis: Secondary | ICD-10-CM | POA: Diagnosis not present

## 2024-05-07 DIAGNOSIS — L218 Other seborrheic dermatitis: Secondary | ICD-10-CM | POA: Diagnosis not present

## 2024-05-07 DIAGNOSIS — C44519 Basal cell carcinoma of skin of other part of trunk: Secondary | ICD-10-CM | POA: Diagnosis not present

## 2024-05-07 DIAGNOSIS — D485 Neoplasm of uncertain behavior of skin: Secondary | ICD-10-CM | POA: Diagnosis not present

## 2024-05-07 DIAGNOSIS — L578 Other skin changes due to chronic exposure to nonionizing radiation: Secondary | ICD-10-CM | POA: Diagnosis not present

## 2024-05-07 DIAGNOSIS — Z09 Encounter for follow-up examination after completed treatment for conditions other than malignant neoplasm: Secondary | ICD-10-CM | POA: Diagnosis not present

## 2024-05-22 DIAGNOSIS — F02B0 Dementia in other diseases classified elsewhere, moderate, without behavioral disturbance, psychotic disturbance, mood disturbance, and anxiety: Secondary | ICD-10-CM | POA: Diagnosis not present

## 2024-05-22 DIAGNOSIS — M549 Dorsalgia, unspecified: Secondary | ICD-10-CM | POA: Diagnosis not present

## 2024-05-22 DIAGNOSIS — Z791 Long term (current) use of non-steroidal anti-inflammatories (NSAID): Secondary | ICD-10-CM | POA: Diagnosis not present

## 2024-05-22 DIAGNOSIS — E785 Hyperlipidemia, unspecified: Secondary | ICD-10-CM | POA: Diagnosis not present

## 2024-05-22 DIAGNOSIS — G300 Alzheimer's disease with early onset: Secondary | ICD-10-CM | POA: Diagnosis not present

## 2024-05-22 DIAGNOSIS — G894 Chronic pain syndrome: Secondary | ICD-10-CM | POA: Diagnosis not present

## 2024-05-22 DIAGNOSIS — M4800 Spinal stenosis, site unspecified: Secondary | ICD-10-CM | POA: Diagnosis not present

## 2024-05-22 DIAGNOSIS — I10 Essential (primary) hypertension: Secondary | ICD-10-CM | POA: Diagnosis not present

## 2024-05-22 DIAGNOSIS — I482 Chronic atrial fibrillation, unspecified: Secondary | ICD-10-CM | POA: Diagnosis not present

## 2024-05-28 DIAGNOSIS — I10 Essential (primary) hypertension: Secondary | ICD-10-CM | POA: Diagnosis not present

## 2024-05-28 DIAGNOSIS — R488 Other symbolic dysfunctions: Secondary | ICD-10-CM | POA: Diagnosis not present

## 2024-05-28 DIAGNOSIS — E559 Vitamin D deficiency, unspecified: Secondary | ICD-10-CM | POA: Diagnosis not present

## 2024-05-28 DIAGNOSIS — R279 Unspecified lack of coordination: Secondary | ICD-10-CM | POA: Diagnosis not present

## 2024-05-28 DIAGNOSIS — R2681 Unsteadiness on feet: Secondary | ICD-10-CM | POA: Diagnosis not present

## 2024-05-28 DIAGNOSIS — D518 Other vitamin B12 deficiency anemias: Secondary | ICD-10-CM | POA: Diagnosis not present

## 2024-05-28 DIAGNOSIS — E782 Mixed hyperlipidemia: Secondary | ICD-10-CM | POA: Diagnosis not present

## 2024-05-28 DIAGNOSIS — D649 Anemia, unspecified: Secondary | ICD-10-CM | POA: Diagnosis not present

## 2024-05-28 DIAGNOSIS — R5381 Other malaise: Secondary | ICD-10-CM | POA: Diagnosis not present

## 2024-05-28 DIAGNOSIS — M6281 Muscle weakness (generalized): Secondary | ICD-10-CM | POA: Diagnosis not present

## 2024-05-28 DIAGNOSIS — R296 Repeated falls: Secondary | ICD-10-CM | POA: Diagnosis not present

## 2024-05-28 DIAGNOSIS — Z79899 Other long term (current) drug therapy: Secondary | ICD-10-CM | POA: Diagnosis not present

## 2024-05-29 DIAGNOSIS — G300 Alzheimer's disease with early onset: Secondary | ICD-10-CM | POA: Diagnosis not present

## 2024-05-29 DIAGNOSIS — F0280 Dementia in other diseases classified elsewhere without behavioral disturbance: Secondary | ICD-10-CM | POA: Diagnosis not present

## 2024-05-29 DIAGNOSIS — G479 Sleep disorder, unspecified: Secondary | ICD-10-CM | POA: Diagnosis not present

## 2024-05-29 DIAGNOSIS — I10 Essential (primary) hypertension: Secondary | ICD-10-CM | POA: Diagnosis not present

## 2024-05-30 DIAGNOSIS — R2681 Unsteadiness on feet: Secondary | ICD-10-CM | POA: Diagnosis not present

## 2024-05-30 DIAGNOSIS — M6281 Muscle weakness (generalized): Secondary | ICD-10-CM | POA: Diagnosis not present

## 2024-05-30 DIAGNOSIS — R488 Other symbolic dysfunctions: Secondary | ICD-10-CM | POA: Diagnosis not present

## 2024-05-30 DIAGNOSIS — R279 Unspecified lack of coordination: Secondary | ICD-10-CM | POA: Diagnosis not present

## 2024-05-30 DIAGNOSIS — R296 Repeated falls: Secondary | ICD-10-CM | POA: Diagnosis not present

## 2024-06-03 DIAGNOSIS — R488 Other symbolic dysfunctions: Secondary | ICD-10-CM | POA: Diagnosis not present

## 2024-06-03 DIAGNOSIS — R2681 Unsteadiness on feet: Secondary | ICD-10-CM | POA: Diagnosis not present

## 2024-06-03 DIAGNOSIS — R296 Repeated falls: Secondary | ICD-10-CM | POA: Diagnosis not present

## 2024-06-03 DIAGNOSIS — M6281 Muscle weakness (generalized): Secondary | ICD-10-CM | POA: Diagnosis not present

## 2024-06-03 DIAGNOSIS — R279 Unspecified lack of coordination: Secondary | ICD-10-CM | POA: Diagnosis not present

## 2024-06-04 DIAGNOSIS — R2681 Unsteadiness on feet: Secondary | ICD-10-CM | POA: Diagnosis not present

## 2024-06-04 DIAGNOSIS — R488 Other symbolic dysfunctions: Secondary | ICD-10-CM | POA: Diagnosis not present

## 2024-06-04 DIAGNOSIS — R296 Repeated falls: Secondary | ICD-10-CM | POA: Diagnosis not present

## 2024-06-04 DIAGNOSIS — M6281 Muscle weakness (generalized): Secondary | ICD-10-CM | POA: Diagnosis not present

## 2024-06-04 DIAGNOSIS — R279 Unspecified lack of coordination: Secondary | ICD-10-CM | POA: Diagnosis not present

## 2024-06-05 DIAGNOSIS — M6281 Muscle weakness (generalized): Secondary | ICD-10-CM | POA: Diagnosis not present

## 2024-06-05 DIAGNOSIS — R279 Unspecified lack of coordination: Secondary | ICD-10-CM | POA: Diagnosis not present

## 2024-06-05 DIAGNOSIS — R2681 Unsteadiness on feet: Secondary | ICD-10-CM | POA: Diagnosis not present

## 2024-06-05 DIAGNOSIS — R488 Other symbolic dysfunctions: Secondary | ICD-10-CM | POA: Diagnosis not present

## 2024-06-05 DIAGNOSIS — R296 Repeated falls: Secondary | ICD-10-CM | POA: Diagnosis not present

## 2024-06-06 DIAGNOSIS — R488 Other symbolic dysfunctions: Secondary | ICD-10-CM | POA: Diagnosis not present

## 2024-06-06 DIAGNOSIS — R2681 Unsteadiness on feet: Secondary | ICD-10-CM | POA: Diagnosis not present

## 2024-06-06 DIAGNOSIS — R279 Unspecified lack of coordination: Secondary | ICD-10-CM | POA: Diagnosis not present

## 2024-06-06 DIAGNOSIS — M6281 Muscle weakness (generalized): Secondary | ICD-10-CM | POA: Diagnosis not present

## 2024-06-06 DIAGNOSIS — R296 Repeated falls: Secondary | ICD-10-CM | POA: Diagnosis not present

## 2024-06-09 DIAGNOSIS — R2681 Unsteadiness on feet: Secondary | ICD-10-CM | POA: Diagnosis not present

## 2024-06-09 DIAGNOSIS — R279 Unspecified lack of coordination: Secondary | ICD-10-CM | POA: Diagnosis not present

## 2024-06-09 DIAGNOSIS — R296 Repeated falls: Secondary | ICD-10-CM | POA: Diagnosis not present

## 2024-06-09 DIAGNOSIS — R488 Other symbolic dysfunctions: Secondary | ICD-10-CM | POA: Diagnosis not present

## 2024-06-09 DIAGNOSIS — M6281 Muscle weakness (generalized): Secondary | ICD-10-CM | POA: Diagnosis not present

## 2024-06-10 DIAGNOSIS — R279 Unspecified lack of coordination: Secondary | ICD-10-CM | POA: Diagnosis not present

## 2024-06-10 DIAGNOSIS — M6281 Muscle weakness (generalized): Secondary | ICD-10-CM | POA: Diagnosis not present

## 2024-06-10 DIAGNOSIS — R2681 Unsteadiness on feet: Secondary | ICD-10-CM | POA: Diagnosis not present

## 2024-06-10 DIAGNOSIS — R296 Repeated falls: Secondary | ICD-10-CM | POA: Diagnosis not present

## 2024-06-10 DIAGNOSIS — R488 Other symbolic dysfunctions: Secondary | ICD-10-CM | POA: Diagnosis not present

## 2024-06-12 DIAGNOSIS — R279 Unspecified lack of coordination: Secondary | ICD-10-CM | POA: Diagnosis not present

## 2024-06-12 DIAGNOSIS — M6281 Muscle weakness (generalized): Secondary | ICD-10-CM | POA: Diagnosis not present

## 2024-06-12 DIAGNOSIS — R2681 Unsteadiness on feet: Secondary | ICD-10-CM | POA: Diagnosis not present

## 2024-06-12 DIAGNOSIS — R488 Other symbolic dysfunctions: Secondary | ICD-10-CM | POA: Diagnosis not present

## 2024-06-12 DIAGNOSIS — R296 Repeated falls: Secondary | ICD-10-CM | POA: Diagnosis not present

## 2024-06-16 DIAGNOSIS — R2681 Unsteadiness on feet: Secondary | ICD-10-CM | POA: Diagnosis not present

## 2024-06-16 DIAGNOSIS — R296 Repeated falls: Secondary | ICD-10-CM | POA: Diagnosis not present

## 2024-06-16 DIAGNOSIS — R279 Unspecified lack of coordination: Secondary | ICD-10-CM | POA: Diagnosis not present

## 2024-06-16 DIAGNOSIS — M6281 Muscle weakness (generalized): Secondary | ICD-10-CM | POA: Diagnosis not present

## 2024-06-16 DIAGNOSIS — R488 Other symbolic dysfunctions: Secondary | ICD-10-CM | POA: Diagnosis not present

## 2024-06-17 DIAGNOSIS — R279 Unspecified lack of coordination: Secondary | ICD-10-CM | POA: Diagnosis not present

## 2024-06-17 DIAGNOSIS — M6281 Muscle weakness (generalized): Secondary | ICD-10-CM | POA: Diagnosis not present

## 2024-06-17 DIAGNOSIS — R488 Other symbolic dysfunctions: Secondary | ICD-10-CM | POA: Diagnosis not present

## 2024-06-17 DIAGNOSIS — R296 Repeated falls: Secondary | ICD-10-CM | POA: Diagnosis not present

## 2024-06-17 DIAGNOSIS — R2681 Unsteadiness on feet: Secondary | ICD-10-CM | POA: Diagnosis not present

## 2024-06-18 DIAGNOSIS — R2681 Unsteadiness on feet: Secondary | ICD-10-CM | POA: Diagnosis not present

## 2024-06-18 DIAGNOSIS — R279 Unspecified lack of coordination: Secondary | ICD-10-CM | POA: Diagnosis not present

## 2024-06-18 DIAGNOSIS — R488 Other symbolic dysfunctions: Secondary | ICD-10-CM | POA: Diagnosis not present

## 2024-06-18 DIAGNOSIS — R296 Repeated falls: Secondary | ICD-10-CM | POA: Diagnosis not present

## 2024-06-18 DIAGNOSIS — M6281 Muscle weakness (generalized): Secondary | ICD-10-CM | POA: Diagnosis not present

## 2024-06-19 DIAGNOSIS — I10 Essential (primary) hypertension: Secondary | ICD-10-CM | POA: Diagnosis not present

## 2024-06-19 DIAGNOSIS — G300 Alzheimer's disease with early onset: Secondary | ICD-10-CM | POA: Diagnosis not present

## 2024-06-19 DIAGNOSIS — R600 Localized edema: Secondary | ICD-10-CM | POA: Diagnosis not present

## 2024-06-19 DIAGNOSIS — F0280 Dementia in other diseases classified elsewhere without behavioral disturbance: Secondary | ICD-10-CM | POA: Diagnosis not present

## 2024-06-20 DIAGNOSIS — R279 Unspecified lack of coordination: Secondary | ICD-10-CM | POA: Diagnosis not present

## 2024-06-20 DIAGNOSIS — R488 Other symbolic dysfunctions: Secondary | ICD-10-CM | POA: Diagnosis not present

## 2024-06-20 DIAGNOSIS — R2681 Unsteadiness on feet: Secondary | ICD-10-CM | POA: Diagnosis not present

## 2024-06-20 DIAGNOSIS — R296 Repeated falls: Secondary | ICD-10-CM | POA: Diagnosis not present

## 2024-06-20 DIAGNOSIS — M6281 Muscle weakness (generalized): Secondary | ICD-10-CM | POA: Diagnosis not present

## 2024-06-23 DIAGNOSIS — R488 Other symbolic dysfunctions: Secondary | ICD-10-CM | POA: Diagnosis not present

## 2024-06-23 DIAGNOSIS — R2681 Unsteadiness on feet: Secondary | ICD-10-CM | POA: Diagnosis not present

## 2024-06-23 DIAGNOSIS — R279 Unspecified lack of coordination: Secondary | ICD-10-CM | POA: Diagnosis not present

## 2024-06-23 DIAGNOSIS — M6281 Muscle weakness (generalized): Secondary | ICD-10-CM | POA: Diagnosis not present

## 2024-06-23 DIAGNOSIS — R296 Repeated falls: Secondary | ICD-10-CM | POA: Diagnosis not present

## 2024-06-24 DIAGNOSIS — R296 Repeated falls: Secondary | ICD-10-CM | POA: Diagnosis not present

## 2024-06-24 DIAGNOSIS — R2681 Unsteadiness on feet: Secondary | ICD-10-CM | POA: Diagnosis not present

## 2024-06-24 DIAGNOSIS — R279 Unspecified lack of coordination: Secondary | ICD-10-CM | POA: Diagnosis not present

## 2024-06-25 DIAGNOSIS — R296 Repeated falls: Secondary | ICD-10-CM | POA: Diagnosis not present

## 2024-06-25 DIAGNOSIS — R2681 Unsteadiness on feet: Secondary | ICD-10-CM | POA: Diagnosis not present

## 2024-06-25 DIAGNOSIS — R279 Unspecified lack of coordination: Secondary | ICD-10-CM | POA: Diagnosis not present

## 2024-06-26 DIAGNOSIS — R279 Unspecified lack of coordination: Secondary | ICD-10-CM | POA: Diagnosis not present

## 2024-06-26 DIAGNOSIS — R2681 Unsteadiness on feet: Secondary | ICD-10-CM | POA: Diagnosis not present

## 2024-06-26 DIAGNOSIS — R296 Repeated falls: Secondary | ICD-10-CM | POA: Diagnosis not present

## 2024-06-27 DIAGNOSIS — R2681 Unsteadiness on feet: Secondary | ICD-10-CM | POA: Diagnosis not present

## 2024-06-27 DIAGNOSIS — R296 Repeated falls: Secondary | ICD-10-CM | POA: Diagnosis not present

## 2024-06-27 DIAGNOSIS — R279 Unspecified lack of coordination: Secondary | ICD-10-CM | POA: Diagnosis not present

## 2024-07-01 DIAGNOSIS — R279 Unspecified lack of coordination: Secondary | ICD-10-CM | POA: Diagnosis not present

## 2024-07-01 DIAGNOSIS — R296 Repeated falls: Secondary | ICD-10-CM | POA: Diagnosis not present

## 2024-07-01 DIAGNOSIS — R2681 Unsteadiness on feet: Secondary | ICD-10-CM | POA: Diagnosis not present

## 2024-07-02 DIAGNOSIS — R296 Repeated falls: Secondary | ICD-10-CM | POA: Diagnosis not present

## 2024-07-02 DIAGNOSIS — R2681 Unsteadiness on feet: Secondary | ICD-10-CM | POA: Diagnosis not present

## 2024-07-02 DIAGNOSIS — R279 Unspecified lack of coordination: Secondary | ICD-10-CM | POA: Diagnosis not present

## 2024-07-03 DIAGNOSIS — R296 Repeated falls: Secondary | ICD-10-CM | POA: Diagnosis not present

## 2024-07-03 DIAGNOSIS — R2681 Unsteadiness on feet: Secondary | ICD-10-CM | POA: Diagnosis not present

## 2024-07-03 DIAGNOSIS — R279 Unspecified lack of coordination: Secondary | ICD-10-CM | POA: Diagnosis not present

## 2024-07-04 DIAGNOSIS — R279 Unspecified lack of coordination: Secondary | ICD-10-CM | POA: Diagnosis not present

## 2024-07-04 DIAGNOSIS — R2681 Unsteadiness on feet: Secondary | ICD-10-CM | POA: Diagnosis not present

## 2024-07-04 DIAGNOSIS — R296 Repeated falls: Secondary | ICD-10-CM | POA: Diagnosis not present

## 2024-07-08 DIAGNOSIS — R2681 Unsteadiness on feet: Secondary | ICD-10-CM | POA: Diagnosis not present

## 2024-07-08 DIAGNOSIS — R279 Unspecified lack of coordination: Secondary | ICD-10-CM | POA: Diagnosis not present

## 2024-07-08 DIAGNOSIS — R296 Repeated falls: Secondary | ICD-10-CM | POA: Diagnosis not present

## 2024-07-09 DIAGNOSIS — R296 Repeated falls: Secondary | ICD-10-CM | POA: Diagnosis not present

## 2024-07-09 DIAGNOSIS — R279 Unspecified lack of coordination: Secondary | ICD-10-CM | POA: Diagnosis not present

## 2024-07-09 DIAGNOSIS — R2681 Unsteadiness on feet: Secondary | ICD-10-CM | POA: Diagnosis not present

## 2024-07-10 DIAGNOSIS — M549 Dorsalgia, unspecified: Secondary | ICD-10-CM | POA: Diagnosis not present

## 2024-07-10 DIAGNOSIS — R2681 Unsteadiness on feet: Secondary | ICD-10-CM | POA: Diagnosis not present

## 2024-07-10 DIAGNOSIS — R2689 Other abnormalities of gait and mobility: Secondary | ICD-10-CM | POA: Diagnosis not present

## 2024-07-10 DIAGNOSIS — F0280 Dementia in other diseases classified elsewhere without behavioral disturbance: Secondary | ICD-10-CM | POA: Diagnosis not present

## 2024-07-10 DIAGNOSIS — R279 Unspecified lack of coordination: Secondary | ICD-10-CM | POA: Diagnosis not present

## 2024-07-10 DIAGNOSIS — G300 Alzheimer's disease with early onset: Secondary | ICD-10-CM | POA: Diagnosis not present

## 2024-07-10 DIAGNOSIS — G8929 Other chronic pain: Secondary | ICD-10-CM | POA: Diagnosis not present

## 2024-07-10 DIAGNOSIS — R296 Repeated falls: Secondary | ICD-10-CM | POA: Diagnosis not present

## 2024-07-14 DIAGNOSIS — R279 Unspecified lack of coordination: Secondary | ICD-10-CM | POA: Diagnosis not present

## 2024-07-14 DIAGNOSIS — R2681 Unsteadiness on feet: Secondary | ICD-10-CM | POA: Diagnosis not present

## 2024-07-14 DIAGNOSIS — R296 Repeated falls: Secondary | ICD-10-CM | POA: Diagnosis not present

## 2024-07-15 DIAGNOSIS — R2681 Unsteadiness on feet: Secondary | ICD-10-CM | POA: Diagnosis not present

## 2024-07-15 DIAGNOSIS — R279 Unspecified lack of coordination: Secondary | ICD-10-CM | POA: Diagnosis not present

## 2024-07-15 DIAGNOSIS — R296 Repeated falls: Secondary | ICD-10-CM | POA: Diagnosis not present

## 2024-07-16 DIAGNOSIS — R2681 Unsteadiness on feet: Secondary | ICD-10-CM | POA: Diagnosis not present

## 2024-07-16 DIAGNOSIS — R279 Unspecified lack of coordination: Secondary | ICD-10-CM | POA: Diagnosis not present

## 2024-07-16 DIAGNOSIS — R296 Repeated falls: Secondary | ICD-10-CM | POA: Diagnosis not present

## 2024-07-17 DIAGNOSIS — R296 Repeated falls: Secondary | ICD-10-CM | POA: Diagnosis not present

## 2024-07-17 DIAGNOSIS — R2681 Unsteadiness on feet: Secondary | ICD-10-CM | POA: Diagnosis not present

## 2024-07-17 DIAGNOSIS — R279 Unspecified lack of coordination: Secondary | ICD-10-CM | POA: Diagnosis not present

## 2024-07-18 DIAGNOSIS — R2681 Unsteadiness on feet: Secondary | ICD-10-CM | POA: Diagnosis not present

## 2024-07-18 DIAGNOSIS — R279 Unspecified lack of coordination: Secondary | ICD-10-CM | POA: Diagnosis not present

## 2024-07-18 DIAGNOSIS — R296 Repeated falls: Secondary | ICD-10-CM | POA: Diagnosis not present

## 2024-07-21 DIAGNOSIS — R296 Repeated falls: Secondary | ICD-10-CM | POA: Diagnosis not present

## 2024-07-21 DIAGNOSIS — R2681 Unsteadiness on feet: Secondary | ICD-10-CM | POA: Diagnosis not present

## 2024-07-21 DIAGNOSIS — R279 Unspecified lack of coordination: Secondary | ICD-10-CM | POA: Diagnosis not present

## 2024-07-22 DIAGNOSIS — R279 Unspecified lack of coordination: Secondary | ICD-10-CM | POA: Diagnosis not present

## 2024-07-22 DIAGNOSIS — R296 Repeated falls: Secondary | ICD-10-CM | POA: Diagnosis not present

## 2024-07-22 DIAGNOSIS — R2681 Unsteadiness on feet: Secondary | ICD-10-CM | POA: Diagnosis not present

## 2024-07-23 DIAGNOSIS — R296 Repeated falls: Secondary | ICD-10-CM | POA: Diagnosis not present

## 2024-07-23 DIAGNOSIS — R2681 Unsteadiness on feet: Secondary | ICD-10-CM | POA: Diagnosis not present

## 2024-07-23 DIAGNOSIS — R279 Unspecified lack of coordination: Secondary | ICD-10-CM | POA: Diagnosis not present

## 2024-07-24 DIAGNOSIS — R2681 Unsteadiness on feet: Secondary | ICD-10-CM | POA: Diagnosis not present

## 2024-07-24 DIAGNOSIS — R279 Unspecified lack of coordination: Secondary | ICD-10-CM | POA: Diagnosis not present

## 2024-07-24 DIAGNOSIS — R296 Repeated falls: Secondary | ICD-10-CM | POA: Diagnosis not present

## 2024-07-28 DIAGNOSIS — R296 Repeated falls: Secondary | ICD-10-CM | POA: Diagnosis not present

## 2024-07-28 DIAGNOSIS — R279 Unspecified lack of coordination: Secondary | ICD-10-CM | POA: Diagnosis not present

## 2024-07-28 DIAGNOSIS — R2681 Unsteadiness on feet: Secondary | ICD-10-CM | POA: Diagnosis not present

## 2024-07-29 DIAGNOSIS — R2681 Unsteadiness on feet: Secondary | ICD-10-CM | POA: Diagnosis not present

## 2024-07-29 DIAGNOSIS — R296 Repeated falls: Secondary | ICD-10-CM | POA: Diagnosis not present

## 2024-07-29 DIAGNOSIS — R279 Unspecified lack of coordination: Secondary | ICD-10-CM | POA: Diagnosis not present

## 2024-07-30 DIAGNOSIS — R279 Unspecified lack of coordination: Secondary | ICD-10-CM | POA: Diagnosis not present

## 2024-07-30 DIAGNOSIS — R2681 Unsteadiness on feet: Secondary | ICD-10-CM | POA: Diagnosis not present

## 2024-07-30 DIAGNOSIS — R296 Repeated falls: Secondary | ICD-10-CM | POA: Diagnosis not present

## 2024-07-31 DIAGNOSIS — R296 Repeated falls: Secondary | ICD-10-CM | POA: Diagnosis not present

## 2024-07-31 DIAGNOSIS — R279 Unspecified lack of coordination: Secondary | ICD-10-CM | POA: Diagnosis not present

## 2024-07-31 DIAGNOSIS — R2681 Unsteadiness on feet: Secondary | ICD-10-CM | POA: Diagnosis not present

## 2024-08-01 DIAGNOSIS — R279 Unspecified lack of coordination: Secondary | ICD-10-CM | POA: Diagnosis not present

## 2024-08-01 DIAGNOSIS — R2681 Unsteadiness on feet: Secondary | ICD-10-CM | POA: Diagnosis not present

## 2024-08-01 DIAGNOSIS — R296 Repeated falls: Secondary | ICD-10-CM | POA: Diagnosis not present

## 2024-08-04 DIAGNOSIS — R279 Unspecified lack of coordination: Secondary | ICD-10-CM | POA: Diagnosis not present

## 2024-08-04 DIAGNOSIS — R296 Repeated falls: Secondary | ICD-10-CM | POA: Diagnosis not present

## 2024-08-04 DIAGNOSIS — R2681 Unsteadiness on feet: Secondary | ICD-10-CM | POA: Diagnosis not present

## 2024-08-06 DIAGNOSIS — R2681 Unsteadiness on feet: Secondary | ICD-10-CM | POA: Diagnosis not present

## 2024-08-06 DIAGNOSIS — R279 Unspecified lack of coordination: Secondary | ICD-10-CM | POA: Diagnosis not present

## 2024-08-06 DIAGNOSIS — R296 Repeated falls: Secondary | ICD-10-CM | POA: Diagnosis not present

## 2024-08-06 DIAGNOSIS — R5381 Other malaise: Secondary | ICD-10-CM | POA: Diagnosis not present

## 2024-08-06 DIAGNOSIS — I10 Essential (primary) hypertension: Secondary | ICD-10-CM | POA: Diagnosis not present

## 2024-08-07 DIAGNOSIS — R296 Repeated falls: Secondary | ICD-10-CM | POA: Diagnosis not present

## 2024-08-07 DIAGNOSIS — R279 Unspecified lack of coordination: Secondary | ICD-10-CM | POA: Diagnosis not present

## 2024-08-07 DIAGNOSIS — R2681 Unsteadiness on feet: Secondary | ICD-10-CM | POA: Diagnosis not present

## 2024-08-08 DIAGNOSIS — R2681 Unsteadiness on feet: Secondary | ICD-10-CM | POA: Diagnosis not present

## 2024-08-08 DIAGNOSIS — R279 Unspecified lack of coordination: Secondary | ICD-10-CM | POA: Diagnosis not present

## 2024-08-08 DIAGNOSIS — R296 Repeated falls: Secondary | ICD-10-CM | POA: Diagnosis not present

## 2024-08-11 DIAGNOSIS — R279 Unspecified lack of coordination: Secondary | ICD-10-CM | POA: Diagnosis not present

## 2024-08-11 DIAGNOSIS — R2681 Unsteadiness on feet: Secondary | ICD-10-CM | POA: Diagnosis not present

## 2024-08-11 DIAGNOSIS — R296 Repeated falls: Secondary | ICD-10-CM | POA: Diagnosis not present

## 2024-08-13 DIAGNOSIS — R296 Repeated falls: Secondary | ICD-10-CM | POA: Diagnosis not present

## 2024-08-13 DIAGNOSIS — R279 Unspecified lack of coordination: Secondary | ICD-10-CM | POA: Diagnosis not present

## 2024-08-13 DIAGNOSIS — R2681 Unsteadiness on feet: Secondary | ICD-10-CM | POA: Diagnosis not present

## 2024-08-14 DIAGNOSIS — R296 Repeated falls: Secondary | ICD-10-CM | POA: Diagnosis not present

## 2024-08-14 DIAGNOSIS — R2681 Unsteadiness on feet: Secondary | ICD-10-CM | POA: Diagnosis not present

## 2024-08-14 DIAGNOSIS — R279 Unspecified lack of coordination: Secondary | ICD-10-CM | POA: Diagnosis not present

## 2024-08-18 DIAGNOSIS — R296 Repeated falls: Secondary | ICD-10-CM | POA: Diagnosis not present

## 2024-08-18 DIAGNOSIS — R279 Unspecified lack of coordination: Secondary | ICD-10-CM | POA: Diagnosis not present

## 2024-08-18 DIAGNOSIS — R2681 Unsteadiness on feet: Secondary | ICD-10-CM | POA: Diagnosis not present

## 2024-08-20 DIAGNOSIS — R2681 Unsteadiness on feet: Secondary | ICD-10-CM | POA: Diagnosis not present

## 2024-08-20 DIAGNOSIS — R279 Unspecified lack of coordination: Secondary | ICD-10-CM | POA: Diagnosis not present

## 2024-08-20 DIAGNOSIS — R296 Repeated falls: Secondary | ICD-10-CM | POA: Diagnosis not present

## 2024-08-20 DIAGNOSIS — D649 Anemia, unspecified: Secondary | ICD-10-CM | POA: Diagnosis not present

## 2024-08-21 DIAGNOSIS — R296 Repeated falls: Secondary | ICD-10-CM | POA: Diagnosis not present

## 2024-08-21 DIAGNOSIS — R279 Unspecified lack of coordination: Secondary | ICD-10-CM | POA: Diagnosis not present

## 2024-08-21 DIAGNOSIS — R2681 Unsteadiness on feet: Secondary | ICD-10-CM | POA: Diagnosis not present

## 2024-08-26 DIAGNOSIS — R279 Unspecified lack of coordination: Secondary | ICD-10-CM | POA: Diagnosis not present

## 2024-08-29 DIAGNOSIS — R296 Repeated falls: Secondary | ICD-10-CM | POA: Diagnosis not present

## 2024-08-29 DIAGNOSIS — R279 Unspecified lack of coordination: Secondary | ICD-10-CM | POA: Diagnosis not present

## 2024-09-11 DIAGNOSIS — R2689 Other abnormalities of gait and mobility: Secondary | ICD-10-CM | POA: Diagnosis not present

## 2024-09-11 DIAGNOSIS — I872 Venous insufficiency (chronic) (peripheral): Secondary | ICD-10-CM | POA: Diagnosis not present

## 2024-09-11 DIAGNOSIS — G300 Alzheimer's disease with early onset: Secondary | ICD-10-CM | POA: Diagnosis not present

## 2024-09-11 DIAGNOSIS — F0280 Dementia in other diseases classified elsewhere without behavioral disturbance: Secondary | ICD-10-CM | POA: Diagnosis not present

## 2024-09-11 DIAGNOSIS — R600 Localized edema: Secondary | ICD-10-CM | POA: Diagnosis not present

## 2024-09-18 DIAGNOSIS — R635 Abnormal weight gain: Secondary | ICD-10-CM | POA: Diagnosis not present

## 2024-09-18 DIAGNOSIS — G300 Alzheimer's disease with early onset: Secondary | ICD-10-CM | POA: Diagnosis not present

## 2024-09-18 DIAGNOSIS — Z6823 Body mass index (BMI) 23.0-23.9, adult: Secondary | ICD-10-CM | POA: Diagnosis not present

## 2024-09-18 DIAGNOSIS — R600 Localized edema: Secondary | ICD-10-CM | POA: Diagnosis not present

## 2024-09-18 DIAGNOSIS — I872 Venous insufficiency (chronic) (peripheral): Secondary | ICD-10-CM | POA: Diagnosis not present

## 2024-09-18 DIAGNOSIS — F0280 Dementia in other diseases classified elsewhere without behavioral disturbance: Secondary | ICD-10-CM | POA: Diagnosis not present

## 2024-10-01 DIAGNOSIS — D485 Neoplasm of uncertain behavior of skin: Secondary | ICD-10-CM | POA: Diagnosis not present

## 2024-10-01 DIAGNOSIS — I872 Venous insufficiency (chronic) (peripheral): Secondary | ICD-10-CM | POA: Diagnosis not present

## 2024-10-01 DIAGNOSIS — R601 Generalized edema: Secondary | ICD-10-CM | POA: Diagnosis not present

## 2024-10-01 DIAGNOSIS — R600 Localized edema: Secondary | ICD-10-CM | POA: Diagnosis not present

## 2024-10-01 DIAGNOSIS — L98491 Non-pressure chronic ulcer of skin of other sites limited to breakdown of skin: Secondary | ICD-10-CM | POA: Diagnosis not present

## 2024-10-02 DIAGNOSIS — G300 Alzheimer's disease with early onset: Secondary | ICD-10-CM | POA: Diagnosis not present

## 2024-10-02 DIAGNOSIS — F0280 Dementia in other diseases classified elsewhere without behavioral disturbance: Secondary | ICD-10-CM | POA: Diagnosis not present

## 2024-10-02 DIAGNOSIS — R600 Localized edema: Secondary | ICD-10-CM | POA: Diagnosis not present

## 2024-10-02 DIAGNOSIS — I872 Venous insufficiency (chronic) (peripheral): Secondary | ICD-10-CM | POA: Diagnosis not present

## 2024-10-09 DIAGNOSIS — R600 Localized edema: Secondary | ICD-10-CM | POA: Diagnosis not present

## 2024-10-09 DIAGNOSIS — R601 Generalized edema: Secondary | ICD-10-CM | POA: Diagnosis not present

## 2024-10-09 DIAGNOSIS — G300 Alzheimer's disease with early onset: Secondary | ICD-10-CM | POA: Diagnosis not present

## 2024-10-09 DIAGNOSIS — F0280 Dementia in other diseases classified elsewhere without behavioral disturbance: Secondary | ICD-10-CM | POA: Diagnosis not present

## 2024-10-14 DIAGNOSIS — R601 Generalized edema: Secondary | ICD-10-CM | POA: Diagnosis not present

## 2024-10-14 DIAGNOSIS — F0280 Dementia in other diseases classified elsewhere without behavioral disturbance: Secondary | ICD-10-CM | POA: Diagnosis not present

## 2024-10-14 DIAGNOSIS — R600 Localized edema: Secondary | ICD-10-CM | POA: Diagnosis not present

## 2024-10-14 DIAGNOSIS — G300 Alzheimer's disease with early onset: Secondary | ICD-10-CM | POA: Diagnosis not present

## 2024-10-14 DIAGNOSIS — I872 Venous insufficiency (chronic) (peripheral): Secondary | ICD-10-CM | POA: Diagnosis not present

## 2024-10-15 DIAGNOSIS — N189 Chronic kidney disease, unspecified: Secondary | ICD-10-CM | POA: Diagnosis not present

## 2024-10-27 DIAGNOSIS — I480 Paroxysmal atrial fibrillation: Secondary | ICD-10-CM | POA: Diagnosis not present

## 2024-10-27 DIAGNOSIS — G309 Alzheimer's disease, unspecified: Secondary | ICD-10-CM | POA: Diagnosis not present

## 2024-10-27 DIAGNOSIS — R41841 Cognitive communication deficit: Secondary | ICD-10-CM | POA: Diagnosis not present

## 2024-10-27 DIAGNOSIS — R1312 Dysphagia, oropharyngeal phase: Secondary | ICD-10-CM | POA: Diagnosis not present

## 2024-10-27 DIAGNOSIS — R2681 Unsteadiness on feet: Secondary | ICD-10-CM | POA: Diagnosis not present

## 2024-10-27 DIAGNOSIS — M6281 Muscle weakness (generalized): Secondary | ICD-10-CM | POA: Diagnosis not present

## 2024-10-28 DIAGNOSIS — R601 Generalized edema: Secondary | ICD-10-CM | POA: Diagnosis not present

## 2024-10-28 DIAGNOSIS — R1312 Dysphagia, oropharyngeal phase: Secondary | ICD-10-CM | POA: Diagnosis not present

## 2024-10-28 DIAGNOSIS — I872 Venous insufficiency (chronic) (peripheral): Secondary | ICD-10-CM | POA: Diagnosis not present

## 2024-10-28 DIAGNOSIS — R2681 Unsteadiness on feet: Secondary | ICD-10-CM | POA: Diagnosis not present

## 2024-10-28 DIAGNOSIS — G309 Alzheimer's disease, unspecified: Secondary | ICD-10-CM | POA: Diagnosis not present

## 2024-10-28 DIAGNOSIS — M6281 Muscle weakness (generalized): Secondary | ICD-10-CM | POA: Diagnosis not present

## 2024-10-28 DIAGNOSIS — R600 Localized edema: Secondary | ICD-10-CM | POA: Diagnosis not present

## 2024-10-28 DIAGNOSIS — R41841 Cognitive communication deficit: Secondary | ICD-10-CM | POA: Diagnosis not present

## 2024-10-28 DIAGNOSIS — I480 Paroxysmal atrial fibrillation: Secondary | ICD-10-CM | POA: Diagnosis not present

## 2024-10-29 DIAGNOSIS — R2681 Unsteadiness on feet: Secondary | ICD-10-CM | POA: Diagnosis not present

## 2024-10-29 DIAGNOSIS — I480 Paroxysmal atrial fibrillation: Secondary | ICD-10-CM | POA: Diagnosis not present

## 2024-10-29 DIAGNOSIS — R1312 Dysphagia, oropharyngeal phase: Secondary | ICD-10-CM | POA: Diagnosis not present

## 2024-10-29 DIAGNOSIS — M6281 Muscle weakness (generalized): Secondary | ICD-10-CM | POA: Diagnosis not present

## 2024-10-29 DIAGNOSIS — R41841 Cognitive communication deficit: Secondary | ICD-10-CM | POA: Diagnosis not present

## 2024-10-29 DIAGNOSIS — G309 Alzheimer's disease, unspecified: Secondary | ICD-10-CM | POA: Diagnosis not present

## 2024-10-30 DIAGNOSIS — R1312 Dysphagia, oropharyngeal phase: Secondary | ICD-10-CM | POA: Diagnosis not present

## 2024-10-30 DIAGNOSIS — R2681 Unsteadiness on feet: Secondary | ICD-10-CM | POA: Diagnosis not present

## 2024-10-30 DIAGNOSIS — I480 Paroxysmal atrial fibrillation: Secondary | ICD-10-CM | POA: Diagnosis not present

## 2024-10-30 DIAGNOSIS — G309 Alzheimer's disease, unspecified: Secondary | ICD-10-CM | POA: Diagnosis not present

## 2024-10-30 DIAGNOSIS — M6281 Muscle weakness (generalized): Secondary | ICD-10-CM | POA: Diagnosis not present

## 2024-10-30 DIAGNOSIS — R41841 Cognitive communication deficit: Secondary | ICD-10-CM | POA: Diagnosis not present

## 2024-10-31 DIAGNOSIS — I480 Paroxysmal atrial fibrillation: Secondary | ICD-10-CM | POA: Diagnosis not present

## 2024-10-31 DIAGNOSIS — G309 Alzheimer's disease, unspecified: Secondary | ICD-10-CM | POA: Diagnosis not present

## 2024-10-31 DIAGNOSIS — R41841 Cognitive communication deficit: Secondary | ICD-10-CM | POA: Diagnosis not present

## 2024-10-31 DIAGNOSIS — R2681 Unsteadiness on feet: Secondary | ICD-10-CM | POA: Diagnosis not present

## 2024-10-31 DIAGNOSIS — M6281 Muscle weakness (generalized): Secondary | ICD-10-CM | POA: Diagnosis not present

## 2024-10-31 DIAGNOSIS — R1312 Dysphagia, oropharyngeal phase: Secondary | ICD-10-CM | POA: Diagnosis not present

## 2024-11-03 DIAGNOSIS — R2681 Unsteadiness on feet: Secondary | ICD-10-CM | POA: Diagnosis not present

## 2024-11-03 DIAGNOSIS — I480 Paroxysmal atrial fibrillation: Secondary | ICD-10-CM | POA: Diagnosis not present

## 2024-11-03 DIAGNOSIS — R41841 Cognitive communication deficit: Secondary | ICD-10-CM | POA: Diagnosis not present

## 2024-11-03 DIAGNOSIS — G309 Alzheimer's disease, unspecified: Secondary | ICD-10-CM | POA: Diagnosis not present

## 2024-11-03 DIAGNOSIS — M6281 Muscle weakness (generalized): Secondary | ICD-10-CM | POA: Diagnosis not present

## 2024-11-03 DIAGNOSIS — R1312 Dysphagia, oropharyngeal phase: Secondary | ICD-10-CM | POA: Diagnosis not present

## 2024-11-04 DIAGNOSIS — R41841 Cognitive communication deficit: Secondary | ICD-10-CM | POA: Diagnosis not present

## 2024-11-04 DIAGNOSIS — R1312 Dysphagia, oropharyngeal phase: Secondary | ICD-10-CM | POA: Diagnosis not present

## 2024-11-04 DIAGNOSIS — G309 Alzheimer's disease, unspecified: Secondary | ICD-10-CM | POA: Diagnosis not present

## 2024-11-04 DIAGNOSIS — R2681 Unsteadiness on feet: Secondary | ICD-10-CM | POA: Diagnosis not present

## 2024-11-04 DIAGNOSIS — M6281 Muscle weakness (generalized): Secondary | ICD-10-CM | POA: Diagnosis not present

## 2024-11-04 DIAGNOSIS — I482 Chronic atrial fibrillation, unspecified: Secondary | ICD-10-CM | POA: Diagnosis not present

## 2024-11-04 DIAGNOSIS — R601 Generalized edema: Secondary | ICD-10-CM | POA: Diagnosis not present

## 2024-11-04 DIAGNOSIS — I480 Paroxysmal atrial fibrillation: Secondary | ICD-10-CM | POA: Diagnosis not present

## 2024-11-04 DIAGNOSIS — I872 Venous insufficiency (chronic) (peripheral): Secondary | ICD-10-CM | POA: Diagnosis not present

## 2024-11-05 DIAGNOSIS — R41841 Cognitive communication deficit: Secondary | ICD-10-CM | POA: Diagnosis not present

## 2024-11-05 DIAGNOSIS — M6281 Muscle weakness (generalized): Secondary | ICD-10-CM | POA: Diagnosis not present

## 2024-11-05 DIAGNOSIS — I480 Paroxysmal atrial fibrillation: Secondary | ICD-10-CM | POA: Diagnosis not present

## 2024-11-05 DIAGNOSIS — R1312 Dysphagia, oropharyngeal phase: Secondary | ICD-10-CM | POA: Diagnosis not present

## 2024-11-05 DIAGNOSIS — G309 Alzheimer's disease, unspecified: Secondary | ICD-10-CM | POA: Diagnosis not present

## 2024-11-05 DIAGNOSIS — R2681 Unsteadiness on feet: Secondary | ICD-10-CM | POA: Diagnosis not present

## 2024-11-06 DIAGNOSIS — G309 Alzheimer's disease, unspecified: Secondary | ICD-10-CM | POA: Diagnosis not present

## 2024-11-06 DIAGNOSIS — I480 Paroxysmal atrial fibrillation: Secondary | ICD-10-CM | POA: Diagnosis not present

## 2024-11-06 DIAGNOSIS — M6281 Muscle weakness (generalized): Secondary | ICD-10-CM | POA: Diagnosis not present

## 2024-11-06 DIAGNOSIS — R2681 Unsteadiness on feet: Secondary | ICD-10-CM | POA: Diagnosis not present

## 2024-11-06 DIAGNOSIS — R1312 Dysphagia, oropharyngeal phase: Secondary | ICD-10-CM | POA: Diagnosis not present

## 2024-11-06 DIAGNOSIS — R41841 Cognitive communication deficit: Secondary | ICD-10-CM | POA: Diagnosis not present

## 2024-11-07 DIAGNOSIS — R2681 Unsteadiness on feet: Secondary | ICD-10-CM | POA: Diagnosis not present

## 2024-11-07 DIAGNOSIS — I480 Paroxysmal atrial fibrillation: Secondary | ICD-10-CM | POA: Diagnosis not present

## 2024-11-07 DIAGNOSIS — G309 Alzheimer's disease, unspecified: Secondary | ICD-10-CM | POA: Diagnosis not present

## 2024-11-07 DIAGNOSIS — R1312 Dysphagia, oropharyngeal phase: Secondary | ICD-10-CM | POA: Diagnosis not present

## 2024-11-07 DIAGNOSIS — M6281 Muscle weakness (generalized): Secondary | ICD-10-CM | POA: Diagnosis not present

## 2024-11-07 DIAGNOSIS — R41841 Cognitive communication deficit: Secondary | ICD-10-CM | POA: Diagnosis not present

## 2024-11-13 DIAGNOSIS — M6281 Muscle weakness (generalized): Secondary | ICD-10-CM | POA: Diagnosis not present

## 2024-11-13 DIAGNOSIS — Z7901 Long term (current) use of anticoagulants: Secondary | ICD-10-CM | POA: Diagnosis not present

## 2024-11-13 DIAGNOSIS — R1312 Dysphagia, oropharyngeal phase: Secondary | ICD-10-CM | POA: Diagnosis not present

## 2024-11-13 DIAGNOSIS — G8929 Other chronic pain: Secondary | ICD-10-CM | POA: Diagnosis not present

## 2024-11-13 DIAGNOSIS — D649 Anemia, unspecified: Secondary | ICD-10-CM | POA: Diagnosis not present

## 2024-11-13 DIAGNOSIS — R2681 Unsteadiness on feet: Secondary | ICD-10-CM | POA: Diagnosis not present

## 2024-11-13 DIAGNOSIS — I482 Chronic atrial fibrillation, unspecified: Secondary | ICD-10-CM | POA: Diagnosis not present

## 2024-11-13 DIAGNOSIS — F0280 Dementia in other diseases classified elsewhere without behavioral disturbance: Secondary | ICD-10-CM | POA: Diagnosis not present

## 2024-11-13 DIAGNOSIS — I5033 Acute on chronic diastolic (congestive) heart failure: Secondary | ICD-10-CM | POA: Diagnosis not present

## 2024-11-13 DIAGNOSIS — I480 Paroxysmal atrial fibrillation: Secondary | ICD-10-CM | POA: Diagnosis not present

## 2024-11-13 DIAGNOSIS — R41841 Cognitive communication deficit: Secondary | ICD-10-CM | POA: Diagnosis not present

## 2024-11-13 DIAGNOSIS — G309 Alzheimer's disease, unspecified: Secondary | ICD-10-CM | POA: Diagnosis not present

## 2024-11-13 DIAGNOSIS — I110 Hypertensive heart disease with heart failure: Secondary | ICD-10-CM | POA: Diagnosis not present

## 2024-11-14 DIAGNOSIS — G309 Alzheimer's disease, unspecified: Secondary | ICD-10-CM | POA: Diagnosis not present

## 2024-11-14 DIAGNOSIS — R2681 Unsteadiness on feet: Secondary | ICD-10-CM | POA: Diagnosis not present

## 2024-11-14 DIAGNOSIS — M6281 Muscle weakness (generalized): Secondary | ICD-10-CM | POA: Diagnosis not present

## 2024-11-14 DIAGNOSIS — I480 Paroxysmal atrial fibrillation: Secondary | ICD-10-CM | POA: Diagnosis not present

## 2024-11-14 DIAGNOSIS — R1312 Dysphagia, oropharyngeal phase: Secondary | ICD-10-CM | POA: Diagnosis not present

## 2024-11-14 DIAGNOSIS — R41841 Cognitive communication deficit: Secondary | ICD-10-CM | POA: Diagnosis not present

## 2024-11-17 DIAGNOSIS — R2681 Unsteadiness on feet: Secondary | ICD-10-CM | POA: Diagnosis not present

## 2024-11-17 DIAGNOSIS — M6281 Muscle weakness (generalized): Secondary | ICD-10-CM | POA: Diagnosis not present

## 2024-11-17 DIAGNOSIS — R1312 Dysphagia, oropharyngeal phase: Secondary | ICD-10-CM | POA: Diagnosis not present

## 2024-11-17 DIAGNOSIS — I480 Paroxysmal atrial fibrillation: Secondary | ICD-10-CM | POA: Diagnosis not present

## 2024-11-17 DIAGNOSIS — G309 Alzheimer's disease, unspecified: Secondary | ICD-10-CM | POA: Diagnosis not present

## 2024-11-17 DIAGNOSIS — R41841 Cognitive communication deficit: Secondary | ICD-10-CM | POA: Diagnosis not present

## 2024-11-18 DIAGNOSIS — R41841 Cognitive communication deficit: Secondary | ICD-10-CM | POA: Diagnosis not present

## 2024-11-18 DIAGNOSIS — M6281 Muscle weakness (generalized): Secondary | ICD-10-CM | POA: Diagnosis not present

## 2024-11-18 DIAGNOSIS — G309 Alzheimer's disease, unspecified: Secondary | ICD-10-CM | POA: Diagnosis not present

## 2024-11-18 DIAGNOSIS — R1312 Dysphagia, oropharyngeal phase: Secondary | ICD-10-CM | POA: Diagnosis not present

## 2024-11-18 DIAGNOSIS — R2681 Unsteadiness on feet: Secondary | ICD-10-CM | POA: Diagnosis not present

## 2024-11-18 DIAGNOSIS — I480 Paroxysmal atrial fibrillation: Secondary | ICD-10-CM | POA: Diagnosis not present

## 2024-11-19 DIAGNOSIS — R2681 Unsteadiness on feet: Secondary | ICD-10-CM | POA: Diagnosis not present

## 2024-11-19 DIAGNOSIS — I480 Paroxysmal atrial fibrillation: Secondary | ICD-10-CM | POA: Diagnosis not present

## 2024-11-19 DIAGNOSIS — R1312 Dysphagia, oropharyngeal phase: Secondary | ICD-10-CM | POA: Diagnosis not present

## 2024-11-19 DIAGNOSIS — G309 Alzheimer's disease, unspecified: Secondary | ICD-10-CM | POA: Diagnosis not present

## 2024-11-19 DIAGNOSIS — R41841 Cognitive communication deficit: Secondary | ICD-10-CM | POA: Diagnosis not present

## 2024-11-19 DIAGNOSIS — M6281 Muscle weakness (generalized): Secondary | ICD-10-CM | POA: Diagnosis not present

## 2024-11-21 DIAGNOSIS — G309 Alzheimer's disease, unspecified: Secondary | ICD-10-CM | POA: Diagnosis not present

## 2024-11-21 DIAGNOSIS — R1312 Dysphagia, oropharyngeal phase: Secondary | ICD-10-CM | POA: Diagnosis not present

## 2024-11-21 DIAGNOSIS — R2681 Unsteadiness on feet: Secondary | ICD-10-CM | POA: Diagnosis not present

## 2024-11-21 DIAGNOSIS — R41841 Cognitive communication deficit: Secondary | ICD-10-CM | POA: Diagnosis not present

## 2024-11-21 DIAGNOSIS — M6281 Muscle weakness (generalized): Secondary | ICD-10-CM | POA: Diagnosis not present

## 2024-11-21 DIAGNOSIS — I480 Paroxysmal atrial fibrillation: Secondary | ICD-10-CM | POA: Diagnosis not present

## 2024-11-27 DIAGNOSIS — I5033 Acute on chronic diastolic (congestive) heart failure: Secondary | ICD-10-CM | POA: Diagnosis not present

## 2024-11-27 DIAGNOSIS — Z7901 Long term (current) use of anticoagulants: Secondary | ICD-10-CM | POA: Diagnosis not present

## 2024-11-27 DIAGNOSIS — D649 Anemia, unspecified: Secondary | ICD-10-CM | POA: Diagnosis not present

## 2024-11-27 DIAGNOSIS — I872 Venous insufficiency (chronic) (peripheral): Secondary | ICD-10-CM | POA: Diagnosis not present

## 2024-11-27 DIAGNOSIS — I482 Chronic atrial fibrillation, unspecified: Secondary | ICD-10-CM | POA: Diagnosis not present

## 2024-12-04 DIAGNOSIS — I5022 Chronic systolic (congestive) heart failure: Secondary | ICD-10-CM | POA: Diagnosis not present

## 2024-12-04 DIAGNOSIS — D649 Anemia, unspecified: Secondary | ICD-10-CM | POA: Diagnosis not present

## 2024-12-04 DIAGNOSIS — Z7901 Long term (current) use of anticoagulants: Secondary | ICD-10-CM | POA: Diagnosis not present

## 2024-12-04 DIAGNOSIS — I482 Chronic atrial fibrillation, unspecified: Secondary | ICD-10-CM | POA: Diagnosis not present

## 2024-12-10 DIAGNOSIS — I509 Heart failure, unspecified: Secondary | ICD-10-CM | POA: Diagnosis not present
